# Patient Record
Sex: Female | Born: 1980
Health system: Southern US, Community
[De-identification: ages and names within clinical notes are randomized; demographics above are authoritative.]

## PROBLEM LIST (undated history)

## (undated) DIAGNOSIS — C801 Malignant (primary) neoplasm, unspecified: Secondary | ICD-10-CM

## (undated) DIAGNOSIS — F419 Anxiety disorder, unspecified: Secondary | ICD-10-CM

## (undated) DIAGNOSIS — O139 Gestational [pregnancy-induced] hypertension without significant proteinuria, unspecified trimester: Secondary | ICD-10-CM

## (undated) DIAGNOSIS — F329 Major depressive disorder, single episode, unspecified: Secondary | ICD-10-CM

## (undated) DIAGNOSIS — N200 Calculus of kidney: Secondary | ICD-10-CM

## (undated) DIAGNOSIS — F32A Depression, unspecified: Secondary | ICD-10-CM

## (undated) DIAGNOSIS — J302 Other seasonal allergic rhinitis: Secondary | ICD-10-CM

## (undated) HISTORY — DX: Calculus of kidney: N20.0

## (undated) HISTORY — DX: Depression, unspecified: F32.A

## (undated) HISTORY — DX: Major depressive disorder, single episode, unspecified: F32.9

## (undated) HISTORY — DX: Depression, unspecified: F41.9

## (undated) HISTORY — DX: Malignant (primary) neoplasm, unspecified: C80.1

## (undated) HISTORY — DX: Other seasonal allergic rhinitis: J30.2

---

## 2003-01-12 ENCOUNTER — Other Ambulatory Visit: Admission: RE | Admit: 2003-01-12 | Discharge: 2003-01-12 | Payer: Self-pay | Admitting: Obstetrics and Gynecology

## 2003-09-27 ENCOUNTER — Other Ambulatory Visit: Admission: RE | Admit: 2003-09-27 | Discharge: 2003-09-27 | Payer: Self-pay | Admitting: Obstetrics and Gynecology

## 2004-02-27 ENCOUNTER — Other Ambulatory Visit: Admission: RE | Admit: 2004-02-27 | Discharge: 2004-02-27 | Payer: Self-pay | Admitting: Obstetrics and Gynecology

## 2005-04-23 ENCOUNTER — Other Ambulatory Visit: Admission: RE | Admit: 2005-04-23 | Discharge: 2005-04-23 | Payer: Self-pay | Admitting: Obstetrics and Gynecology

## 2006-03-08 ENCOUNTER — Other Ambulatory Visit: Admission: RE | Admit: 2006-03-08 | Discharge: 2006-03-08 | Payer: Self-pay | Admitting: Obstetrics & Gynecology

## 2006-04-29 ENCOUNTER — Emergency Department (HOSPITAL_COMMUNITY): Admission: EM | Admit: 2006-04-29 | Discharge: 2006-04-29 | Payer: Self-pay | Admitting: Family Medicine

## 2009-02-26 LAB — CONVERTED CEMR LAB

## 2010-01-31 ENCOUNTER — Ambulatory Visit: Payer: Self-pay | Admitting: Internal Medicine

## 2010-01-31 DIAGNOSIS — Z87442 Personal history of urinary calculi: Secondary | ICD-10-CM | POA: Insufficient documentation

## 2010-01-31 DIAGNOSIS — J309 Allergic rhinitis, unspecified: Secondary | ICD-10-CM | POA: Insufficient documentation

## 2010-01-31 DIAGNOSIS — R10814 Left lower quadrant abdominal tenderness: Secondary | ICD-10-CM | POA: Insufficient documentation

## 2010-01-31 LAB — CONVERTED CEMR LAB
ALT: 17 units/L (ref 0–35)
AST: 22 units/L (ref 0–37)
Albumin: 3.9 g/dL (ref 3.5–5.2)
Alkaline Phosphatase: 58 units/L (ref 39–117)
BUN: 18 mg/dL (ref 6–23)
Basophils Absolute: 0 10*3/uL (ref 0.0–0.1)
Basophils Relative: 0.3 % (ref 0.0–3.0)
Bilirubin Urine: NEGATIVE
Bilirubin, Direct: 0 mg/dL (ref 0.0–0.3)
CO2: 28 meq/L (ref 19–32)
Calcium: 9.4 mg/dL (ref 8.4–10.5)
Chloride: 105 meq/L (ref 96–112)
Cholesterol: 169 mg/dL (ref 0–200)
Creatinine, Ser: 0.9 mg/dL (ref 0.4–1.2)
Eosinophils Absolute: 0.1 10*3/uL (ref 0.0–0.7)
Eosinophils Relative: 1.2 % (ref 0.0–5.0)
GFR calc non Af Amer: 83.71 mL/min (ref >60)
Glucose, Bld: 86 mg/dL (ref 70–99)
HCT: 40.6 % (ref 36.0–46.0)
HDL: 78.9 mg/dL (ref >39.00)
Hemoglobin: 14.4 g/dL (ref 12.0–15.0)
Ketones, ur: NEGATIVE mg/dL
LDL Cholesterol: 71 mg/dL (ref 0–99)
Leukocytes, UA: NEGATIVE
Lymphocytes Relative: 19.9 % (ref 12.0–46.0)
Lymphs Abs: 1.6 10*3/uL (ref 0.7–4.0)
MCHC: 35.4 g/dL (ref 30.0–36.0)
MCV: 88.7 fL (ref 78.0–100.0)
Monocytes Absolute: 0.5 10*3/uL (ref 0.1–1.0)
Monocytes Relative: 6.6 % (ref 3.0–12.0)
Neutro Abs: 5.9 10*3/uL (ref 1.4–7.7)
Neutrophils Relative %: 72 % (ref 43.0–77.0)
Nitrite: NEGATIVE
Platelets: 200 10*3/uL (ref 150.0–400.0)
Potassium: 4.5 meq/L (ref 3.5–5.1)
RBC: 4.58 M/uL (ref 3.87–5.11)
RDW: 12.5 % (ref 11.5–14.6)
Sodium: 140 meq/L (ref 135–145)
Specific Gravity, Urine: 1.025 (ref 1.000–1.030)
TSH: 1.32 microintl units/mL (ref 0.35–5.50)
Total Bilirubin: 0.3 mg/dL (ref 0.3–1.2)
Total CHOL/HDL Ratio: 2
Total Protein, Urine: NEGATIVE mg/dL
Total Protein: 7.4 g/dL (ref 6.0–8.3)
Triglycerides: 96 mg/dL (ref 0.0–149.0)
Urine Glucose: NEGATIVE mg/dL
Urobilinogen, UA: 0.2 (ref 0.0–1.0)
VLDL: 19.2 mg/dL (ref 0.0–40.0)
WBC: 8.2 10*3/uL (ref 4.5–10.5)
pH: 6 (ref 5.0–8.0)

## 2010-02-05 ENCOUNTER — Encounter: Payer: Self-pay | Admitting: Internal Medicine

## 2010-05-16 ENCOUNTER — Encounter: Admission: RE | Admit: 2010-05-16 | Discharge: 2010-05-16 | Payer: Self-pay | Admitting: General Surgery

## 2010-07-29 ENCOUNTER — Ambulatory Visit: Payer: Self-pay | Admitting: Internal Medicine

## 2010-07-29 ENCOUNTER — Telehealth: Payer: Self-pay | Admitting: Internal Medicine

## 2010-07-29 DIAGNOSIS — M25539 Pain in unspecified wrist: Secondary | ICD-10-CM | POA: Insufficient documentation

## 2010-07-29 LAB — CONVERTED CEMR LAB: Sed Rate: 10 mm/hr (ref 0–22)

## 2010-07-30 ENCOUNTER — Ambulatory Visit: Payer: Self-pay | Admitting: Internal Medicine

## 2010-07-30 LAB — CONVERTED CEMR LAB: Rhuematoid fact SerPl-aCnc: <20 intl units/mL (ref 0–20)

## 2010-08-19 ENCOUNTER — Encounter: Payer: Self-pay | Admitting: Internal Medicine

## 2010-10-28 ENCOUNTER — Encounter: Payer: Self-pay | Admitting: Internal Medicine

## 2010-10-30 NOTE — Letter (Signed)
Summary: [facility]   Imported By: Lester Marmaduke 02/17/2010 11:18:26  _____________________________________________________________________  External Attachment:    Type:   Image     Comment:   External Document

## 2010-10-30 NOTE — Assessment & Plan Note (Signed)
Summary: ARTHRITIC PAIN/#/CD   Vital Signs:  Patient profile:   30 year old female Height:      62 inches (157.48 cm) Weight:      137.4 pounds (62.45 kg) O2 Sat:      97 % on Room air Temp:     98.7 degrees F (37.06 degrees C) oral Pulse rate:   86 / minute BP sitting:   110 / 78  (left arm) Cuff size:   regular  Vitals Entered By: Orlan Leavens RMA (July 29, 2010 9:17 AM)  O2 Flow:  Room air CC: Hands & feet pain Is Patient Diabetic? No Pain Assessment Patient in pain? yes     Location: handf & feet Type: aching Onset of pain  Pt states since August been having ongoing pain in her hans & feet. Have family history of RA want to be check   Primary Care Provider:  Newt Lukes MD  CC:  Hands & feet pain.  History of Present Illness: c/o B feet pain and wrist R>L (right handed) no hx trauma or injury to any of these areas onset 3 months ago - pain worse in AM x 30 min, then spont resolve desribes as sore all over, not esp due to weight bearing status or activity no swelling or redness but "numb and weak" feeling +FH AS and RA - would like labs to check for same  also c/o sores on nostrils - occurs approx once per month -  last episode 2 weeks ago - none at this time using nasal spray for allg w/o change - no other nose medications  Current Medications (verified): 1)  Allergy Injections .... Take 1 Shot Q Week 2)  Bupropion Hcl 100 Mg Tabs (Bupropion Hcl) .... Take 1/2 By Mouth Once Daily 3)  Yaz 3-0.02 Mg Tabs (Drospirenone-Ethinyl Estradiol) .... Take 1 By Mouth Qd 4)  Flonase 50 Mcg/act Susp (Fluticasone Propionate) .Marland Kitchen.. 1 Spray Each Nostril Qd 5)  Prozac 10 Mg Caps (Fluoxetine Hcl) .... 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    Physical Exam  General:  alert, well-developed, well-nourished, and cooperative to examination.    Msk:  no deformities of B hands/wrists or ankles/toes - no arthritis changes, synovitis or redness - nontender to palp - FROM in all joints without pain - neurovasc intact   Impression & Recommendations:  Problem # 1:  WRIST PAIN, RIGHT (ICD-719.43) right handed but pain symptoms affects both wrists and both feet pain each AM x 3 mo with +FH RA and AS (mom with wrist fusion in 40s due to AS) no trauma or obvious abn on exam - check xray and labs now - if these normal, suggest wrist plpint at bedtime for poss carpal tunnel - likewise, feet symptoms likely plantar fascitis -  to call if symptoms worse depsite tx for other eval as needed - pt understands and agrees to same Orders: T-Wrist Comp Right (73110TC) TLB-Sedimentation Rate (  ESR) (85652-ESR) T-Rheumatoid Factor (51884-16606)  Problem # 2:  ALLERGIC RHINITIS (ICD-477.9) no nasal ulcertion or sores at this time - ?dry MM - rec nasal saline and to call if problems while lesion prsent Her updated medication list for this problem includes:    Flonase 50 Mcg/act Susp (Fluticasone propionate) .Marland Kitchen... 1 spray each nostril qd  Complete Medication List: 1)  Allergy Injections  .... Take 1 shot q week 2)  Bupropion Hcl 100 Mg Tabs (Bupropion hcl) .... Take 1/2 by mouth once daily 3)  Yaz 3-0.02 Mg Tabs (Drospirenone-ethinyl estradiol) .... Take 1 by mouth qd 4)  Flonase 50 Mcg/act Susp (Fluticasone propionate) .Marland Kitchen.. 1 spray each nostril qd 5)  Prozac 10 Mg Caps (Fluoxetine hcl) ....  Take 1 by mouth qd  Patient Instructions: 1)  it was good to see you today. 2)  test(s) ordered today - your results will becalled to you after review in 48-72 hours from the time of test completion 3)  if labs and xrays without obvious abnormality, try wrist splint at bedtime as dicsussed for possible carpal tunnel syndrome symptoms  4)  if continued pain despite same, call for reevaluation as needed 5)  use nasal saline for nose sores (to keep moist) in addition to allergy medication spray 6)  Please schedule a follow-up appointment as needed.   Orders Added: 1)  T-Wrist Comp Right [73110TC] 2)  TLB-Sedimentation Rate (ESR) [85652-ESR] 3)  T-Rheumatoid Factor [30160-10932] 4)  Est. Patient Level IV [35573]

## 2010-10-30 NOTE — Progress Notes (Signed)
Summary: lab orders  Phone Note Outgoing Call   Call placed by: Orlan Leavens RMA,  July 29, 2010 11:04 AM Call placed to: pt Details for Reason: FYI Summary of Call: Contacted lab to see if pt did get Rheumatoid factor labs drawn, per Gerarda Gunther only did SED. Called pt to let her know she will have to come back in to have  lab for rheumatoid factor drawn. Pt states ok will try to come back later or in the am. Put order in IDX Initial call taken by: Orlan Leavens RMA,  July 29, 2010 11:07 AM  Follow-up for Phone Call        noted - thanks Follow-up by: Newt Lukes MD,  July 29, 2010 12:15 PM

## 2010-10-30 NOTE — Letter (Signed)
Summary: Consuela Mimes MD  Consuela Mimes MD   Imported By: Lennie Odor 09/03/2010 15:38:57  _____________________________________________________________________  External Attachment:    Type:   Image     Comment:   External Document

## 2010-10-30 NOTE — Assessment & Plan Note (Signed)
Summary: new / bcbs / # / cd   Vital Signs:  Patient profile:   30 year old female Height:      62 inches (157.48 cm) Weight:      128.12 pounds (58.24 kg) BMI:     23.52 O2 Sat:      95 % on Room air Temp:     98.7 degrees F (37.06 degrees C) oral Pulse rate:   78 / minute BP sitting:   120 / 88  (left arm) Cuff size:   regular  Vitals Entered By: Orlan Leavens (Jan 31, 2010 8:11 AM)  O2 Flow:  Room air CC: New patient Is Patient Diabetic? No Pain Assessment Patient in pain? no        Primary Care Provider:  Newt Lukes MD  CC:  New patient.  History of Present Illness: new pt to me and our practice, here to est care  also, patient is here today for annual physical. Patient feels well and has no complaints.      Preventive Screening-Counseling & Management  Alcohol-Tobacco     Alcohol drinks/day: <1     Alcohol Counseling: not indicated; use of alcohol is not excessive or problematic     Smoking Status: never     Tobacco Counseling: not indicated; no tobacco use  Caffeine-Diet-Exercise     Nutrition Referrals: no     Does Patient Exercise: yes     Exercise Counseling: not indicated; exercise is adequate     Depression Counseling: not indicated; screening negative for depression  Safety-Violence-Falls     Seat Belt Counseling: not indicated; patient wears seat belts     Helmet Counseling: not indicated; patient wears helmet when riding bicycle/motocycle     Firearm Counseling: not applicable     Smoke Detectors: yes     Violence Counseling: not indicated; no violence risk noted     Fall Risk Counseling: not indicated; no significant falls noted  Clinical Review Panels:  Prevention   Last Pap Smear:  Interpretation/ Result:Negative for intraepithelial Lesion or Malignancy.    (02/26/2009)  Immunizations   Last Tetanus Booster:  Historical (09/29/2007)   Last Flu Vaccine:  Historical (06/28/2009)   Current Medications (verified): 1)  Allergy  Injections .... Take 1 Shot Q Week 2)  Bupropion Hcl 100 Mg Tabs (Bupropion Hcl) .... Take 1 By Mouth Qd 3)  Yaz 3-0.02 Mg Tabs (Drospirenone-Ethinyl Estradiol) .... Take 1 By Mouth Qd 4)  Flonase 50 Mcg/act Susp (Fluticasone Propionate) .Marland Kitchen.. 1 Spray Each Nostril Qd 5)  Prozac 10 Mg Caps (Fluoxetine Hcl) .... 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 otherwise, see HPI above. I have reviewed all other systems and they were negative.   Physical Exam  General:  alert, well-developed, well-nourished, and cooperative to examination.    Eyes:  vision grossly intact; pupils equal, round and reactive to light.  conjunctiva and lids normal.    Ears:  normal pinnae bilaterally, without erythema, swelling, or tenderness to palpation. TMs clear, without effusion, or cerumen impaction. Hearing grossly normal bilaterally  Mouth:  teeth and gums in good repair; mucous membranes moist, without lesions or ulcers. oropharynx clear without exudate, no erythema.  Neck:  supple, full ROM, no masses, no thyromegaly; no thyroid nodules or tenderness. no JVD or carotid bruits.   Lungs:  normal respiratory effort, no intercostal retractions or use of accessory muscles; normal breath sounds bilaterally - no crackles and no wheezes.    Heart:  normal rate, regular rhythm, no murmur, and no  rub. BLE without edema. normal DP pulses and normal cap refill in all 4 extremities    Abdomen:  soft, non-tender, normal bowel sounds, no distention; no masses and no appreciable hepatomegaly or splenomegaly.   Genitalia:  defer to gyn Msk:  No deformity noted - ?mild scoli with prominenet right scapular wing  -    Neurologic:  alert & oriented X3 and cranial nerves II-XII symetrically intact.  strength normal in all extremities, sensation intact to light touch, and gait normal. speech fluent without dysarthria or aphasia; follows commands with good comprehension.  Skin:  no rashes, vesicles, ulcers, or erythema. No nodules or irregularity to palpation.  Psych:  Oriented X3, memory intact for recent and remote, normally interactive, good eye contact, not anxious appearing, not depressed appearing, and not agitated.      Impression & Recommendations:  Problem # 1:  PREVENTIVE HEALTH CARE (ICD-V70.0)  Patient has been counseled on age-appropriate routine health concerns for screening and prevention. These are reviewed and up-to-date. Immunizations are up-to-date or declined. Labs ordered to be reviewed.  refer for gyn eval  Orders: TLB-Lipid Panel (80061-LIPID) TLB-BMP (Basic Metabolic Panel-BMET) (80048-METABOL) TLB-CBC Platelet - w/Differential (85025-CBCD) TLB-Hepatic/Liver Function Pnl (80076-HEPATIC) TLB-TSH (Thyroid Stimulating Hormone) (84443-TSH) TLB-Udip w/ Micro (81001-URINE) Gynecologic Referral (Gyn)  Problem # 2:  ABDOMINAL TENDERNESS, LEFT LOWER QUADRANT (BZJ-696.78) await CPX labs and gyn evla - consider need for pelvic US  Problem # 3:  DEPRESSION (ICD-311) currently seeing counselor but will rx med refills as needed  Her updated medication list for this problem includes:    Bupropion Hcl 100 Mg Tabs (Bupropion hcl) .Marland Kitchen... Take 1 by mouth qd    Prozac 10 Mg Caps (Fluoxetine hcl) .Marland Kitchen... Take 1 by mouth qd  Complete Medication List: 1)  Allergy Injections  .... Take 1  shot q week 2)  Bupropion Hcl 100 Mg Tabs (Bupropion hcl) .... Take 1 by mouth qd 3)  Yaz 3-0.02 Mg Tabs (Drospirenone-ethinyl estradiol) .... Take 1 by mouth qd 4)  Flonase 50 Mcg/act Susp (Fluticasone propionate) .Marland Kitchen.. 1 spray each nostril qd 5)  Prozac 10 Mg Caps (Fluoxetine hcl) .... Take 1 by mouth qd  Patient Instructions: 1)  it was good to see you today. 2)  test(s) ordered today - your results will be posted on the phone tree for review in 48-72 hours from the time of test completion; call [phone] and enter your 9 digit MRN (listed above on this page, just below your name); if any changes need to be made or there are abnormal results, you will be contacted directly.  3)  we'll make referral to gynecology for left ovary symptoms. Our office will contact you regarding this appointment once made.  4)  Let us know when/if refills on medications are needed 5)  Please schedule a follow-up appointment in 6-12 months to  monitor depression medications, call sooner if problems.    Pap Smear  Procedure date:  02/26/2009  Findings:      Interpretation/ Result:Negative for intraepithelial Lesion or Malignancy.       Immunization History:  Tetanus/Td Immunization History:    Tetanus/Td:  historical (09/29/2007)  Influenza Immunization History:    Influenza:  historical (06/28/2009)

## 2010-11-13 NOTE — Letter (Signed)
Summary: Anselmo Rod MD  Anselmo Rod MD   Imported By: [place] 11/07/2010 15:25:33  _____________________________________________________________________  External Attachment:    Type:   Image     Comment:   External Document

## 2010-12-27 ENCOUNTER — Encounter: Payer: Self-pay | Admitting: Internal Medicine

## 2010-12-31 ENCOUNTER — Other Ambulatory Visit (INDEPENDENT_AMBULATORY_CARE_PROVIDER_SITE_OTHER): Payer: BC Managed Care – PPO

## 2010-12-31 ENCOUNTER — Encounter: Payer: Self-pay | Admitting: Internal Medicine

## 2010-12-31 ENCOUNTER — Telehealth: Payer: Self-pay | Admitting: Internal Medicine

## 2010-12-31 ENCOUNTER — Ambulatory Visit (INDEPENDENT_AMBULATORY_CARE_PROVIDER_SITE_OTHER): Payer: BC Managed Care – PPO | Admitting: Internal Medicine

## 2010-12-31 VITALS — BP 98/72 | HR 73 | Temp 98.2°F | Ht 62.0 in | Wt 138.0 lb

## 2010-12-31 DIAGNOSIS — R109 Unspecified abdominal pain: Secondary | ICD-10-CM

## 2010-12-31 DIAGNOSIS — Z23 Encounter for immunization: Secondary | ICD-10-CM

## 2010-12-31 DIAGNOSIS — R10A1 Flank pain, right side: Secondary | ICD-10-CM

## 2010-12-31 LAB — BASIC METABOLIC PANEL
BUN: 17 mg/dL (ref 6–23)
CO2: 30 mEq/L (ref 19–32)
Calcium: 9.7 mg/dL (ref 8.4–10.5)
Chloride: 102 mEq/L (ref 96–112)
Creatinine, Ser: 0.8 mg/dL (ref 0.4–1.2)
GFR: 87.95 mL/min (ref >60.00)
Glucose, Bld: 77 mg/dL (ref 70–99)
Potassium: 4.1 mEq/L (ref 3.5–5.1)
Sodium: 140 mEq/L (ref 135–145)

## 2010-12-31 LAB — URINALYSIS
Bilirubin Urine: NEGATIVE
Hgb urine dipstick: NEGATIVE
Ketones, ur: NEGATIVE
Leukocytes, UA: NEGATIVE
Nitrite: NEGATIVE
Specific Gravity, Urine: 1.01 (ref 1.000–1.030)
Total Protein, Urine: NEGATIVE
Urine Glucose: NEGATIVE
Urobilinogen, UA: 0.2 (ref 0.0–1.0)
pH: 7 (ref 5.0–8.0)

## 2010-12-31 LAB — CBC WITH DIFFERENTIAL/PLATELET
Basophils Absolute: 0 10*3/uL (ref 0.0–0.1)
Basophils Relative: 0.4 % (ref 0.0–3.0)
Eosinophils Absolute: 0.1 10*3/uL (ref 0.0–0.7)
Eosinophils Relative: 2.3 % (ref 0.0–5.0)
HCT: 40.3 % (ref 36.0–46.0)
Hemoglobin: 13.9 g/dL (ref 12.0–15.0)
Lymphocytes Relative: 36.8 % (ref 12.0–46.0)
Lymphs Abs: 1.8 10*3/uL (ref 0.7–4.0)
MCHC: 34.6 g/dL (ref 30.0–36.0)
MCV: 90.5 fl (ref 78.0–100.0)
Monocytes Absolute: 0.5 10*3/uL (ref 0.1–1.0)
Monocytes Relative: 9.6 % (ref 3.0–12.0)
Neutro Abs: 2.4 10*3/uL (ref 1.4–7.7)
Neutrophils Relative %: 50.9 % (ref 43.0–77.0)
Platelets: 172 10*3/uL (ref 150.0–400.0)
RBC: 4.45 Mil/uL (ref 3.87–5.11)
RDW: 12.6 % (ref 11.5–14.6)
WBC: 4.8 10*3/uL (ref 4.5–10.5)

## 2010-12-31 MED ORDER — IBUPROFEN 200 MG PO TABS: 400.0000 mg | ORAL_TABLET | Freq: Four times a day (QID) | ORAL | Status: AC | PRN

## 2010-12-31 NOTE — Telephone Encounter (Signed)
Called pt no ansew left msg on personal cell md response concerning labs...12/31/10@3 :22pm/LMB

## 2010-12-31 NOTE — Telephone Encounter (Signed)
Please call patient - normal results, no blood in urine or evidence for infx  - Await CT results and final UCx, will call again once those results reviewed. No medication changes recommended. Thanks.

## 2010-12-31 NOTE — Progress Notes (Signed)
Subjective:    Patient ID: Alison [name], female    DOB: [birth year], 30 y.o.   MRN: [id number]  HPI the patient complains of R flank pain Onset 6 weeks ago No radiation of pain No precipitating injury, overuse or trauma +hx same associated with with kidney stone Seen in urg care at time of onset due to severity of pain - blood in urine, presumed kidney stone Improved pain with ibuprofen but not resolved symptoms worse with inactivity and laying in bed No weakness, no bowel change, no fever, no dysuria or hematuria  Past Medical History  Diagnosis Date  . DEPRESSION 01/31/2010  . ALLERGIC RHINITIS 01/31/2010  . RENAL CALCULUS, HX OF 01/31/2010   Review of Systems  Constitutional: Negative for fever, chills and fatigue.  Respiratory: Negative for cough.   Cardiovascular: Negative for chest pain.  Genitourinary: Positive for flank pain. Negative for dysuria, frequency, hematuria, difficulty urinating and menstrual problem.      BP 98/72  Pulse 73  Temp(Src) 98.2 F (36.8 C) (Oral)  Ht 5\' 2"  (1.575 m)  Wt 138 lb (62.596 kg)  BMI 25.24 kg/m2  Objective:   Physical Exam  Constitutional: She appears well-developed and well-nourished. No distress.  Cardiovascular: Normal rate, regular rhythm and normal heart sounds.  Exam reveals no friction rub.   No murmur heard. Pulmonary/Chest: Effort normal and breath sounds normal. No respiratory distress. She has no wheezes. She exhibits no tenderness.  Abdominal: Soft. Bowel sounds are normal. She exhibits no distension. There is no tenderness.  Musculoskeletal:       Back: full range of motion of thoracic and lumbar spine. Min tender to palpation over R flank. Negative straight leg raise. DTR's are symmetrically intact. Sensation intact in all dermatomes of the lower extremities. Full strength to manual muscle testing including the EHL, anterior tibialis, gastrocnemius, quadricepts, and iliopsoas. the patient is able to heel toe walk without  difficulty and ambulates with a normal gait.      Lab Results  Component Value Date   WBC 8.2 01/31/2010   HGB 14.4 01/31/2010   HGB TRACE-INTACT 01/31/2010   HCT 40.6 01/31/2010   PLT 200.0 01/31/2010   CHOL 169 01/31/2010   TRIG 96.0 01/31/2010   HDL 78.90 01/31/2010   ALT 17 01/31/2010   AST 22 01/31/2010   NA 140 01/31/2010   K 4.5 01/31/2010   CL 105 01/31/2010   CREATININE 0.9 01/31/2010   BUN 18 01/31/2010   CO2 28 01/31/2010   TSH 1.32 01/31/2010   Assessment & Plan:  See problem list. Medications and labs reviewed today.

## 2010-12-31 NOTE — Patient Instructions (Addendum)
It was good to see you today. Test(s) ordered today. Your results will be called to you after review (48-72hours after test completion). If any changes need to be made, you will be notified at that time. we'll make referral for CT scan to look for kidney stones or other problems. Our office will contact you regarding appointment(s) once made. Alternate between ibuprofen and tylenol for aches and pain symptoms as discussed: Dose yourself every 4-6 hours using 400-600 mg ibuprofen alternating with 1000 mg acetaminophen  Tdap given today

## 2010-12-31 NOTE — Assessment & Plan Note (Signed)
Pain symptoms >6weeks - prior kidney stone and recent hematuria Check UA/Ucx and CT now r/o infx or stone or other problem Continue ibuprofen pending clarification of other possible dx

## 2011-01-01 LAB — URINE CULTURE
Colony Count: NO GROWTH
Organism ID, Bacteria: NO GROWTH

## 2011-01-05 ENCOUNTER — Telehealth: Payer: Self-pay | Admitting: Internal Medicine

## 2011-01-05 ENCOUNTER — Ambulatory Visit (INDEPENDENT_AMBULATORY_CARE_PROVIDER_SITE_OTHER)
Admission: RE | Admit: 2011-01-05 | Discharge: 2011-01-05 | Disposition: A | Discharge status: Home / Self Care | Payer: Federal, State, Local not specified - PPO | Source: Ambulatory Visit | Attending: Internal Medicine | Admitting: Internal Medicine

## 2011-01-05 DIAGNOSIS — R109 Unspecified abdominal pain: Secondary | ICD-10-CM

## 2011-01-05 NOTE — Assessment & Plan Note (Signed)
No stone of abnormality on ct a/p -

## 2011-01-05 NOTE — Telephone Encounter (Signed)
Please call patient - normal CT results - no stone or back or kidney problems evident. No medication changes recommended. Call if recurrent or worsening pain symptoms - Thanks.

## 2011-01-05 NOTE — Telephone Encounter (Signed)
Pt Notified with CT results...01/05/11@11 :54am/LMB

## 2011-05-08 LAB — GC/CHLAMYDIA PROBE AMP, GENITAL
Chlamydia: NEGATIVE
Gonorrhea: NEGATIVE

## 2011-05-08 LAB — HIV ANTIBODY (ROUTINE TESTING W REFLEX): HIV: NONREACTIVE

## 2011-05-08 LAB — STREP B DNA PROBE: GBS: NEGATIVE

## 2011-05-08 LAB — RPR: RPR: NONREACTIVE

## 2011-05-08 LAB — RUBELLA ANTIBODY, IGM: Rubella: IMMUNE

## 2011-05-08 LAB — ANTIBODY SCREEN: Antibody Screen: NEGATIVE

## 2011-05-08 LAB — VARICELLA ZOSTER ANTIBODY, IGG: Varicella: IMMUNE

## 2011-05-08 LAB — ABO/RH: RH Type: POSITIVE

## 2011-05-08 LAB — HEPATITIS B SURFACE ANTIGEN: Hepatitis B Surface Ag: NEGATIVE

## 2011-09-29 NOTE — L&D Delivery Note (Signed)
Delivery Note   C/C/+2 at 2008 Onset of active second stage at same time.  FHR cat I thorough 2nd stage  At 8:57 PM a viable female was delivered via Vaginal, Spontaneous Delivery (Presentation: OA to ROT ;  ).  APGAR: 6, 9; weight 7 lb 15.7 oz (3620 g).   Placenta status: Intact, Spontaneous, to pathology for gest HTN.   Cord: 3 vessels with the following complications: nuchal cord loose x 1, reduced .  Cord pH: none  Anesthesia: Epidural  Episiotomy: None Lacerations: 2nd degree;Perineal, bilateral labial Suture Repair: 3.0 vicryl rapide and 4.0 vicryl Est. Blood Loss (mL): 350  Mom to postpartum.  Baby to mother.  Alison [name] 12/26/2011, 9:42 PM

## 2011-11-27 DIAGNOSIS — O139 Gestational [pregnancy-induced] hypertension without significant proteinuria, unspecified trimester: Secondary | ICD-10-CM

## 2011-11-27 HISTORY — DX: Gestational (pregnancy-induced) hypertension without significant proteinuria, unspecified trimester: O13.9

## 2011-12-22 ENCOUNTER — Inpatient Hospital Stay (HOSPITAL_COMMUNITY)
Admission: AD | Admit: 2011-12-22 | Payer: Federal, State, Local not specified - PPO | Source: Ambulatory Visit | Admitting: Obstetrics and Gynecology

## 2011-12-26 ENCOUNTER — Inpatient Hospital Stay (HOSPITAL_COMMUNITY): Payer: Federal, State, Local not specified - PPO | Admitting: Anesthesiology

## 2011-12-26 ENCOUNTER — Encounter (HOSPITAL_COMMUNITY): Payer: Self-pay | Admitting: Anesthesiology

## 2011-12-26 ENCOUNTER — Encounter (HOSPITAL_COMMUNITY): Payer: Self-pay

## 2011-12-26 ENCOUNTER — Inpatient Hospital Stay (HOSPITAL_COMMUNITY)
Admission: RE | Admit: 2011-12-26 | Discharge: 2011-12-28 | DRG: 372 | Disposition: A | Discharge status: Home / Self Care | Payer: Federal, State, Local not specified - PPO | Source: Ambulatory Visit | Attending: Obstetrics & Gynecology | Admitting: Obstetrics & Gynecology

## 2011-12-26 VITALS — BP 128/89 | HR 91 | Temp 98.6°F | Resp 20 | Ht 62.0 in | Wt 180.0 lb

## 2011-12-26 DIAGNOSIS — O139 Gestational [pregnancy-induced] hypertension without significant proteinuria, unspecified trimester: Secondary | ICD-10-CM

## 2011-12-26 HISTORY — DX: Gestational (pregnancy-induced) hypertension without significant proteinuria, unspecified trimester: O13.9

## 2011-12-26 LAB — CBC
HCT: 36.9 % (ref 36.0–46.0)
HCT: 37.1 % (ref 36.0–46.0)
HCT: 33.8 % — ABNORMAL LOW (ref 36.0–46.0)
Hemoglobin: 12.8 g/dL (ref 12.0–15.0)
Hemoglobin: 12.8 g/dL (ref 12.0–15.0)
Hemoglobin: 11.8 g/dL — ABNORMAL LOW (ref 12.0–15.0)
MCH: 31.7 pg (ref 26.0–34.0)
MCH: 31.7 pg (ref 26.0–34.0)
MCH: 31.9 pg (ref 26.0–34.0)
MCHC: 34.7 g/dL (ref 30.0–36.0)
MCHC: 34.5 g/dL (ref 30.0–36.0)
MCHC: 34.9 g/dL (ref 30.0–36.0)
MCV: 91.3 fL (ref 78.0–100.0)
MCV: 91.8 fL (ref 78.0–100.0)
MCV: 91.4 fL (ref 78.0–100.0)
Platelets: 133 10*3/uL — ABNORMAL LOW (ref 150–400)
Platelets: 132 10*3/uL — ABNORMAL LOW (ref 150–400)
Platelets: 128 10*3/uL — ABNORMAL LOW (ref 150–400)
RBC: 4.04 MIL/uL (ref 3.87–5.11)
RBC: 4.04 MIL/uL (ref 3.87–5.11)
RBC: 3.7 MIL/uL — ABNORMAL LOW (ref 3.87–5.11)
RDW: 13.5 % (ref 11.5–15.5)
RDW: 13.6 % (ref 11.5–15.5)
RDW: 13.7 % (ref 11.5–15.5)
WBC: 10.6 10*3/uL — ABNORMAL HIGH (ref 4.0–10.5)
WBC: 15.5 10*3/uL — ABNORMAL HIGH (ref 4.0–10.5)
WBC: 16.2 10*3/uL — ABNORMAL HIGH (ref 4.0–10.5)

## 2011-12-26 LAB — COMPREHENSIVE METABOLIC PANEL
ALT: 25 U/L (ref 0–35)
AST: 31 U/L (ref 0–37)
Albumin: 2.8 g/dL — ABNORMAL LOW (ref 3.5–5.2)
Alkaline Phosphatase: 172 U/L — ABNORMAL HIGH (ref 39–117)
BUN: 13 mg/dL (ref 6–23)
CO2: 20 mEq/L (ref 19–32)
Calcium: 9.6 mg/dL (ref 8.4–10.5)
Chloride: 104 mEq/L (ref 96–112)
Creatinine, Ser: 0.83 mg/dL (ref 0.50–1.10)
GFR calc Af Amer: >90 mL/min (ref >90)
GFR calc non Af Amer: >90 mL/min (ref >90)
Glucose, Bld: 93 mg/dL (ref 70–99)
Potassium: 4.1 mEq/L (ref 3.5–5.1)
Sodium: 137 mEq/L (ref 135–145)
Total Bilirubin: 0.2 mg/dL — ABNORMAL LOW (ref 0.3–1.2)
Total Protein: 6 g/dL (ref 6.0–8.3)

## 2011-12-26 LAB — RPR: RPR Ser Ql: NONREACTIVE

## 2011-12-26 LAB — URIC ACID: Uric Acid, Serum: 4.7 mg/dL (ref 2.4–7.0)

## 2011-12-26 LAB — ABO/RH: ABO/RH(D): O POS

## 2011-12-26 MED ORDER — DIBUCAINE 1 % RE OINT
1.0000 "application " | TOPICAL_OINTMENT | RECTAL | Status: DC | PRN
  Filled 2011-12-26: qty 28

## 2011-12-26 MED ORDER — TETANUS-DIPHTH-ACELL PERTUSSIS 5-2.5-18.5 LF-MCG/0.5 IM SUSP: 0.5000 mL | Freq: Once | INTRAMUSCULAR | Status: DC

## 2011-12-26 MED ORDER — LACTATED RINGERS IV SOLN
500.0000 mL | Freq: Once | INTRAVENOUS | Status: AC
  Administered 2011-12-26: 15:00:00 via INTRAVENOUS

## 2011-12-26 MED ORDER — FENTANYL 2.5 MCG/ML BUPIVACAINE 1/10 % EPIDURAL INFUSION (WH - ANES)
14.0000 mL/h | INTRAMUSCULAR | Status: DC
  Administered 2011-12-26: 14 mL/h via EPIDURAL
  Filled 2011-12-26 (×2): qty 60

## 2011-12-26 MED ORDER — OXYTOCIN 20 UNITS IN LACTATED RINGERS INFUSION - SIMPLE: 125.0000 mL/h | Freq: Once | INTRAVENOUS | Status: DC

## 2011-12-26 MED ORDER — ZOLPIDEM TARTRATE 5 MG PO TABS: 5.0000 mg | ORAL_TABLET | Freq: Every evening | ORAL | Status: DC | PRN

## 2011-12-26 MED ORDER — IBUPROFEN 600 MG PO TABS: 600.0000 mg | ORAL_TABLET | Freq: Four times a day (QID) | ORAL | Status: DC | PRN

## 2011-12-26 MED ORDER — PHENYLEPHRINE 40 MCG/ML (10ML) SYRINGE FOR IV PUSH (FOR BLOOD PRESSURE SUPPORT)
80.0000 ug | PREFILLED_SYRINGE | INTRAVENOUS | Status: DC | PRN
  Filled 2011-12-26: qty 5

## 2011-12-26 MED ORDER — TERBUTALINE SULFATE 1 MG/ML IJ SOLN: 0.2500 mg | Freq: Once | INTRAMUSCULAR | Status: DC | PRN

## 2011-12-26 MED ORDER — BISACODYL 10 MG RE SUPP: 10.0000 mg | Freq: Every day | RECTAL | Status: DC | PRN

## 2011-12-26 MED ORDER — ONDANSETRON HCL 4 MG PO TABS: 4.0000 mg | ORAL_TABLET | ORAL | Status: DC | PRN

## 2011-12-26 MED ORDER — OXYCODONE-ACETAMINOPHEN 5-325 MG PO TABS: 1.0000 | ORAL_TABLET | ORAL | Status: DC | PRN

## 2011-12-26 MED ORDER — DIPHENHYDRAMINE HCL 50 MG/ML IJ SOLN: 12.5000 mg | INTRAMUSCULAR | Status: DC | PRN

## 2011-12-26 MED ORDER — FENTANYL 2.5 MCG/ML BUPIVACAINE 1/10 % EPIDURAL INFUSION (WH - ANES)
INTRAMUSCULAR | Status: DC | PRN
  Administered 2011-12-26: 14 mL/h via EPIDURAL

## 2011-12-26 MED ORDER — FLEET ENEMA 7-19 GM/118ML RE ENEM: 1.0000 | ENEMA | RECTAL | Status: DC | PRN

## 2011-12-26 MED ORDER — FLEET ENEMA 7-19 GM/118ML RE ENEM: 1.0000 | ENEMA | Freq: Every day | RECTAL | Status: DC | PRN

## 2011-12-26 MED ORDER — IBUPROFEN 600 MG PO TABS
600.0000 mg | ORAL_TABLET | Freq: Four times a day (QID) | ORAL | Status: DC
  Administered 2011-12-27 – 2011-12-28 (×6): 600 mg via ORAL
  Filled 2011-12-26 (×6): qty 1

## 2011-12-26 MED ORDER — SIMETHICONE 80 MG PO CHEW: 80.0000 mg | CHEWABLE_TABLET | ORAL | Status: DC | PRN

## 2011-12-26 MED ORDER — EPHEDRINE 5 MG/ML INJ
10.0000 mg | INTRAVENOUS | Status: DC | PRN
  Filled 2011-12-26: qty 4

## 2011-12-26 MED ORDER — PRENATAL MULTIVITAMIN CH
1.0000 | ORAL_TABLET | Freq: Every day | ORAL | Status: DC
  Administered 2011-12-27: 1 via ORAL
  Filled 2011-12-26 (×2): qty 1

## 2011-12-26 MED ORDER — OXYTOCIN 20 UNITS IN LACTATED RINGERS INFUSION - SIMPLE
1.0000 m[IU]/min | INTRAVENOUS | Status: DC
  Administered 2011-12-26: 2 m[IU]/min via INTRAVENOUS
  Administered 2011-12-26: 999 m[IU]/min via INTRAVENOUS
  Filled 2011-12-26 (×2): qty 1000

## 2011-12-26 MED ORDER — BENZOCAINE-MENTHOL 20-0.5 % EX AERO
1.0000 "application " | INHALATION_SPRAY | CUTANEOUS | Status: DC | PRN
  Administered 2011-12-26: 1 via TOPICAL

## 2011-12-26 MED ORDER — OXYTOCIN BOLUS FROM INFUSION
500.0000 mL | Freq: Once | INTRAVENOUS | Status: DC
  Filled 2011-12-26: qty 500

## 2011-12-26 MED ORDER — LIDOCAINE HCL (PF) 1 % IJ SOLN
30.0000 mL | INTRAMUSCULAR | Status: DC | PRN
  Filled 2011-12-26: qty 30

## 2011-12-26 MED ORDER — LACTATED RINGERS IV SOLN
500.0000 mL | INTRAVENOUS | Status: DC | PRN
  Administered 2011-12-26: 1000 mL via INTRAVENOUS

## 2011-12-26 MED ORDER — ONDANSETRON HCL 4 MG/2ML IJ SOLN: 4.0000 mg | INTRAMUSCULAR | Status: DC | PRN

## 2011-12-26 MED ORDER — SENNOSIDES-DOCUSATE SODIUM 8.6-50 MG PO TABS
2.0000 | ORAL_TABLET | Freq: Every day | ORAL | Status: DC
  Administered 2011-12-27: 2 via ORAL

## 2011-12-26 MED ORDER — ZOLPIDEM TARTRATE 10 MG PO TABS: 10.0000 mg | ORAL_TABLET | Freq: Every evening | ORAL | Status: DC | PRN

## 2011-12-26 MED ORDER — NALBUPHINE SYRINGE 5 MG/0.5 ML: 10.0000 mg | INJECTION | INTRAMUSCULAR | Status: DC | PRN

## 2011-12-26 MED ORDER — ACETAMINOPHEN 325 MG PO TABS
650.0000 mg | ORAL_TABLET | ORAL | Status: DC | PRN
  Administered 2011-12-26: 650 mg via ORAL
  Filled 2011-12-26: qty 2

## 2011-12-26 MED ORDER — DIPHENHYDRAMINE HCL 25 MG PO CAPS: 25.0000 mg | ORAL_CAPSULE | Freq: Four times a day (QID) | ORAL | Status: DC | PRN

## 2011-12-26 MED ORDER — LANOLIN HYDROUS EX OINT: TOPICAL_OINTMENT | CUTANEOUS | Status: DC | PRN

## 2011-12-26 MED ORDER — EPHEDRINE 5 MG/ML INJ: 10.0000 mg | INTRAVENOUS | Status: DC | PRN

## 2011-12-26 MED ORDER — WITCH HAZEL-GLYCERIN EX PADS: 1.0000 "application " | MEDICATED_PAD | CUTANEOUS | Status: DC | PRN

## 2011-12-26 MED ORDER — OXYTOCIN 10 UNIT/ML IJ SOLN: 10.0000 [IU] | Freq: Once | INTRAMUSCULAR | Status: DC

## 2011-12-26 MED ORDER — CITRIC ACID-SODIUM CITRATE 334-500 MG/5ML PO SOLN: 30.0000 mL | ORAL | Status: DC | PRN

## 2011-12-26 MED ORDER — ONDANSETRON HCL 4 MG/2ML IJ SOLN
4.0000 mg | Freq: Four times a day (QID) | INTRAMUSCULAR | Status: DC | PRN
  Filled 2011-12-26: qty 2

## 2011-12-26 MED ORDER — LABETALOL HCL 100 MG PO TABS
100.0000 mg | ORAL_TABLET | Freq: Three times a day (TID) | ORAL | Status: DC
  Administered 2011-12-26: 100 mg via ORAL
  Filled 2011-12-26 (×4): qty 1

## 2011-12-26 MED ORDER — PHENYLEPHRINE 40 MCG/ML (10ML) SYRINGE FOR IV PUSH (FOR BLOOD PRESSURE SUPPORT): 80.0000 ug | PREFILLED_SYRINGE | INTRAVENOUS | Status: DC | PRN

## 2011-12-26 MED ORDER — BENZOCAINE-MENTHOL 20-0.5 % EX AERO
INHALATION_SPRAY | CUTANEOUS | Status: AC
Start: 2011-12-26 — End: 2011-12-26
  Administered 2011-12-26: 1 via TOPICAL
  Filled 2011-12-26: qty 56

## 2011-12-26 MED ORDER — LIDOCAINE HCL (PF) 1 % IJ SOLN
INTRAMUSCULAR | Status: DC | PRN
  Administered 2011-12-26 (×2): 8 mL

## 2011-12-26 NOTE — Progress Notes (Signed)
S: Doing well, pain with ctx rates at 5, coping well, denies PEC s/s.  Epidural discussed, benefits of reducing BP. Has had one dose labetalol 100 mg PO.   O: Filed Vitals:   12/26/11 1009 12/26/11 1058 12/26/11 1126 12/26/11 1203  BP: 145/103 136/93 126/91 150/103  Pulse: 84 88 91 86  Temp:    98.5 F (36.9 C)  TempSrc:    Oral  Resp: 18 20 18 18   Height:      Weight:         FHT:  FHR: 135 bpm, variability: moderate,  accelerations:  Present,  decelerations:  Absent UC:   regular, every 2-3 minutes SVE:   Dilation: 3 Effacement (%): 90 Station: -1 Exam by:: Suheyla Mortellaro, CNM AROM, cl AF, vertex well applied.  A / P: Induction of labor due to gestational hypertension,  progressing well on pitocin BP labile, no worsening, will consider Epidural for BP reduction benefit.  Fetal Wellbeing:  Category I Pain Control:  Labor support without medications  Anticipated MOD:  NSVD  Rakwon Letourneau 12/26/2011, 12:12 PM

## 2011-12-26 NOTE — Anesthesia Procedure Notes (Signed)
Epidural Patient location during procedure: OB Start time: 12/26/2011 3:40 PM End time: 12/26/2011 3:45 PM Reason for block: procedure for pain  Staffing Anesthesiologist: Sandrea Hughs Performed by: anesthesiologist   Preanesthetic Checklist Completed: patient identified, site marked, surgical consent, pre-op evaluation, timeout performed, IV checked, risks and benefits discussed and monitors and equipment checked  Epidural Patient position: sitting Prep: site prepped and draped and DuraPrep Patient monitoring: continuous pulse ox and blood pressure Approach: midline Injection technique: LOR air  Needle:  Needle type: Tuohy  Needle gauge: 17 G Needle length: 9 cm Needle insertion depth: 5 cm cm Catheter type: closed end flexible Catheter size: 19 Gauge Catheter at skin depth: 10 cm Test dose: negative and Other  Assessment Sensory level: T10 Events: blood not aspirated, injection not painful, no injection resistance, negative IV test and no paresthesia

## 2011-12-26 NOTE — Anesthesia Preprocedure Evaluation (Signed)
Anesthesia Evaluation  Patient identified by MRN, date of birth, ID band Patient awake    Reviewed: Allergy & Precautions, H&P , NPO status , Patient's Chart, lab work & pertinent test results  Airway Mallampati: I TM Distance: >3 FB Neck ROM: full    Dental No notable dental hx.    Pulmonary neg pulmonary ROS,    Pulmonary exam normal       Cardiovascular hypertension,     Neuro/Psych PSYCHIATRIC DISORDERS Depression negative neurological ROS     GI/Hepatic negative GI ROS, Neg liver ROS,   Endo/Other  negative endocrine ROS  Renal/GU negative Renal ROS  negative genitourinary   Musculoskeletal negative musculoskeletal ROS (+)   Abdominal Normal abdominal exam  (+)   Peds negative pediatric ROS (+)  Hematology negative hematology ROS (+)   Anesthesia Other Findings   Reproductive/Obstetrics (+) Pregnancy                           Anesthesia Physical Anesthesia Plan  ASA: II  Anesthesia Plan: Epidural   Post-op Pain Management:    Induction:   Airway Management Planned:   Additional Equipment:   Intra-op Plan:   Post-operative Plan:   Informed Consent: I have reviewed the patients History and Physical, chart, labs and discussed the procedure including the risks, benefits and alternatives for the proposed anesthesia with the patient or authorized representative who has indicated his/her understanding and acceptance.     Plan Discussed with:   Anesthesia Plan Comments:         Anesthesia Quick Evaluation

## 2011-12-26 NOTE — Progress Notes (Signed)
S: Epidural requested an hour ago and placed, now comfortable with ctx. Denies PEC s/s. O: Filed Vitals:   12/26/11 1608 12/26/11 1613 12/26/11 1618 12/26/11 1620  BP: 118/73 119/73 133/82 122/76  Pulse: 88 88 88 85  Temp:      TempSrc:      Resp: 18 18 18 18   Height:      Weight:      SpO2: 98% 98%       FHT:  FHR: 135 bpm, variability: moderate,  accelerations:  Present,  decelerations:  Absent UC:  Irregular 1.5 - 3.5, dystotic pattern  SVE:   Dilation: 4 Effacement (%): 90 Station: 0 Exam by:: J.Thornton, RN IUPC placed with ease   A / P: Induction of labor due to gestational hypertension,  IUPC placed, Pitocin titrate to MVU >200 BP improved with epidural, will hold labetalol Plts stable on repeat for epidural (132) Fetal Wellbeing:  Category I Pain Control:  Epidural effective  Anticipated MOD:  NSVD  Alyia Lacerte 12/26/2011, 4:34 PM

## 2011-12-26 NOTE — Progress Notes (Signed)
Called dr Arby Barrette informed of cbc results, ok to pull out epidural cath

## 2011-12-26 NOTE — H&P (Signed)
OB ADMISSION/ HISTORY & PHYSICAL:  Admission Date: 12/26/2011  7:19 AM  Admit Diagnosis: Gestational hypertension for induction of labor at term   Alison [name] is a 31 y.o. female presenting for IOL, GHTN w/o s/s PEC, nl PEC labs 3/13 (ALT/AST 24/17; plts 172, uric acid 4.0, TUP 89).  Prenatal History: G1P0   EDC : Not found.  Prenatal care at [facility] since [redacted] weeks gestation  Prenatal course complicated by gestational hypertension with onset at 33 wks, managed with increased rest and serial PEC labs. Antenatal testing nl, BPP 8/8 and RNST on 3/26, EFW 8-0, nl AFI, placenta posterior, grade III.   Prenatal Labs: ABO, Rh:   O pos Antibody:  neg Rubella:   immune RPR:   NR HBsAg:   NR HIV:   neg GBS:   neg 1 hr Glucola : wnl   Medical / Surgical History :  Past medical history:  Past Medical History  Diagnosis Date  . DEPRESSION 01/31/2010  . ALLERGIC RHINITIS 01/31/2010  . RENAL CALCULUS, HX OF 01/31/2010  . Gestational hypertension 12/26/2011  . Normal labor and delivery (IOL - Pitocin) 12/26/2011     Past surgical history:  Past Surgical History  Procedure Date  . No past surgeries      Family History:  Family History  Problem Relation Age of Onset  . Arthritis Mother   . Arthritis Father   . Arthritis Maternal Grandmother     Rheumatoid Arthritis  . Diabetes Other   . Hypertension Other     grandparent  . Stroke Other     grandparent     Social History:  reports that she has never smoked. She does not have any smokeless tobacco history on file. She reports that she does not drink alcohol or use illicit drugs.   Allergies: Review of patient's allergies indicates no known allergies.    Current Medications at time of admission:  Prescriptions prior to admission  Medication Sig Dispense Refill  . budesonide (RHINOCORT AQUA) 32 MCG/ACT nasal spray Place 2 sprays into the nose daily as needed. For allergies      . OMEGA 3 1000 MG CAPS  Take by mouth daily.        . Prenatal Vit-Fe Fumarate-FA (PRENATAL MULTIVITAMIN) TABS Take 1 tablet by mouth daily.      . Psyllium-Calcium (METAMUCIL PLUS CALCIUM) CAPS Take 2 capsules by mouth every morning.          Review of Systems: Denies HA, N/V, epigastric pain. Prodromal contractions for past 3 days. Labor herbal prep for past 4 weeks - evening primrose oil, Blue Cohosh, and red raspberry leaf tea.    Physical Exam:   Filed Vitals:   12/26/11 0828 12/26/11 0831 12/26/11 0841 12/26/11 0851  BP: 141/104 145/104 148/106 147/99  Pulse: 88 87 98 91  Temp:      TempSrc:      Resp: 18      Lab Results  Component Value Date   WBC 10.6* 12/26/2011   HGB 12.8 12/26/2011   HCT 36.9 12/26/2011   MCV 91.3 12/26/2011   PLT 133* 12/26/2011   Lab Results  Component Value Date   ALT 25 12/26/2011   AST 31 12/26/2011   ALKPHOS 172* 12/26/2011   BILITOT 0.2* 12/26/2011   Uric acid pending.  General: AAO x 3, NAD, anxious Heart: RRR Lungs: CTAB Abdomen: soft, NT, S=D, EFW 8-0 Extremities: trace edema LE's, DTR's +2, no clonus Genitalia /  VE: 3 / 80 / -1 vertex, memb intact  FHR: 135, moderate variability, no decel's + accel's 15x15 TOCO: irregular mild ctx     Assessment: 1. G1P0 at 40 wks 4 d for IOL with favorable cervix 2. Gestational Hypertension, no evidence PEC 3. GBS negative    Plan:  1. Admit to BS 2. Pitocin induction, AROM with advanced dilation 3. Started Labetalol 100 mg PO TID  Plan discussed with Dr. Juliene Pina - agrees   [facility] 12/26/2011, 8:58 AM  Pt d/w CNM, reviewed, agree with note and plan. G.HTN, no PEC, watch BP, Labetalol PO now, IV if not improved. --V.Jamile Rekowski< MD

## 2011-12-26 NOTE — Progress Notes (Signed)
Alison [name] is a 31 y.o. G1P0 at Unknown by ultrasound admitted for induction of labor due to Hypertension.  Subjective: Some anxiety, denies PEC s/s. Discussed options for Pitocin augmentation vs AROM at this time. Agrees to Pitocin with later possibility of AROM if no stable progress.   Objective: BP 145/101  Pulse 88  Temp(Src) 98 F (36.7 C) (Oral)  Resp 18  Ht 5\' 2"  (1.575 m)  Wt 81.647 kg (180 lb)  BMI 32.92 kg/m2  LMP 12/22/2010       FHT:  FHR: 130 bpm, variability: moderate,  accelerations:  Present,  decelerations:  Absent UC:   irregular, every 1-4 minutes SVE:   Dilation: 3 Effacement (%): 80 Station: -1  Labs: Lab Results  Component Value Date   WBC 10.6* 12/26/2011   HGB 12.8 12/26/2011   HCT 36.9 12/26/2011   MCV 91.3 12/26/2011   PLT 133* 12/26/2011    Assessment / Plan: Induction of labor due to gestational hypertension,  progressing well on pitocin  Labor: Started on Pitocin Preeclampsia:  no signs or symptoms of toxicity and labs stable, mild thrombocytopenia Fetal Wellbeing:  Category I Pain Control:  Analgesia / anesthesia PRN I/D:  GBS neg Anticipated MOD:  NSVD  Alison [name] 12/26/2011, 9:15 AM

## 2011-12-27 ENCOUNTER — Encounter (HOSPITAL_COMMUNITY): Payer: Self-pay

## 2011-12-27 LAB — COMPREHENSIVE METABOLIC PANEL
ALT: 24 U/L (ref 0–35)
AST: 38 U/L — ABNORMAL HIGH (ref 0–37)
Albumin: 2.3 g/dL — ABNORMAL LOW (ref 3.5–5.2)
Alkaline Phosphatase: 131 U/L — ABNORMAL HIGH (ref 39–117)
BUN: 11 mg/dL (ref 6–23)
CO2: 24 mEq/L (ref 19–32)
Calcium: 8.9 mg/dL (ref 8.4–10.5)
Chloride: 103 mEq/L (ref 96–112)
Creatinine, Ser: 0.92 mg/dL (ref 0.50–1.10)
GFR calc Af Amer: >90 mL/min (ref >90)
GFR calc non Af Amer: 82 mL/min — ABNORMAL LOW (ref >90)
Glucose, Bld: 101 mg/dL — ABNORMAL HIGH (ref 70–99)
Potassium: 3.7 mEq/L (ref 3.5–5.1)
Sodium: 135 mEq/L (ref 135–145)
Total Bilirubin: 0.3 mg/dL (ref 0.3–1.2)
Total Protein: 5 g/dL — ABNORMAL LOW (ref 6.0–8.3)

## 2011-12-27 LAB — CBC
HCT: 32.5 % — ABNORMAL LOW (ref 36.0–46.0)
Hemoglobin: 11.2 g/dL — ABNORMAL LOW (ref 12.0–15.0)
MCH: 31.5 pg (ref 26.0–34.0)
MCHC: 34.5 g/dL (ref 30.0–36.0)
MCV: 91.3 fL (ref 78.0–100.0)
Platelets: 134 10*3/uL — ABNORMAL LOW (ref 150–400)
RBC: 3.56 MIL/uL — ABNORMAL LOW (ref 3.87–5.11)
RDW: 13.5 % (ref 11.5–15.5)
WBC: 18.6 10*3/uL — ABNORMAL HIGH (ref 4.0–10.5)

## 2011-12-27 NOTE — Anesthesia Postprocedure Evaluation (Signed)
Anesthesia Post Note  Patient: Alison [name]  Procedure(s) Performed: * No procedures listed *  Anesthesia type: Epidural  Patient location: Mother/Baby  Post pain: Pain level controlled  Post assessment: Post-op Vital signs reviewed  Last Vitals:  Filed Vitals:   12/27/11 0630  BP: 121/83  Pulse: 85  Temp: 36.9 C  Resp: 18    Post vital signs: Reviewed  Level of consciousness: awake  Complications: No apparent anesthesia complications 

## 2011-12-27 NOTE — Progress Notes (Signed)
SW referral for h/o depression.  Pt declined to speak with social worker after she stated the depression is no longer an issue.  She stated she did take medications previously for anxiety.  Informed unit RN.  SW will also inform weekday SW for further follow up if needed. Louie Boston, LCSW

## 2011-12-27 NOTE — Progress Notes (Signed)
PPD 1 SVD  S:  Reports feeling well, denies PEC s/s, tired.             Tolerating po/ No nausea or vomiting             Bleeding is light             Pain controlled with Motrin.             Up ad lib / ambulatory / voiding well.  Newborn  Information for the patient's newborn:  Haruye, Lainez [[id number]]  female  breast feeding  / Circumcision planned   O:  A & O x 3 NAD             VS:  Filed Vitals:   12/26/11 2345 12/27/11 0315 12/27/11 0608 12/27/11 0630  BP: 134/79 113/72 126/85 121/83  Pulse: 102 88 82 85  Temp: 98.7 F (37.1 C) 98.5 F (36.9 C) 98.4 F (36.9 C) 98.5 F (36.9 C)  TempSrc: Oral Oral Oral Oral  Resp: 18 18 18 18   Height:      Weight:      SpO2:        LABS:  Basename 12/27/11 0550 12/26/11 2135  WBC 18.6* 16.2*  HGB 11.2* 11.8*  HCT 32.5* 33.8*  PLT 134* 128*   Lab Results  Component Value Date   ALT 24 12/27/2011   AST 38* 12/27/2011   ALKPHOS 131* 12/27/2011   BILITOT 0.3 12/27/2011     Lungs: Clear and unlabored  Heart: regular rate and rhythm / no mumurs  Abdomen: soft, non-tender, non-distended              Fundus: firm, non-tender, U-2  Perineum: repair intact, minimal edema  Lochia: scant  Extremities: trace edema, no calf pain or tenderness, neg Homans    A/P: PPD # 1 31 y.o., G1P1   Principal Problem:  *Gestational hypertension  BP's normalizing, PEC labs slight increase in AST (38), platelets improved this am  No evidence of PEC Active Problems: PP care - s/p NVB water birth 3/30     Doing well - stable status  Routine post partum orders  Anticipate discharge home in AM.   Debria Broecker, CNM, MSN 12/27/2011, 9:19 AM

## 2011-12-27 NOTE — Anesthesia Postprocedure Evaluation (Signed)
Anesthesia Post Note  Patient: Alison [name]  Procedure(s) Performed: * No procedures listed *  Anesthesia type: Epidural  Patient location: Mother/Baby  Post pain: Pain level controlled  Post assessment: Post-op Vital signs reviewed  Last Vitals:  Filed Vitals:   12/27/11 0630  BP: 121/83  Pulse: 85  Temp: 36.9 C  Resp: 18    Post vital signs: Reviewed  Level of consciousness: awake  Complications: No apparent anesthesia complications

## 2011-12-27 NOTE — Addendum Note (Signed)
Addendum  created 12/27/11 0914 by Jhonnie Garner, CRNA   Modules edited:Charges VN, Notes Section

## 2011-12-28 ENCOUNTER — Encounter (HOSPITAL_COMMUNITY): Payer: Self-pay

## 2011-12-28 LAB — COMPREHENSIVE METABOLIC PANEL
ALT: 35 U/L (ref 0–35)
AST: 49 U/L — ABNORMAL HIGH (ref 0–37)
Albumin: 2.3 g/dL — ABNORMAL LOW (ref 3.5–5.2)
Alkaline Phosphatase: 121 U/L — ABNORMAL HIGH (ref 39–117)
BUN: 12 mg/dL (ref 6–23)
CO2: 26 mEq/L (ref 19–32)
Calcium: 8.6 mg/dL (ref 8.4–10.5)
Chloride: 106 mEq/L (ref 96–112)
Creatinine, Ser: 0.87 mg/dL (ref 0.50–1.10)
GFR calc Af Amer: >90 mL/min (ref >90)
GFR calc non Af Amer: 88 mL/min — ABNORMAL LOW (ref >90)
Glucose, Bld: 82 mg/dL (ref 70–99)
Potassium: 3.9 mEq/L (ref 3.5–5.1)
Sodium: 138 mEq/L (ref 135–145)
Total Bilirubin: 0.1 mg/dL — ABNORMAL LOW (ref 0.3–1.2)
Total Protein: 5.2 g/dL — ABNORMAL LOW (ref 6.0–8.3)

## 2011-12-28 LAB — CBC
HCT: 31 % — ABNORMAL LOW (ref 36.0–46.0)
Hemoglobin: 10.7 g/dL — ABNORMAL LOW (ref 12.0–15.0)
MCH: 31.8 pg (ref 26.0–34.0)
MCHC: 34.5 g/dL (ref 30.0–36.0)
MCV: 92 fL (ref 78.0–100.0)
Platelets: 136 10*3/uL — ABNORMAL LOW (ref 150–400)
RBC: 3.37 MIL/uL — ABNORMAL LOW (ref 3.87–5.11)
RDW: 13.6 % (ref 11.5–15.5)
WBC: 16 10*3/uL — ABNORMAL HIGH (ref 4.0–10.5)

## 2011-12-28 MED ORDER — LABETALOL HCL 100 MG PO TABS: 100.0000 mg | ORAL_TABLET | Freq: Three times a day (TID) | ORAL | Status: DC

## 2011-12-28 MED ORDER — LABETALOL HCL 100 MG PO TABS
100.0000 mg | ORAL_TABLET | Freq: Three times a day (TID) | ORAL | Status: DC
  Administered 2011-12-28: 100 mg via ORAL
  Filled 2011-12-28: qty 1

## 2011-12-28 MED ORDER — IBUPROFEN 600 MG PO TABS: 600.0000 mg | ORAL_TABLET | Freq: Four times a day (QID) | ORAL | Status: AC

## 2011-12-28 MED ORDER — BENZOCAINE-MENTHOL 20-0.5 % EX AERO
INHALATION_SPRAY | CUTANEOUS | Status: AC
  Filled 2011-12-28: qty 56

## 2011-12-28 NOTE — Progress Notes (Signed)
PPD 1 SVD  S:  Reports feeling well, denies PEC s/s.             Tolerating po/ No nausea or vomiting             Bleeding is light             Pain controlled with Motrin.             Up ad lib / ambulatory / voiding well and frequently large amounts.  Newborn  Information for the patient's newborn:  Erma, Raiche [[id number]]  female   breast feeding  / Circumcision in progress   O:  A & O x 3 NAD             VS:  Filed Vitals:   12/27/11 2108 12/28/11 0521 12/28/11 0629 12/28/11 0630  BP: 138/86 133/86 161/98 158/93  Pulse: 102 92  81  Temp: 98.2 F (36.8 C) 98.6 F (37 C)    TempSrc: Oral Oral    Resp: 20 20    Height:      Weight:      SpO2:        LABS:   Basename 12/28/11 0700 12/27/11 0550  WBC 16.0* 18.6*  HGB 10.7* 11.2*  HCT 31.0* 32.5*  PLT 136* 134*   Lab Results  Component Value Date   ALT 35 12/28/2011   AST 49* 12/28/2011   ALKPHOS 121* 12/28/2011   BILITOT 0.1* 12/28/2011      Abdomen: soft, non-tender, non-distended              Fundus: firm, non-tender, U-2  Perineum: repair intact, minimal edema  Lochia: scant  Extremities: trace edema, no calf pain or tenderness, neg Homans    A/P: PPD # 1 31 y.o., G1P1   Principal Problem:  *Gestational hypertension  BP's trending up, started Labetalol 100 mg PO TID  No evidence of PEC, labs rpt today - stable  Plan BP check in office in 4 days Active Problems: PP care - s/p NVB water birth 3/30     Doing well - stable status  Routine post partum orders  DC home with WOB instruction and PEC precautions   Charlee Squibb, CNM, MSN 12/28/2011, 8:12 AM

## 2011-12-28 NOTE — Discharge Summary (Signed)
Obstetric Discharge Summary Reason for Admission: induction of labor and gestational hypertension Prenatal Procedures: NST, Preeclampsia and ultrasound Intrapartum Procedures: spontaneous vaginal delivery Postpartum Procedures: none Complications-Operative and Postpartum: 2nd degree perineal laceration and bilateral labial splits repaired Hemoglobin  Date Value Range Status  12/28/2011 10.7* 12.0-15.0 (g/dL) Final     HCT  Date Value Range Status  12/28/2011 31.0* 36.0-46.0 (%) Final   Lab Results  Component Value Date   ALT 35 12/28/2011   AST 49* 12/28/2011   ALKPHOS 121* 12/28/2011   BILITOT 0.1* 12/28/2011    Physical Exam:  General: alert, cooperative and no distress Lochia: appropriate Uterine Fundus: firm Incision: healing well DVT Evaluation: Negative Homan's sign. No significant calf/ankle edema.  Discharge Diagnoses: Term Pregnancy-delivered and postpartum hypertension without evidence of preeclampsia  Discharge Information: Date: 12/28/2011 Activity: pelvic rest Diet: routine Medications:   Alison, [name]  Home Medication Instructions [id number]   Printed on:12/30/11 1117  Medication Information                    OMEGA 3 1000 MG CAPS Take by mouth daily.             Prenatal Vit-Fe Fumarate-FA (PRENATAL MULTIVITAMIN) TABS Take 1 tablet by mouth daily.           budesonide (RHINOCORT AQUA) 32 MCG/ACT nasal spray Place 2 sprays into the nose daily as needed. For allergies           Psyllium-Calcium (METAMUCIL PLUS CALCIUM) CAPS Take 2 capsules by mouth every morning.           ibuprofen (ADVIL,MOTRIN) 600 MG tablet Take 1 tablet (600 mg total) by mouth every 6 (six) hours.           labetalol (NORMODYNE) 100 MG tablet Take 1 tablet (100 mg total) by mouth 3 (three) times daily.           Condition: stable Instructions: refer to practice specific booklet Discharge to: home Follow-up Information    Follow up with Marlinda Mike, CNM. Schedule an  appointment as soon as possible for a visit in 4 days. (BP check)    Contact information:   [address] [place] [address] [phone]          Newborn Data: Live born female "Marlene Bast" Birth Weight: 7 lb 15.7 oz (3620 g) APGAR: 6, 9  Home with mother.  Alison [name] 12/28/2011, 8:17 AM

## 2011-12-28 NOTE — Discharge Instructions (Signed)
Postpartum Depression and Baby Blues The postpartum period begins right after the birth of a baby. During this time, there is often a great amount of joy and excitement. It is also a time of considerable changes in the life of the parent(s). Regardless of how many times a mother gives birth, each child brings new challenges and dynamics to the family. It is not unusual to have feelings of excitement accompanied by confusing shifts in moods, emotions, and thoughts. All mothers are at risk of developing postpartum depression or the "baby blues." These mood changes can occur right after giving birth, or they may occur many months after giving birth. The baby blues or postpartum depression can be mild or severe. Additionally, postpartum depression can resolve rather quickly, or it can be a long-term condition. CAUSES Elevated hormones and their rapid decline are thought to be a main cause of postpartum depression and the baby blues. There are a number of hormones that radically change during and after pregnancy. Estrogen and progesterone usually decrease immediately after delivering your baby. The level of thyroid hormone and various cortisol steroids also rapidly drop. Other factors that play a major role in these changes include major life events and genetics.  RISK FACTORS If you have any of the following risks for the baby blues or postpartum depression, know what symptoms to watch out for during the postpartum period. Risk factors that may increase the likelihood of getting the baby blues or postpartum depression include:  Havinga personal or family history of depression.   Having depression while being pregnant.   Having premenstrual or oral contraceptive-associated mood issues.   Having exceptional life stress.   Having marital conflict.   Lacking a social support network.   Having a baby with special needs.   Having health problems such as diabetes.  SYMPTOMS Baby blues symptoms  include:  Brief fluctuations in mood, such as going from extreme happiness to sadness.   Decreased concentration.   Difficulty sleeping.   Crying spells, tearfulness.   Irritability.   Anxiety.  Postpartum depression symptoms typically begin within the first month after giving birth. These symptoms include:  Difficulty sleeping or excessive sleepiness.   Marked weight loss.   Agitation.   Feelings of worthlessness.   Lack of interest in activity or food.  Postpartum psychosis is a very concerning condition and can be dangerous. Fortunately, it is rare. Displaying any of the following symptoms is cause for immediate medical attention. Postpartum psychosis symptoms include:  Hallucinations and delusions.   Bizarre or disorganized behavior.   Confusion or disorientation.  DIAGNOSIS  A diagnosis is made by an evaluation of your symptoms. There are no medical or lab tests that lead to a diagnosis, but there are various questionnaires that a caregiver may use to identify those with the baby blues, postpartum depression, or psychosis. Often times, a screening tool called the Edinburgh Postnatal Depression Scale is used to diagnose depression in the postpartum period.  TREATMENT The baby blues usually goes away on its own in 1 to 2 weeks. Social support is often all that is needed. You should be encouraged to get adequate sleep and rest. Occasionally, you may be given medicines to help you sleep.  Postpartum depression requires treatment as it can last several months or longer if it is not treated. Treatment may include individual or group therapy, medicine, or both to address any social, physiological, and psychological factors that may play a role in the depression. Regular exercise, a healthy diet,   rest, and social support may also be strongly recommended.  Postpartum psychosis is more serious and needs treatment right away. Hospitalization is often needed. HOME CARE  INSTRUCTIONS  Get as much rest as you can. Nap when the baby sleeps.   Exercise regularly. Some women find yoga and walking to be beneficial.   Eat a balanced and nourishing diet.   Do little things that you enjoy. Have a cup of tea, take a bubble bath, read your favorite magazine, or listen to your favorite music.   Avoid alcohol.   Ask for help with household chores, cooking, grocery shopping, or running errands as needed. Do not try to do everything.   Talk to people close to you about how you are feeling. Get support from your partner, family members, friends, or other new moms.   Try to stay positive in how you think. Think about the things you are grateful for.   Do not spend a lot of time alone.   Only take medicine as directed by your caregiver.   Keep all your postpartum appointments.   Let your caregiver know if you have any concerns.  SEEK MEDICAL CARE IF: You are having a reaction or problems with your medicine. SEEK IMMEDIATE MEDICAL CARE IF:  You have suicidal feelings.   You feel you may harm the baby or someone else.  Document Released: 06/18/2004 Document Revised: 09/03/2011 Document Reviewed: 07/21/2011 [facility] Patient Information 2012 [place], [place].Hypertension During Pregnancy Hypertension is also called high blood pressure. It can occur at any time in life and during pregnancy. When you have hypertension, there is extra pressure inside your blood vessels that carry blood from the heart to the rest of your body (arteries). Hypertension during pregnancy can cause problems for you and your baby. Your baby might not weigh as much as it should at birth or might be born early (premature). Very bad cases of hypertension during pregnancy can be life-threatening.  There are different types of hypertension during pregnancy.   Chronic hypertension. This happens when a woman has hypertension before pregnancy and it continues during pregnancy.   Gestational  hypertension. This is when hypertension develops during pregnancy.   Preeclampsia or toxemia of pregnancy. This is a very serious type of hypertension that develops only during pregnancy. It is a disease that affects the whole body (systemic) and can be very dangerous for both mother and baby.   Gestational hypertension and preeclampsia usually go away after your baby is born. Blood pressure generally stabilizes within 6 weeks. Women who have hypertension during pregnancy have a greater chance of developing hypertension later in life or with future pregnancies. UNDERSTANDING BLOOD PRESSURE Blood pressure moves blood in your body. Sometimes, the force that moves the blood becomes too strong.  A blood pressure reading is given in 2 numbers and looks like a fraction.   The top number is called the systolic pressure. When your heart beats, it forces more blood to flow through the arteries. Pressure inside the arteries goes up.   The bottom number is the diastolic pressure. Pressure goes down between beats. That is when the heart is resting.   You may have hypertension if:   Your systolic blood pressure is above 140.   Your diastolic pressure is above 90.  RISK FACTORS Some factors make you more likely to develop hypertension during pregnancy. Risk factors include:  Having hypertension before pregnancy.   Having hypertension during a previous pregnancy.   Being overweight.   Being older  than 40.   Being pregnant with more than 1 baby (multiples).   Having diabetes or kidney problems.  SYMPTOMS Chronic and gestational hypertension may not cause symptoms. Preeclampsia has symptoms, which may include:  Increased protein in your urine. Your caregiver will check for this at every prenatal visit.   Swelling of your hands and face.   Rapid weight gain.   Headaches.   Visual changes.   Being bothered by light.   Abdominal pain, especially in the right upper area.   Chest pain.    Shortness of breath.   Increased reflexes.   Seizures. Seizures occur with a more severe form of preeclampsia, called eclampsia.  DIAGNOSIS   You may be diagnosed with hypertension during pregnancy during a regular prenatal exam. At each visit, tests may include:   Blood pressure checks.   A urine test to check for protein in your urine.   The type of hypertension you are diagnosed with depends on when you developed it. It also depends on your specific blood pressure reading.   Developing hypertension before 20 weeks of pregnancy is consistent with chronic hypertension.   Developing hypertension after 20 weeks of pregnancy is consistent with gestational hypertension.   Hypertension with increased urinary protein is diagnosed as preeclampsia.   Blood pressure measurements that stay above 160 systolic or 110 diastolic are a sign of severe preeclampsia.  TREATMENT Treatment for hypertension during pregnancy varies. Treatment depends on the type of hypertension and how serious it is.  If you take medicine for chronic hypertension, you may need to switch medicines.   Drugs called ACE inhibitors should not be taken during pregnancy.   Low-dose aspirin may be suggested for women who have risk factors for preeclampsia.   If you have gestational hypertension, you may need to take a blood pressure medicine that is safe during pregnancy. Your caregiver will recommend the appropriate medicine.   If you have severe preeclampsia, you may need to be in the hospital. Caregivers will watch you and the baby very closely. You also may need to take medicine (magnesium sulfate) to prevent seizures and lower blood pressure.   Sometimes an early delivery is needed. This may be the case if the condition worsens. It would be done to protect you and the baby. The only cure for preeclampsia is delivery.  HOME CARE INSTRUCTIONS  Schedule and keep all of your regular prenatal care.   Follow your  caregiver's instructions for taking medicines. Tell your caregiver about all medicines you take. This includes over-the-counter medicines.   Eat as little salt as possible.   Get regular exercise.   Do not drink alcohol.   Do not use tobacco products.   Do not drink products with caffeine.   Lie on your left side when resting.   Tell your doctor if you have any preeclampsia symptoms.  SEEK IMMEDIATE MEDICAL CARE IF:  You have severe abdominal pain.   You have sudden swelling in the hands, ankles, or face.   You gain 4 pounds (1.8 kg) or more in 1 week.   You vomit repeatedly.   You have vaginal bleeding.   You do not feel the baby moving as much.   You have a headache.   You have blurred or double vision.   You have muscle twitching or spasms.   You have shortness of breath.   You have blue fingernails and lips.   You have blood in your urine.  MAKE SURE YOU:  Understand these instructions.   Will watch your condition.   Will get help right away if you are not doing well.  Document Released: 06/02/2011 Document Revised: 09/03/2011 Document Reviewed: 06/02/2011 [facility] Patient Information 2012 [place], [place].Breast Pumping Tips Pumping your breast milk is a good way to stimulate milk production and have a steady supply of breast milk for your infant. Pumping is most helpful during your infant's growth spurts, when involving dad or a family member, or when you are away. There are several types of pumps available. They can be purchased at a baby or maternity store. You can begin pumping soon after delivery, but some experts believe that you should wait about four weeks to give your infant a bottle. In general, the more you breastfeed or pump, the more milk you will have for your infant. It is also important to take good care of yourself. This will reduce stress and help your body to create a healthy supply of milk. Your caregiver or lactation consultant can give you  the information and support you need in your efforts to breastfeed your infant. PUMPING BREAST MILK  Follow the tips below for successful breast pumping. Take care of yourself.  Drink enough water or fluids to keep urine clear or pale yellow. You may notice a thirsty feeling while breastfeeding. This is because your body needs more water to make breast milk. Keep a large water bottle handy. Make healthy drink choices such as unsweetened fruit juice, milk and water. Limit soda, coffee, and alcohol (wait 2 hours to feed or pump if you have an alcoholic drink.)   Eat a healthy, well-balanced diet rich in fruits, vegetables, and whole grains.   Exercise as recommended by your caregiver.   Get plenty of sleep. Sleep when your infant sleeps. Ask friends and family for help if you need time to nap or rest.   Do not smoke. Smoking can lower your milk supply and harm your infant. If you need help quitting, ask your caregiver for a program recommendation.   Ask your caregiver about birth control options. Birth control pills may lower your milk supply. You may be advised to use condoms or other forms of birth control.  Relax and pump Stimulating your let-down reflex is the key to successful and effective pumping. This makes the milk in all parts of the breast flow more freely.   It is easier to pump breast milk (and breastfeed) while you are relaxed. Find techniques that work for you. Quiet private spaces, breast massage, soothing heat placed on the breast, music, and pictures or a tape recording of your infant may help you to relax and "let down" your milk. If you have difficulty with your let down, try smelling one of your infant's blankets or an item of clothing he or she has worn while you are pumping.   When pumping, place the special suction cup (flange) directly over the nipple. It may be uncomfortable and cause nipple damage if it is not placed properly or is the wrong size. Applying a small amount  of purified or modified lanolin to your nipple and the areola may help increase your comfort level. Also, you can change the speed and suction of many electric pumps to your comfort level. Your caregiver or lactation consultant can help you with this.   If pumping continues to be painful, or you feel you are not getting very much milk when you pump, you may need a different type of pump. A lactation  consultant can help you determine if this is the case.   If you are with your infant, feed him or her on demand and try pumping after each feeding. This will boost your production, even if milk does not come out. You may not be able to pump much milk at first, but keep up the routine, and this will change.   If you are working or away from your infant for several hours, try pumping for about 15 minutes every 2 to 3 hours. Pump both breasts at the same time if you can.   If your infant has a formula feeding, make sure you pump your milk around the same time to maintain your supply.   Begin pumping breast milk a few weeks before you return to work. This will help you develop techniques that work for you and will be able to store extra milk.   Find a source of breastfeeding information that works well for you.  TIPS FOR STORING BREAST MILK  Store breast milk in a sealable sterile bag, jar, or container provided with your pumping supplies.   Store milk in small amounts close to what your infant is drinking at each feeding.   Cool pumped milk in a refrigerator or cooler. Pumped milk can last at the back of the refrigerator for 3 to 8 days.   Place cooled milk at the back of the freezer for up to 3 months.   Thaw the milk in its container or bag in warm water up to 24 hours in advance. Do not use a microwave to thaw or heat milk. Do not refreeze the milk after it has been thawed.   Breast milk is safe to drink when left at room temperature (mid 70s or colder) for 4 to 8 hours. After that, throw it  away.   Milk fat can separate and look funny. The color can vary slightly from day to day. This is normal. Always shake the milk before using it to mix the fat with the more watery portion.  SEEK MEDICAL CARE IF:   You are having trouble pumping or feeding your infant.   You are concerned that you are not making enough milk.   You have nipple pain, soreness, or redness.   You have other questions or concerns related to you or your infant.  Document Released: 03/04/2010 Document Revised: 09/03/2011 Document Reviewed: 03/04/2010 [facility] Patient Information 2012 [place], [place].

## 2012-01-01 NOTE — Discharge Summary (Signed)
Reviewed, agree w note and plan. --V.Shauntea Lok, MD

## 2012-04-28 LAB — HM PAP SMEAR

## 2012-07-12 ENCOUNTER — Other Ambulatory Visit: Payer: Federal, State, Local not specified - PPO

## 2012-07-12 ENCOUNTER — Ambulatory Visit
Admission: RE | Admit: 2012-07-12 | Discharge: 2012-07-12 | Disposition: A | Discharge status: Home / Self Care | Payer: Federal, State, Local not specified - PPO | Source: Ambulatory Visit | Attending: Certified Nurse Midwife | Admitting: Certified Nurse Midwife

## 2012-07-12 ENCOUNTER — Other Ambulatory Visit: Payer: Self-pay | Admitting: Certified Nurse Midwife

## 2012-07-12 DIAGNOSIS — N63 Unspecified lump in unspecified breast: Secondary | ICD-10-CM

## 2013-03-10 ENCOUNTER — Other Ambulatory Visit: Payer: Self-pay

## 2013-08-23 ENCOUNTER — Encounter: Payer: Self-pay | Admitting: Internal Medicine

## 2013-08-23 ENCOUNTER — Other Ambulatory Visit (INDEPENDENT_AMBULATORY_CARE_PROVIDER_SITE_OTHER): Payer: 59

## 2013-08-23 ENCOUNTER — Ambulatory Visit (INDEPENDENT_AMBULATORY_CARE_PROVIDER_SITE_OTHER): Payer: 59 | Admitting: Internal Medicine

## 2013-08-23 VITALS — BP 132/98 | HR 110 | Temp 98.7°F | Ht 62.0 in | Wt 136.0 lb

## 2013-08-23 DIAGNOSIS — R05 Cough: Secondary | ICD-10-CM

## 2013-08-23 DIAGNOSIS — R059 Cough, unspecified: Secondary | ICD-10-CM

## 2013-08-23 DIAGNOSIS — Z0000 Encounter for general adult medical examination without abnormal findings: Secondary | ICD-10-CM

## 2013-08-23 DIAGNOSIS — I10 Essential (primary) hypertension: Secondary | ICD-10-CM

## 2013-08-23 LAB — BASIC METABOLIC PANEL
BUN: 18 mg/dL (ref 6–23)
CO2: 29 mEq/L (ref 19–32)
Calcium: 9.2 mg/dL (ref 8.4–10.5)
Chloride: 102 mEq/L (ref 96–112)
Creatinine, Ser: 0.8 mg/dL (ref 0.4–1.2)
GFR: 85.26 mL/min (ref >60.00)
Glucose, Bld: 90 mg/dL (ref 70–99)
Potassium: 3.7 mEq/L (ref 3.5–5.1)
Sodium: 137 mEq/L (ref 135–145)

## 2013-08-23 LAB — HEPATIC FUNCTION PANEL
ALT: 17 U/L (ref 0–35)
AST: 21 U/L (ref 0–37)
Albumin: 3.7 g/dL (ref 3.5–5.2)
Alkaline Phosphatase: 72 U/L (ref 39–117)
Bilirubin, Direct: 0 mg/dL (ref 0.0–0.3)
Total Bilirubin: 0.5 mg/dL (ref 0.3–1.2)
Total Protein: 7.7 g/dL (ref 6.0–8.3)

## 2013-08-23 LAB — LIPID PANEL
Cholesterol: 205 mg/dL — ABNORMAL HIGH (ref 0–200)
HDL: 59.5 mg/dL (ref >39.00)
Total CHOL/HDL Ratio: 3
Triglycerides: 97 mg/dL (ref 0.0–149.0)
VLDL: 19.4 mg/dL (ref 0.0–40.0)

## 2013-08-23 LAB — CBC WITH DIFFERENTIAL/PLATELET
Basophils Absolute: 0 10*3/uL (ref 0.0–0.1)
Basophils Relative: 0.4 % (ref 0.0–3.0)
Eosinophils Absolute: 0.2 10*3/uL (ref 0.0–0.7)
Eosinophils Relative: 1.8 % (ref 0.0–5.0)
HCT: 43 % (ref 36.0–46.0)
Hemoglobin: 15 g/dL (ref 12.0–15.0)
Lymphocytes Relative: 18.4 % (ref 12.0–46.0)
Lymphs Abs: 1.8 10*3/uL (ref 0.7–4.0)
MCHC: 34.8 g/dL (ref 30.0–36.0)
MCV: 89.3 fl (ref 78.0–100.0)
Monocytes Absolute: 0.8 10*3/uL (ref 0.1–1.0)
Monocytes Relative: 8.6 % (ref 3.0–12.0)
Neutro Abs: 6.7 10*3/uL (ref 1.4–7.7)
Neutrophils Relative %: 70.8 % (ref 43.0–77.0)
Platelets: 218 10*3/uL (ref 150.0–400.0)
RBC: 4.82 Mil/uL (ref 3.87–5.11)
RDW: 11.7 % (ref 11.5–14.6)
WBC: 9.5 10*3/uL (ref 4.5–10.5)

## 2013-08-23 LAB — URINALYSIS, ROUTINE W REFLEX MICROSCOPIC
Bilirubin Urine: NEGATIVE
Ketones, ur: NEGATIVE
Leukocytes, UA: NEGATIVE
Nitrite: NEGATIVE
RBC / HPF: NONE SEEN (ref 0–?)
Specific Gravity, Urine: 1.02 (ref 1.000–1.030)
Total Protein, Urine: NEGATIVE
Urine Glucose: NEGATIVE
Urobilinogen, UA: 0.2 (ref 0.0–1.0)
pH: 6 (ref 5.0–8.0)

## 2013-08-23 LAB — TSH: TSH: 1.06 u[IU]/mL (ref 0.35–5.50)

## 2013-08-23 LAB — LDL CHOLESTEROL, DIRECT: Direct LDL: 129.2 mg/dL

## 2013-08-23 LAB — D-DIMER, QUANTITATIVE: D-Dimer, Quant: 0.27 ug/mL-FEU (ref 0.00–0.48)

## 2013-08-23 MED ORDER — AZITHROMYCIN 250 MG PO TABS: ORAL_TABLET | ORAL | Status: DC

## 2013-08-23 NOTE — Patient Instructions (Addendum)
It was good to see you today.  We have reviewed your prior records including labs and tests today  Health Maintenance reviewed - all recommended immunizations and age-appropriate screenings are up-to-date. Get your flu shot soon!  Test(s) ordered today. Your results will be released to MyChart (or called to you) after review, usually within 72hours after test completion. If any changes need to be made, you will be notified at that same time.  Medications reviewed and updated: Do not start birth control pill at this time Azithromycin antibiotics as discussed for cough symptoms  Your prescription(s) have been submitted to your pharmacy. Please take as directed and contact our office if you believe you are having problem(s) with the medication(s).  Monitor your blood pressure and call us if it remains over 140/100 for adjustment of medications as needed  Followup in 3 months to review blood pressure and other medical issues, please call sooner if problems   Health Maintenance, Female A healthy lifestyle and preventative care can promote health and wellness.  Maintain regular health, dental, and eye exams.  Eat a healthy diet. Foods like vegetables, fruits, whole grains, low-fat dairy products, and lean protein foods contain the nutrients you need without too many calories. Decrease your intake of foods high in solid fats, added sugars, and salt. Get information about a proper diet from your caregiver, if necessary.  Regular physical exercise is one of the most important things you can do for your health. Most adults should get at least 150 minutes of moderate-intensity exercise (any activity that increases your heart rate and causes you to sweat) each week. In addition, most adults need muscle-strengthening exercises on 2 or more days a week.   Maintain a healthy weight. The body mass index (BMI) is a screening tool to identify possible weight problems. It provides an estimate of body fat  based on height and weight. Your caregiver can help determine your BMI, and can help you achieve or maintain a healthy weight. For adults 20 years and older:  A BMI below 18.5 is considered underweight.  A BMI of 18.5 to 24.9 is normal.  A BMI of 25 to 29.9 is considered overweight.  A BMI of 30 and above is considered obese.  Maintain normal blood lipids and cholesterol by exercising and minimizing your intake of saturated fat. Eat a balanced diet with plenty of fruits and vegetables. Blood tests for lipids and cholesterol should begin at age 61 and be repeated every 5 years. If your lipid or cholesterol levels are high, you are over 50, or you are a high risk for heart disease, you may need your cholesterol levels checked more frequently.Ongoing high lipid and cholesterol levels should be treated with medicines if diet and exercise are not effective.  If you smoke, find out from your caregiver how to quit. If you do not use tobacco, do not start.  Lung cancer screening is recommended for adults aged 33 80 years who are at high risk for developing lung cancer because of a history of smoking. Yearly low-dose computed tomography (CT) is recommended for people who have at least a 30-pack-year history of smoking and are a current smoker or have quit within the past 15 years. A pack year of smoking is smoking an average of 1 pack of cigarettes a day for 1 year (for example: 1 pack a day for 30 years or 2 packs a day for 15 years). Yearly screening should continue until the smoker has stopped smoking for  at least 15 years. Yearly screening should also be stopped for people who develop a health problem that would prevent them from having lung cancer treatment.  If you are pregnant, do not drink alcohol. If you are breastfeeding, be very cautious about drinking alcohol. If you are not pregnant and choose to drink alcohol, do not exceed 1 drink per day. One drink is considered to be 12 ounces (355 mL) of  beer, 5 ounces (148 mL) of wine, or 1.5 ounces (44 mL) of liquor.  Avoid use of street drugs. Do not share needles with anyone. Ask for help if you need support or instructions about stopping the use of drugs.  High blood pressure causes heart disease and increases the risk of stroke. Blood pressure should be checked at least every 1 to 2 years. Ongoing high blood pressure should be treated with medicines, if weight loss and exercise are not effective.  If you are 65 to 33 years old, ask your caregiver if you should take aspirin to prevent strokes.  Diabetes screening involves taking a blood sample to check your fasting blood sugar level. This should be done once every 3 years, after age 49, if you are within normal weight and without risk factors for diabetes. Testing should be considered at a younger age or be carried out more frequently if you are overweight and have at least 1 risk factor for diabetes.  Breast cancer screening is essential preventative care for women. You should practice "breast self-awareness." This means understanding the normal appearance and feel of your breasts and may include breast self-examination. Any changes detected, no matter how small, should be reported to a caregiver. Women in their 61s and 30s should have a clinical breast exam (CBE) by a caregiver as part of a regular health exam every 1 to 3 years. After age 52, women should have a CBE every year. Starting at age 77, women should consider having a mammogram (breast X-ray) every year. Women who have a family history of breast cancer should talk to their caregiver about genetic screening. Women at a high risk of breast cancer should talk to their caregiver about having an MRI and a mammogram every year.  Breast cancer gene (BRCA)-related cancer risk assessment is recommended for women who have family members with BRCA-related cancers. BRCA-related cancers include breast, ovarian, tubal, and peritoneal cancers. Having  family members with these cancers may be associated with an increased risk for harmful changes (mutations) in the breast cancer genes BRCA1 and BRCA2. Results of the assessment will determine the need for genetic counseling and BRCA1 and BRCA2 testing.  The Pap test is a screening test for cervical cancer. Women should have a Pap test starting at age 73. Between ages 48 and 17, Pap tests should be repeated every 2 years. Beginning at age 75, you should have a Pap test every 3 years as long as the past 3 Pap tests have been normal. If you had a hysterectomy for a problem that was not cancer or a condition that could lead to cancer, then you no longer need Pap tests. If you are between ages 35 and 42, and you have had normal Pap tests going back 10 years, you no longer need Pap tests. If you have had past treatment for cervical cancer or a condition that could lead to cancer, you need Pap tests and screening for cancer for at least 20 years after your treatment. If Pap tests have been discontinued, risk factors (such as  a new sexual partner) need to be reassessed to determine if screening should be resumed. Some women have medical problems that increase the chance of getting cervical cancer. In these cases, your caregiver may recommend more frequent screening and Pap tests.  The human papillomavirus (HPV) test is an additional test that may be used for cervical cancer screening. The HPV test looks for the virus that can cause the cell changes on the cervix. The cells collected during the Pap test can be tested for HPV. The HPV test could be used to screen women aged 11 years and older, and should be used in women of any age who have unclear Pap test results. After the age of 58, women should have HPV testing at the same frequency as a Pap test.  Colorectal cancer can be detected and often prevented. Most routine colorectal cancer screening begins at the age of 2 and continues through age 47. However, your  caregiver may recommend screening at an earlier age if you have risk factors for colon cancer. On a yearly basis, your caregiver may provide home test kits to check for hidden blood in the stool. Use of a small camera at the end of a tube, to directly examine the colon (sigmoidoscopy or colonoscopy), can detect the earliest forms of colorectal cancer. Talk to your caregiver about this at age 79, when routine screening begins. Direct examination of the colon should be repeated every 5 to 10 years through age 79, unless early forms of pre-cancerous polyps or small growths are found.  Hepatitis C blood testing is recommended for all people born from [birth year] through 1966 and any individual with known risks for hepatitis C.  Practice safe sex. Use condoms and avoid high-risk sexual practices to reduce the spread of sexually transmitted infections (STIs). Sexually active women aged 73 and younger should be checked for Chlamydia, which is a common sexually transmitted infection. Older women with new or multiple partners should also be tested for Chlamydia. Testing for other STIs is recommended if you are sexually active and at increased risk.  Osteoporosis is a disease in which the bones lose minerals and strength with aging. This can result in serious bone fractures. The risk of osteoporosis can be identified using a bone density scan. Women ages 75 and over and women at risk for fractures or osteoporosis should discuss screening with their caregivers. Ask your caregiver whether you should be taking a calcium supplement or vitamin D to reduce the rate of osteoporosis.  Menopause can be associated with physical symptoms and risks. Hormone replacement therapy is available to decrease symptoms and risks. You should talk to your caregiver about whether hormone replacement therapy is right for you.  Use sunscreen. Apply sunscreen liberally and repeatedly throughout the day. You should seek shade when your shadow is  shorter than you. Protect yourself by wearing long sleeves, pants, a wide-brimmed hat, and sunglasses year round, whenever you are outdoors.  Notify your caregiver of new moles or changes in moles, especially if there is a change in shape or color. Also notify your caregiver if a mole is larger than the size of a pencil eraser.  Stay current with your immunizations. Document Released: 03/30/2011 Document Revised: 01/09/2013 Document Reviewed: 03/30/2011 [facility] Patient Information 2014 [place], [place].

## 2013-08-23 NOTE — Progress Notes (Signed)
Subjective:    Patient ID: Alison [name], female    DOB: 04-21-1981, 32 y.o.   MRN: [id number]  HPI  Last OV 2012 -  patient is here today for annual physical. Patient feels well in general  Today complains of URI symptoms - cough for 2-1/2 weeks, associated with green sputum and low-grade fever. Sick contacts with family experiencing same. Child began on antibiotics for bronchitis.  Hypertension. Concerned elevation precipitated by birth control -has been recommended to begin lower dose birth control by gynecologist, but has not yet begun same. On HCTZ for last 5 days -no chest pain, headache, edema -complication of hypertension during in the gestation spring 2013, but reports normalized blood pressure after discontinuation of pregnancy and IUD  Past Medical History  Diagnosis Date  . DEPRESSION   . ALLERGIC RHINITIS   . RENAL CALCULUS, HX OF   . Gestational hypertension 11/2011  . Normal labor and delivery (IOL - Pitocin) 12/26/2011  . PP care - s/p NVB 3/30 12/28/2011  . Hypertension    Family History  Problem Relation Age of Onset  . Arthritis Mother   . Arthritis Father   . Arthritis Maternal Grandmother     Rheumatoid Arthritis  . Diabetes Other   . Hypertension Other     grandparent  . Stroke Other     grandparent   History  Substance Use Topics  . Smoking status: Never Smoker   . Smokeless tobacco: Not on file     Comment: Married, lives with spouse. 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 Alcohol Use: No    Review of Systems  Constitutional: Negative for fatigue and unexpected weight change.  Respiratory: Positive for cough. Negative for shortness of breath and wheezing.   Cardiovascular: Negative for chest pain, palpitations and leg swelling.  Gastrointestinal: Negative for nausea, abdominal pain and diarrhea.  Neurological: Negative for dizziness, weakness, light-headedness and headaches.   Psychiatric/Behavioral: Negative for dysphoric mood. The patient is not nervous/anxious.   All other systems reviewed and are negative.       Objective:   Physical Exam BP 132/98  Pulse 110  Temp(Src) 98.7 F (37.1 C) (Oral)  Ht 5\' 2"  (1.575 m)  Wt 136 lb (61.689 kg)  BMI 24.87 kg/m2  SpO2 93% Wt Readings from Last 3 Encounters:  08/23/13 136 lb (61.689 kg)  12/26/11 180 lb (81.647 kg)  12/31/10 138 lb (62.596 kg)   Constitutional: She appears well-developed and well-nourished. No distress.  HENT: Head: Normocephalic and atraumatic. Sinus mildly tender B max region Ears: B TMs ok, no erythema or effusion; Nose: turbinate swelling. Mouth/Throat: Oropharynx is clear and moist. No oropharyngeal exudate.  Eyes: Conjunctivae and EOM are normal. Pupils are equal, round, and reactive to light. No scleral icterus.  Neck: Normal range of motion. Neck supple. No JVD present. No thyromegaly present.  Cardiovascular: Normal rate, regular rhythm and normal heart sounds.  No murmur heard. No BLE edema. Pulmonary/Chest: Effort normal. breath sounds with few scattered rhonchi, r>l. No respiratory distress. She has no wheezes.  Abdominal: Soft. Bowel sounds are normal. She exhibits no distension. There is no tenderness. no masses Musculoskeletal: Normal range of motion, no joint effusions. No gross deformities Neurological: She is alert and oriented to person, place, and time. No cranial nerve deficit. Coordination, balance, strength, speech and gait are normal.  Skin: Skin is warm and dry. No rash noted. No erythema.  Psychiatric: She has a normal  mood and affect. Her behavior is normal. Judgment and thought content normal.   Lab Results  Component Value Date   WBC 16.0* 12/28/2011   HGB 10.7* 12/28/2011   HCT 31.0* 12/28/2011   PLT 136* 12/28/2011   GLUCOSE 82 12/28/2011   CHOL 169 01/31/2010   TRIG 96.0 01/31/2010   HDL 78.90 01/31/2010   LDLCALC 71 01/31/2010   ALT 35 12/28/2011   AST 49* 12/28/2011    NA 138 12/28/2011   K 3.9 12/28/2011   CL 106 12/28/2011   CREATININE 0.87 12/28/2011   BUN 12 12/28/2011   CO2 26 12/28/2011   TSH 1.32 01/31/2010       Assessment & Plan:    CPX/v70.0 - Patient has been counseled on age-appropriate routine health concerns for screening and prevention. These are reviewed and up-to-date. Immunizations are up-to-date or declined. Labs and ECG reviewed.  Cough - R rhonchi with ?bronchopna - Kid with same, began antibiotics yesterday - Zpak and symptomatic care - check DDimer and CT chest if abn  Also see problem list. Medications and labs reviewed today.

## 2013-08-23 NOTE — Assessment & Plan Note (Signed)
Exacerbated by birth control pill and pregnancy spring 2013 Recently discontinued birth control pill, begin HCTZ last week by gynecology midwife Discussed addition of additional medications, patient believes blood pressure will normalize in the next several days as hormones diminished I advised to not begin low-dose hormone until her pressure stabilized Check d-dimer and other labs as with physical above Patient to monitor her blood pressure at home, call if remains uncontrolled per medication changes as discussed

## 2013-08-23 NOTE — Progress Notes (Signed)
Pre visit review using our clinic review tool, if applicable. No additional management support is needed unless otherwise documented below in the visit note. 

## 2014-03-13 ENCOUNTER — Encounter: Payer: Self-pay | Admitting: Internal Medicine

## 2014-03-13 ENCOUNTER — Other Ambulatory Visit (INDEPENDENT_AMBULATORY_CARE_PROVIDER_SITE_OTHER): Payer: 59

## 2014-03-13 ENCOUNTER — Ambulatory Visit (INDEPENDENT_AMBULATORY_CARE_PROVIDER_SITE_OTHER): Payer: 59 | Admitting: Internal Medicine

## 2014-03-13 VITALS — BP 112/82 | HR 96 | Temp 98.5°F | Wt 143.4 lb

## 2014-03-13 DIAGNOSIS — F329 Major depressive disorder, single episode, unspecified: Secondary | ICD-10-CM

## 2014-03-13 DIAGNOSIS — F3289 Other specified depressive episodes: Secondary | ICD-10-CM

## 2014-03-13 DIAGNOSIS — G253 Myoclonus: Secondary | ICD-10-CM

## 2014-03-13 LAB — CBC WITH DIFFERENTIAL/PLATELET
Basophils Absolute: 0 10*3/uL (ref 0.0–0.1)
Basophils Relative: 0.5 % (ref 0.0–3.0)
Eosinophils Absolute: 0.2 10*3/uL (ref 0.0–0.7)
Eosinophils Relative: 2.4 % (ref 0.0–5.0)
HCT: 40.8 % (ref 36.0–46.0)
Hemoglobin: 13.9 g/dL (ref 12.0–15.0)
Lymphocytes Relative: 28.7 % (ref 12.0–46.0)
Lymphs Abs: 1.8 10*3/uL (ref 0.7–4.0)
MCHC: 34.1 g/dL (ref 30.0–36.0)
MCV: 90.9 fl (ref 78.0–100.0)
Monocytes Absolute: 0.5 10*3/uL (ref 0.1–1.0)
Monocytes Relative: 7.7 % (ref 3.0–12.0)
Neutro Abs: 3.9 10*3/uL (ref 1.4–7.7)
Neutrophils Relative %: 60.7 % (ref 43.0–77.0)
Platelets: 195 10*3/uL (ref 150.0–400.0)
RBC: 4.49 Mil/uL (ref 3.87–5.11)
RDW: 12 % (ref 11.5–15.5)
WBC: 6.4 10*3/uL (ref 4.0–10.5)

## 2014-03-13 LAB — HEPATIC FUNCTION PANEL
ALT: 16 U/L (ref 0–35)
AST: 21 U/L (ref 0–37)
Albumin: 4.1 g/dL (ref 3.5–5.2)
Alkaline Phosphatase: 66 U/L (ref 39–117)
Bilirubin, Direct: 0.1 mg/dL (ref 0.0–0.3)
Total Bilirubin: 0.2 mg/dL (ref 0.2–1.2)
Total Protein: 7 g/dL (ref 6.0–8.3)

## 2014-03-13 LAB — VITAMIN B12: Vitamin B-12: 358 pg/mL (ref 211–911)

## 2014-03-13 LAB — BASIC METABOLIC PANEL
BUN: 17 mg/dL (ref 6–23)
CO2: 29 mEq/L (ref 19–32)
Calcium: 9.8 mg/dL (ref 8.4–10.5)
Chloride: 102 mEq/L (ref 96–112)
Creatinine, Ser: 0.8 mg/dL (ref 0.4–1.2)
GFR: 84.98 mL/min (ref >60.00)
Glucose, Bld: 94 mg/dL (ref 70–99)
Potassium: 4 mEq/L (ref 3.5–5.1)
Sodium: 136 mEq/L (ref 135–145)

## 2014-03-13 LAB — FERRITIN: Ferritin: 37.1 ng/mL (ref 10.0–291.0)

## 2014-03-13 LAB — TSH: TSH: 1.35 u[IU]/mL (ref 0.35–4.50)

## 2014-03-13 NOTE — Progress Notes (Signed)
Pre visit review using our clinic review tool, if applicable. No additional management support is needed unless otherwise documented below in the visit note. 

## 2014-03-13 NOTE — Patient Instructions (Addendum)
It was good to see you today.  We have reviewed your prior records including labs and tests today  Test(s) ordered today. Your results will be released to [place] (or called to you) after review, usually within 72hours after test completion. If any changes need to be made, you will be notified at that same time.  Also will make referral for MRI brain. My office will contact you regarding appointment once made. will then be contacted with results after review  Medications reviewed and updated, no changes recommended at this time. Consider change in Effexor versus neurology or sleep evaluation based on results and your symptoms  Please schedule followup in 6 months for review, call sooner if problems.  Myoclonus Myoclonus is a term that refers to brief, involuntary twitching or jerking of a muscle or a group of muscles. It describes a symptom, and generally, is not a diagnosis of a disease. The myoclonic twitches or jerks are usually caused by sudden muscle contractions. They also can result from brief lapses of contraction. Myoclonic twitches or jerks may occur:  Alone or in sequence.  In a pattern or without pattern.  Infrequently or many times each minute. Often times, myoclonus is one of several symptoms in a wide variety of nervous system disorders such as:  Multiple sclerosis.  Parkinson's disease.  Alzheimer's disease.  Creutzfeldt-Jakob disease. Familiar examples of normal myoclonus include:  Hiccups and jerks.  "Sleep starts" that some people have while drifting off to sleep. Severe cases can severely limit a person's ability to:  Eat.  Talk.  Walk. Myoclonic jerks commonly occur in individuals with epilepsy. The most common types of myoclonus include:  Action.  Cortical reflex.  Essential.  Palatal.  Progressive myoclonus epilepsy.  Reticular reflex.  Sleep.  Stimulus-sensitive. TREATMENT  Treatment for myoclonus consists of medicines that may help  reduce symptoms. These drugs (many of which are also used to treat epilepsy) include:   Barbiturates.  Clonazepam.  Phenytoin.  Primidone.  Sodium valproate. The complex origins of myoclonus may require the use of multiple drugs. Document Released: 09/04/2002 Document Revised: 12/07/2011 Document Reviewed: 09/14/2005 [facility] Patient Information 2014 [place].

## 2014-03-13 NOTE — Progress Notes (Signed)
Subjective:    Patient ID: Alison [name], female    DOB: 1981-04-14, 33 y.o.   MRN: [id number]  HPI  Patient is here for "jerking episodes" Onset 6 weeks ago Occurs at night - initially only when falling asleep -now will occur while asleep per spouse Began R leg, now R arm, ?bilateral Denies association with other neuro symptoms, see review of systems below Denies medication changes or emotional stressors, but admits to chronic "stress and nerves" On Effexor for depression for years without adverse side effects, follows with psychiatry nurse practitioner present  Also reviewed chronic medical issues and interval medical events  Past Medical History  Diagnosis Date  . DEPRESSION   . ALLERGIC RHINITIS   . RENAL CALCULUS, HX OF   . Gestational hypertension 11/2011  . Normal labor and delivery (IOL - Pitocin) 12/26/2011  . PP care - s/p NVB 3/30 12/28/2011  . Hypertension     Review of Systems  Constitutional: Negative for fatigue and unexpected weight change.  Eyes: Negative for visual disturbance.  Respiratory: Negative for cough and shortness of breath.   Cardiovascular: Negative for chest pain and leg swelling.  Neurological: Positive for tremors. Negative for dizziness, syncope, facial asymmetry, speech difficulty, weakness, light-headedness, numbness and headaches. Seizures: see HPI re: tremor and jerk but no LOC.  Psychiatric/Behavioral: Negative for sleep disturbance (movement while asleep disrupting spouse, not pt) and dysphoric mood.       Objective:   Physical Exam  BP 112/82  Pulse 96  Temp(Src) 98.5 F (36.9 C) (Oral)  Wt 143 lb 6.4 oz (65.046 kg)  SpO2 96% Wt Readings from Last 3 Encounters:  03/13/14 143 lb 6.4 oz (65.046 kg)  08/23/13 136 lb (61.689 kg)  12/26/11 180 lb (81.647 kg)   Constitutional: She appears well-developed and well-nourished. No distress.  Eyes: EOMI, PERRLA - no nystagmus Neck: Normal range of motion. Neck supple. No JVD present. No  thyromegaly present.  Cardiovascular: Normal rate, regular rhythm and normal heart sounds.  No murmur heard. No BLE edema. Pulmonary/Chest: Effort normal and breath sounds normal. No respiratory distress. She has no wheezes.  Neurologic:  Awake, alert, oriented x4. Cranial nerves II through XII symmetrically intact. speech fluent. Gait and balance normal. Negative Romberg. Normal finger to nose. Deep tendon reflexes 2+ bilateral patella Psychiatric: She has a normal mood and affect. Her behavior is normal. Judgment and thought content normal.   Lab Results  Component Value Date   WBC 9.5 08/23/2013   HGB 15.0 08/23/2013   HCT 43.0 08/23/2013   PLT 218.0 08/23/2013   GLUCOSE 90 08/23/2013   CHOL 205* 08/23/2013   TRIG 97.0 08/23/2013   HDL 59.50 08/23/2013   LDLDIRECT 129.2 08/23/2013   LDLCALC 71 01/31/2010   ALT 17 08/23/2013   AST 21 08/23/2013   NA 137 08/23/2013   K 3.7 08/23/2013   CL 102 08/23/2013   CREATININE 0.8 08/23/2013   BUN 18 08/23/2013   CO2 29 08/23/2013   TSH 1.06 08/23/2013    US Breast Right  07/12/2012   *RADIOLOGY REPORT*  Clinical Data:  Recent history of right breast mastitis treated 3 weeks ago.  The patient stated her symptoms resolved after antibiotic but in the last week she is developing pain and hardness in the same area.  RIGHT BREAST ULTRASOUND  Comparison:  None.  On physical exam, I do not palpate a focal discrete mass. There is no skin with a thickness of redness in the palpable  area upper outer quadrant right breast.  Findings: Ultrasound is performed, showing no focal discrete cystic or solid lesion in the upper outer quadrant right breast.  IMPRESSION: Negative.  RECOMMENDATION: Management clinical basis.  BI-RADS CATEGORY 1:  Negative.   Original Report Authenticated By: Sherian Rein, M.D.       Assessment & Plan:   Myoclonic spells. Initially while falling asleep, now occurring at night with involuntary movements. Patient believes these to be  more right-sided, but cannot exclude bilateral upper extremity tremors while sleeping her spouse. Not associated with medication changes or other neuro symptoms. Possibility of relationship to SSRI therapy reviewed. Will check labs to exclude metabolic condition and refer for MRI to exclude multiple sclerosis. If labs and MRI unremarkable, consider sleep evaluation versus neuro input versus change in psychotropic medications. No medication changes recommended at this time pending further lab results and workup  Problem List Items Addressed This Visit   DEPRESSION   Relevant Medications      venlafaxine XR (EFFEXOR-XR) 150 MG 24 hr capsule    Other Visit Diagnoses   Myoclonic jerking    -  Primary    Relevant Orders       MR Brain Wo Contrast       CBC with Differential       TSH       Basic metabolic panel       Hepatic function panel       Vitamin B12       Ferritin

## 2014-03-16 ENCOUNTER — Ambulatory Visit
Admission: RE | Admit: 2014-03-16 | Discharge: 2014-03-16 | Disposition: A | Discharge status: Home / Self Care | Payer: 59 | Source: Ambulatory Visit | Attending: Internal Medicine | Admitting: Internal Medicine

## 2014-03-16 DIAGNOSIS — G253 Myoclonus: Secondary | ICD-10-CM

## 2014-03-20 ENCOUNTER — Encounter: Payer: Self-pay | Admitting: Internal Medicine

## 2014-03-20 ENCOUNTER — Other Ambulatory Visit: Payer: Self-pay | Admitting: Internal Medicine

## 2014-03-20 DIAGNOSIS — R253 Fasciculation: Secondary | ICD-10-CM

## 2014-03-20 DIAGNOSIS — R930 Abnormal findings on diagnostic imaging of skull and head, not elsewhere classified: Secondary | ICD-10-CM

## 2014-03-27 ENCOUNTER — Encounter: Payer: Self-pay | Admitting: Diagnostic Neuroimaging

## 2014-03-27 ENCOUNTER — Ambulatory Visit (INDEPENDENT_AMBULATORY_CARE_PROVIDER_SITE_OTHER): Payer: 59 | Admitting: Diagnostic Neuroimaging

## 2014-03-27 VITALS — BP 134/94 | HR 90 | Temp 99.4°F | Ht 62.0 in | Wt 144.5 lb

## 2014-03-27 DIAGNOSIS — G4761 Periodic limb movement disorder: Secondary | ICD-10-CM

## 2014-03-27 DIAGNOSIS — F518 Other sleep disorders not due to a substance or known physiological condition: Secondary | ICD-10-CM

## 2014-03-27 NOTE — Progress Notes (Signed)
[facility]  PATIENT: Alison [name] DOB: 02-Dec-1980  REFERRING CLINICIAN: Felicity Coyer HISTORY FROM: patient  REASON FOR VISIT: new consult   HISTORICAL  CHIEF COMPLAINT:  Chief Complaint  Patient presents with  . Jerks    in sleep and sometimes at rest  . Pain    wrist, bilateral, for the past 3 yrs  . Numbness    in hands    HISTORY OF PRESENT ILLNESS:   33 year old female with history of depression, anxiety, basal cell carcinoma, here for evaluation of intermittent muscle jerks.  For past 2 months patient reports increasing muscle jerks in arms or legs especially as she is falling asleep. Her spouse is noted intermittent muscle jerks of arms and legs even after falling asleep. Symptoms seem to be increasing over the past 2 months. She's never had these symptoms significantly the past. No recent change in medication, stress, sleep, caffeine. Patient reports stable stress levels. She denies any urge to move her legs while she is awake. No creepy crawly, uncomfortable, cramp sensations in the legs. No snoring. No significant daytime fatigue or sleepiness. No insomnia.  For past 2 years patient had intermittent hand pain, hand numbness symptoms. No persistent numbness. No weakness or balance difficulty.   REVIEW OF SYSTEMS: Full 14 system review of systems performed and notable only for involuntary movements of legs, involuntary tremors with sleep, depression, anxiety, allergies and sensitivity constipation intermittent ringing in ears.  ALLERGIES: No Known Allergies  HOME MEDICATIONS: Outpatient Prescriptions Prior to Visit  Medication Sig Dispense Refill  . desloratadine (CLARINEX) 5 MG tablet Take 5 mg by mouth daily.      . fluticasone (FLONASE) 50 MCG/ACT nasal spray Place into both nostrils daily.      . Multiple Vitamin (MULTIVITAMIN WITH MINERALS) TABS tablet Take 1 tablet by mouth daily.      . Psyllium-Calcium (METAMUCIL PLUS CALCIUM) CAPS Take 2  capsules by mouth every morning.      . venlafaxine XR (EFFEXOR-XR) 150 MG 24 hr capsule Take 150 mg by mouth daily with breakfast.       No facility-administered medications prior to visit.    PAST MEDICAL HISTORY: Past Medical History  Diagnosis Date  . DEPRESSION   . ALLERGIC RHINITIS   . RENAL CALCULUS, HX OF   . Gestational hypertension 11/2011  . Normal labor and delivery (IOL - Pitocin) 12/26/2011  . PP care - s/p NVB 3/30 12/28/2011  . Hypertension   . Anxiety   . Cancer 03/2014    basal carcinoma    PAST SURGICAL HISTORY: Past Surgical History  Procedure Laterality Date  . Mohs surgery  03/2014    BCC on nose.    FAMILY HISTORY: Family History  Problem Relation Age of Onset  . Arthritis Mother   . Ankylosing spondylitis Mother   . Arthritis Father   . Other Father     car accident  . Arthritis Maternal Grandmother     Rheumatoid Arthritis  . Breast cancer Maternal Grandmother   . Diabetes Other   . Hypertension Other     grandparent  . Stroke Other     grandparent    SOCIAL HISTORY:  History   Social History  . Marital Status: Married    Spouse Name: Dennard Nip    Number of Children: 1  . Years of Education: MA   Occupational History  . 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      Social  History Main Topics  . Smoking status: Never Smoker   . Smokeless tobacco: Never Used     Comment: Married, lives with spouse. 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 Alcohol Use: Yes     Comment: 1 drink every two weeks  . Drug Use: No  . Sexual Activity: Yes   Other Topics Concern  . Not on file   Social History Narrative   Patient lives at home with family.   Caffeine Use: 1-2 cups daily     PHYSICAL EXAM  Filed Vitals:   03/27/14 0857  BP: 134/94  Pulse: 90  Temp: 99.4 F (37.4 C)  TempSrc: Oral  Height: 5\' 2"  (1.575 m)  Weight: 144 lb 8 oz (65.545 kg)    Not recorded    Body mass index is  26.42 kg/(m^2).  GENERAL EXAM: Patient is in no distress; well developed, nourished and groomed; neck is supple  CARDIOVASCULAR: Regular rate and rhythm, no murmurs, no carotid bruits  NEUROLOGIC: MENTAL STATUS: awake, alert, oriented to person, place and time, recent and remote memory intact, normal attention and concentration, language fluent, comprehension intact, naming intact, fund of knowledge appropriate CRANIAL NERVE: no papilledema on fundoscopic exam, pupils equal and reactive to light, visual fields full to confrontation, extraocular muscles intact, no nystagmus, facial sensation and strength symmetric, hearing intact, palate elevates symmetrically, uvula midline, shoulder shrug symmetric, tongue midline. MOTOR: normal bulk and tone, full strength in the BUE, BLE SENSORY: normal and symmetric to light touch, pinprick, temperature, vibration  COORDINATION: finger-nose-finger, fine finger movements normal REFLEXES: deep tendon reflexes present and symmetric GAIT/STATION: narrow based gait; able to walk on toes, heels and tandem; romberg is negative    DIAGNOSTIC DATA (LABS, IMAGING, TESTING) - I reviewed patient records, labs, notes, testing and imaging myself where available.  Lab Results  Component Value Date   WBC 6.4 03/13/2014   HGB 13.9 03/13/2014   HCT 40.8 03/13/2014   MCV 90.9 03/13/2014   PLT 195.0 03/13/2014      Component Value Date/Time   NA 136 03/13/2014 1200   K 4.0 03/13/2014 1200   CL 102 03/13/2014 1200   CO2 29 03/13/2014 1200   GLUCOSE 94 03/13/2014 1200   BUN 17 03/13/2014 1200   CREATININE 0.8 03/13/2014 1200   CALCIUM 9.8 03/13/2014 1200   PROT 7.0 03/13/2014 1200   ALBUMIN 4.1 03/13/2014 1200   AST 21 03/13/2014 1200   ALT 16 03/13/2014 1200   ALKPHOS 66 03/13/2014 1200   BILITOT 0.2 03/13/2014 1200   GFRNONAA 88* 12/28/2011 0700   GFRAA >90 12/28/2011 0700   Lab Results  Component Value Date   CHOL 205* 08/23/2013   HDL 59.50 08/23/2013   LDLCALC 71  01/31/2010   LDLDIRECT 129.2 08/23/2013   TRIG 97.0 08/23/2013   CHOLHDL 3 08/23/2013   No results found for this basename: HGBA1C   Lab Results  Component Value Date   VITAMINB12 358 03/13/2014   Lab Results  Component Value Date   TSH 1.35 03/13/2014    I reviewed images myself, and below is my interpretation.  -VRP  03/20/14 MRI brain - single punctate right right fronto-parietal periventricular focus of non-specific gliosis.   ASSESSMENT AND PLAN  33 y.o. year old female here with 2 months of muscle jerks while falling asleep and after going to sleep. Neuro exam normal. MRI brain shows a single non-specific focus of gliosis, likely within normal limits  vs incidental finding.   Dx: periodic limb movements of sleep (PLMS) + hypnagogic jerks  PLAN: - increase regular physical activity - monitor symptoms - consider gabapentin or pramipexole in future if symptoms persist - consider reducing venlafaxine dose in future - consider sleep study in future if daytime sleepiness / fatigue becomes a problem or if PLMS increases significantly  Return in about 3 months (around 06/27/2014).    Suanne Marker, MD 03/27/2014, 9:59 AM Certified in Neurology, Neurophysiology and Neuroimaging  [facility] [address] [place], [place] [address] [phone]

## 2014-03-27 NOTE — Patient Instructions (Signed)
Try increasing physical activity / exercise.  Monitor stress and sleep patterns.

## 2014-03-28 DIAGNOSIS — C801 Malignant (primary) neoplasm, unspecified: Secondary | ICD-10-CM

## 2014-03-28 HISTORY — DX: Malignant (primary) neoplasm, unspecified: C80.1

## 2014-03-28 HISTORY — PX: MOHS SURGERY: SUR867

## 2014-04-06 ENCOUNTER — Ambulatory Visit: Payer: 59 | Admitting: Neurology

## 2014-04-12 ENCOUNTER — Encounter: Payer: Self-pay | Admitting: Diagnostic Neuroimaging

## 2014-04-17 ENCOUNTER — Telehealth: Payer: Self-pay | Admitting: Diagnostic Neuroimaging

## 2014-04-17 NOTE — Telephone Encounter (Signed)
Patient calling to ask whether her mychart message has been received, wants to discuss getting sleeping medication since she has been exercising 3 times a week and it is not helping her. Please return call to patient and advise.

## 2014-04-18 NOTE — Telephone Encounter (Signed)
Called pt to inform pt that Dr. Leta Baptist is out of the office and will be back next Monday, but the information could be sent to our [facility]. Pt stated that she would wait until Dr. Leta Baptist gets back next Monday. I advised the pt that if she has any other problems, questions or concerns to call the office. Pt verbalized understanding.

## 2014-04-20 MED ORDER — GABAPENTIN 300 MG PO CAPS: 300.0000 mg | ORAL_CAPSULE | Freq: Every day | ORAL | Status: DC

## 2014-04-20 NOTE — Telephone Encounter (Signed)
I called patient. She agrees to try gabapentin at bedtime. Rx sent in. -VRP

## 2014-05-29 ENCOUNTER — Ambulatory Visit: Payer: 59 | Admitting: Diagnostic Neuroimaging

## 2014-06-12 ENCOUNTER — Other Ambulatory Visit: Payer: Self-pay

## 2014-06-22 ENCOUNTER — Ambulatory Visit (INDEPENDENT_AMBULATORY_CARE_PROVIDER_SITE_OTHER): Payer: 59 | Admitting: Diagnostic Neuroimaging

## 2014-06-22 ENCOUNTER — Encounter: Payer: Self-pay | Admitting: Diagnostic Neuroimaging

## 2014-06-22 VITALS — BP 117/82 | HR 95 | Temp 97.8°F | Ht 62.0 in | Wt 148.8 lb

## 2014-06-22 DIAGNOSIS — G4761 Periodic limb movement disorder: Secondary | ICD-10-CM

## 2014-06-22 DIAGNOSIS — F518 Other sleep disorders not due to a substance or known physiological condition: Secondary | ICD-10-CM

## 2014-06-22 NOTE — Progress Notes (Signed)
[facility]  PATIENT: Alison [name] DOB: 12/19/1980  REFERRING CLINICIAN: Felicity Coyer HISTORY FROM: patient  REASON FOR VISIT: new consult   HISTORICAL  CHIEF COMPLAINT:  Chief Complaint  Patient presents with  . Follow-up    HISTORY OF PRESENT ILLNESS:   UPDATE 06/22/14: Since last visit, tried gabapentin for 1-2 weeks, no benefit at 300mg  qhs; then tried 600mg  qhs but had "hangover" feeling next morning. Gradually, spontaneously, symptoms have reduced, almost resolved. Having some intermittent creepy, crawly sensations in face, shoulders, arms. No numbness or weakness.   PRIOR HPI (03/27/14): 33 year old female with history of depression, anxiety, basal cell carcinoma, here for evaluation of intermittent muscle jerks. For past 2 months patient reports increasing muscle jerks in arms or legs especially as she is falling asleep. Her spouse is noted intermittent muscle jerks of arms and legs even after falling asleep. Symptoms seem to be increasing over the past 2 months. She's never had these symptoms significantly the past. No recent change in medication, stress, sleep, caffeine. Patient reports stable stress levels. She denies any urge to move her legs while she is awake. No creepy crawly, uncomfortable, cramp sensations in the legs. No snoring. No significant daytime fatigue or sleepiness. No insomnia. For past 2 years patient had intermittent hand pain, hand numbness symptoms. No persistent numbness. No weakness or balance difficulty.   REVIEW OF SYSTEMS: Full 14 system review of systems performed and notable only for as per HPI. Otherwise neg.   ALLERGIES: No Known Allergies  HOME MEDICATIONS: Outpatient Prescriptions Prior to Visit  Medication Sig Dispense Refill  . desloratadine (CLARINEX) 5 MG tablet Take 5 mg by mouth daily.      . fluticasone (FLONASE) 50 MCG/ACT nasal spray Place into both nostrils daily.      Marland Kitchen gabapentin (NEURONTIN) 300 MG capsule  Take 1-2 capsules (300-600 mg total) by mouth at bedtime.  60 capsule  3  . Multiple Vitamin (MULTIVITAMIN WITH MINERALS) TABS tablet Take 1 tablet by mouth daily.      . Psyllium-Calcium (METAMUCIL PLUS CALCIUM) CAPS Take 2 capsules by mouth every morning.      . venlafaxine XR (EFFEXOR-XR) 150 MG 24 hr capsule Take 150 mg by mouth daily with breakfast.       No facility-administered medications prior to visit.    PAST MEDICAL HISTORY: Past Medical History  Diagnosis Date  . DEPRESSION   . ALLERGIC RHINITIS   . RENAL CALCULUS, HX OF   . Gestational hypertension 11/2011  . Normal labor and delivery (IOL - Pitocin) 12/26/2011  . PP care - s/p NVB 3/30 12/28/2011  . Hypertension   . Anxiety   . Cancer 03/2014    basal carcinoma    PAST SURGICAL HISTORY: Past Surgical History  Procedure Laterality Date  . Mohs surgery  03/2014    BCC on nose.    FAMILY HISTORY: Family History  Problem Relation Age of Onset  . Arthritis Mother   . Ankylosing spondylitis Mother   . Arthritis Father   . Other Father     car accident  . Arthritis Maternal Grandmother     Rheumatoid Arthritis  . Breast cancer Maternal Grandmother   . Diabetes Other   . Hypertension Other     grandparent  . Stroke Other     grandparent    SOCIAL HISTORY:  History   Social History  . Marital Status: Married    Spouse Name: Dennard Nip    Number of  Children: 1  . Years of Education: MA   Occupational History  . 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      Social History Main Topics  . Smoking status: Never Smoker   . Smokeless tobacco: Never Used     Comment: Married, lives with spouse. 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 Alcohol Use: Yes     Comment: 1 drink every two weeks  . Drug Use: No  . Sexual Activity: Yes   Other Topics Concern  . Not on file   Social History Narrative   Patient lives at home with family.   Caffeine Use: 1-2 cups daily       PHYSICAL EXAM  Filed Vitals:   06/22/14 1102  BP: 117/82  Pulse: 95  Temp: 97.8 F (36.6 C)  TempSrc: Oral  Height: 5\' 2"  (1.575 m)  Weight: 148 lb 12.8 oz (67.495 kg)    Not recorded    Body mass index is 27.21 kg/(m^2).  GENERAL EXAM: Patient is in no distress; well developed, nourished and groomed; neck is supple  CARDIOVASCULAR: Regular rate and rhythm, no murmurs, no carotid bruits  NEUROLOGIC: MENTAL STATUS: awake, alert, language fluent, comprehension intact, naming intact, fund of knowledge appropriate CRANIAL NERVE:  pupils equal and reactive to light, visual fields full to confrontation, extraocular muscles intact, no nystagmus, facial sensation and strength symmetric, hearing intact, palate elevates symmetrically, uvula midline, shoulder shrug symmetric, tongue midline. MOTOR: normal bulk and tone, full strength in the BUE, BLE SENSORY: normal and symmetric to light touch  COORDINATION: finger-nose-finger, fine finger movements normal REFLEXES: deep tendon reflexes present and symmetric GAIT/STATION: narrow based gait; able to walk tandem; romberg is negative    DIAGNOSTIC DATA (LABS, IMAGING, TESTING) - I reviewed patient records, labs, notes, testing and imaging myself where available.  Lab Results  Component Value Date   WBC 6.4 03/13/2014   HGB 13.9 03/13/2014   HCT 40.8 03/13/2014   MCV 90.9 03/13/2014   PLT 195.0 03/13/2014      Component Value Date/Time   NA 136 03/13/2014 1200   K 4.0 03/13/2014 1200   CL 102 03/13/2014 1200   CO2 29 03/13/2014 1200   GLUCOSE 94 03/13/2014 1200   BUN 17 03/13/2014 1200   CREATININE 0.8 03/13/2014 1200   CALCIUM 9.8 03/13/2014 1200   PROT 7.0 03/13/2014 1200   ALBUMIN 4.1 03/13/2014 1200   AST 21 03/13/2014 1200   ALT 16 03/13/2014 1200   ALKPHOS 66 03/13/2014 1200   BILITOT 0.2 03/13/2014 1200   GFRNONAA 88* 12/28/2011 0700   GFRAA >90 12/28/2011 0700   Lab Results  Component Value Date   CHOL 205* 08/23/2013    HDL 59.50 08/23/2013   LDLCALC 71 01/31/2010   LDLDIRECT 129.2 08/23/2013   TRIG 97.0 08/23/2013   CHOLHDL 3 08/23/2013   No results found for this basename: HGBA1C   Lab Results  Component Value Date   VITAMINB12 358 03/13/2014   Lab Results  Component Value Date   TSH 1.35 03/13/2014    I reviewed images myself, and below is my interpretation.  -VRP  03/20/14 MRI brain - single punctate right right fronto-parietal periventricular focus of non-specific gliosis.   ASSESSMENT AND PLAN  33 y.o. year old female here with intermittent muscle jerks while falling asleep and after going to sleep. Neuro exam normal. MRI brain shows a single non-specific focus of gliosis, likely within  normal limits vs incidental finding.   Dx: periodic limb movements of sleep (PLMS) + hypnagogic jerks   PLAN: - continue regular physical activity - monitor symptoms - consider gabapentin or pramipexole in future if symptoms persist - consider reducing venlafaxine dose in future - consider sleep study in future if daytime sleepiness / fatigue becomes a problem or if PLMS increases significantly  Return if symptoms worsen or fail to improve.    Suanne Marker, MD 06/22/2014, 11:52 AM Certified in Neurology, Neurophysiology and Neuroimaging  [facility] [address], [address] [place], [place] [address] [phone]

## 2014-06-22 NOTE — Patient Instructions (Signed)
Follow up as needed

## 2014-06-28 ENCOUNTER — Encounter: Payer: Self-pay | Admitting: Internal Medicine

## 2014-07-30 ENCOUNTER — Encounter: Payer: Self-pay | Admitting: Diagnostic Neuroimaging

## 2014-12-17 ENCOUNTER — Telehealth: Payer: Self-pay | Admitting: Internal Medicine

## 2014-12-17 NOTE — Telephone Encounter (Signed)
Pt was previous leschber and will like to est w/ you do to dr Asa Lente leaving. However pt is wanting to get  venlafaxine XR (EFFEXOR-XR) 150 MG 24 hr capsule Will you prescribe?  If so, pt is wanting appt asap to get this med.

## 2014-12-18 NOTE — Telephone Encounter (Signed)
Pt is needing the refill. Unable to get appt w/ dr Asa Lente. Would like appt asap if you will work her in. pls advise

## 2014-12-18 NOTE — Telephone Encounter (Signed)
Ok. Needs new pt visit.

## 2014-12-19 NOTE — Telephone Encounter (Signed)
Lm on vm to cb and sch

## 2014-12-19 NOTE — Telephone Encounter (Signed)
Ok to work in for 30 minute new pt visit. Refill to appointment. Thanks.

## 2014-12-28 ENCOUNTER — Ambulatory Visit (INDEPENDENT_AMBULATORY_CARE_PROVIDER_SITE_OTHER): Payer: 59 | Admitting: Family Medicine

## 2014-12-28 ENCOUNTER — Encounter: Payer: Self-pay | Admitting: Family Medicine

## 2014-12-28 VITALS — BP 108/80 | HR 74 | Temp 98.2°F | Ht 62.0 in | Wt 150.0 lb

## 2014-12-28 DIAGNOSIS — Z7689 Persons encountering health services in other specified circumstances: Secondary | ICD-10-CM

## 2014-12-28 DIAGNOSIS — M549 Dorsalgia, unspecified: Principal | ICD-10-CM

## 2014-12-28 DIAGNOSIS — H1130 Conjunctival hemorrhage, unspecified eye: Secondary | ICD-10-CM | POA: Diagnosis not present

## 2014-12-28 DIAGNOSIS — G8929 Other chronic pain: Secondary | ICD-10-CM

## 2014-12-28 DIAGNOSIS — J302 Other seasonal allergic rhinitis: Secondary | ICD-10-CM

## 2014-12-28 DIAGNOSIS — F418 Other specified anxiety disorders: Secondary | ICD-10-CM

## 2014-12-28 DIAGNOSIS — Z7189 Other specified counseling: Secondary | ICD-10-CM | POA: Diagnosis not present

## 2014-12-28 DIAGNOSIS — F329 Major depressive disorder, single episode, unspecified: Secondary | ICD-10-CM | POA: Insufficient documentation

## 2014-12-28 DIAGNOSIS — F32A Depression, unspecified: Secondary | ICD-10-CM | POA: Insufficient documentation

## 2014-12-28 DIAGNOSIS — Z8269 Family history of other diseases of the musculoskeletal system and connective tissue: Secondary | ICD-10-CM

## 2014-12-28 DIAGNOSIS — F419 Anxiety disorder, unspecified: Secondary | ICD-10-CM

## 2014-12-28 MED ORDER — FLUTICASONE PROPIONATE 50 MCG/ACT NA SUSP: 1.0000 | Freq: Every day | NASAL | Status: DC

## 2014-12-28 MED ORDER — VENLAFAXINE HCL ER 150 MG PO CP24: 150.0000 mg | ORAL_CAPSULE | Freq: Every day | ORAL | Status: DC

## 2014-12-28 NOTE — Patient Instructions (Signed)
We recommend the following healthy lifestyle measures: - eat a healthy diet consisting of lots of vegetables, fruits, beans, nuts, seeds, healthy meats such as white chicken and fish and whole grains.  - avoid fried foods, fast food, processed foods, sodas, red meet and other fattening foods.  - get a least 150 minutes of aerobic exercise per week.    Follow up every 6-12 months

## 2014-12-28 NOTE — Progress Notes (Signed)
Pre visit review using our clinic review tool, if applicable. No additional management support is needed unless otherwise documented below in the visit note. 

## 2014-12-28 NOTE — Progress Notes (Signed)
HPI:  Alison [name] is here to establish care. She used to see Dr. Asa Lente at the [facility] office. Last PCP and physical: goes to [facility] for her annual physical exams.  Has the following chronic problems that require follow up and concerns today:  Seasonal Allergies: -onset: many years ago -symptoms: primarily sneezing and PND and nasal congestion -meds: clarinex, flonase - work ok -seems to be worse in the fall -reports: sees Dr. Harold Hedge, did shots -denies: sob, wheezing  Depression/GAD: -started years ago -symptoms primarily mild anxiety and mild depressed mood -meds: effexor xr 150mg  daily; wants to eventually try to tape roff, but not right now -exercise helps -reports: used to see Dr. Caprice Beaver  -denies: denies SI or any history of hospitalization  Sunconj Hemorrhage: -had two in the past recently -hx of intermittent back pain -mother has RA -she may want to see rheumatologist for rheum evaluation -she denies: fever,s malaise, unexplained wt loss, arthralgias , pain in eyes  ROS negative for unless reported above: fevers, unintentional weight loss, hearing or vision loss, chest pain, palpitations, struggling to breath, hemoptysis, melena, hematochezia, hematuria, falls, loc, si, thoughts of self harm  Past Medical History  Diagnosis Date  . Anxiety and depression   . Seasonal allergies   . Nephrolithiasis   . Gestational hypertension 11/2011  . Cancer 03/2014    basal carcinoma    Past Surgical History  Procedure Laterality Date  . Mohs surgery  03/2014    BCC on nose.    Family History  Problem Relation Age of Onset  . Arthritis Mother   . Ankylosing spondylitis Mother   . Arthritis Father   . Other Father     car accident  . Arthritis Maternal Grandmother     Rheumatoid Arthritis  . Breast cancer Maternal Grandmother   . Diabetes Other   . Hypertension Other     grandparent  . Stroke Other     grandparent    History   Social History   . Marital Status: Married    Spouse Name: Cornelia Copa  . Number of Children: 1  . Years of Education: MA   Occupational History  . 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      Social History Main Topics  . Smoking status: Never Smoker   . Smokeless tobacco: Never Used     Comment: Married, lives with spouse. 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 Alcohol Use: Yes     Comment: 1 drink every two weeks  . Drug Use: No  . Sexual Activity: Yes   Other Topics Concern  . 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  desloratadine (CLARINEX) 5 MG tablet, Take 5 mg by mouth daily., Disp: , Rfl:  .  fluticasone (FLONASE) 50 MCG/ACT nasal spray, Place 1 spray into both nostrils daily., Disp: 16 g, Rfl: 6 .  Multiple Vitamin (MULTIVITAMIN WITH MINERALS) TABS tablet, Take 1 tablet by mouth daily., Disp: , Rfl:  .  venlafaxine XR (EFFEXOR-XR) 150 MG 24 hr capsule, Take 1 capsule (150 mg  total) by mouth daily with breakfast., Disp: 90 capsule, Rfl: 3  EXAM:  Filed Vitals:   12/28/14 1045  BP: 108/80  Pulse: 74  Temp: 98.2 F (36.8 C)    Body mass index is 27.43 kg/(m^2).  GENERAL: vitals reviewed and listed above, alert, oriented, appears well hydrated and in no acute distress  HEENT: atraumatic, conjunttiva clear, no obvious abnormalities on inspection of external nose and ears, normal appearance of ear canals and TMs, clear nasal congestion, mild post oropharyngeal erythema with PND, no tonsillar edema or exudate, no sinus TTP  NECK: no obvious masses on inspection  LUNGS: clear to auscultation bilaterally, no wheezes, rales or rhonchi, good air movement  CV: HRRR, no peripheral edema  MS:  moves all extremities without noticeable abnormality  PSYCH: pleasant and cooperative, no obvious depression or anxiety  ASSESSMENT AND PLAN:  Discussed the following assessment and plan:  Subconjunctival hemorrhage, unspecified laterality -discussed rare potential causes of subconj hemorrhage, though most of the time a benign condition -discussed options for workup or further eval and she will consider -she may wish to see the rheumatologist her mother sees - but will call if wants referral  Seasonal allergies -refilled flonase per her request -sees allergist  Anxiety and depression - Plan: venlafaxine XR (EFFEXOR-XR) 150 MG 24 hr capsule -refilled effexor and discussed options for weaning off - she is not ready at this point but will follow up when wants to do this  Encounter to establish care -We reviewed the PMH, PSH, FH, SH, Meds and Allergies. -We provided refills for any medications we will prescribe as needed. -We addressed current concerns per orders and patient instructions. -We have asked for records for pertinent exams, studies, vaccines and notes from previous providers. -We have advised patient to follow up per instructions below.   -Patient advised to return or notify a doctor immediately if symptoms worsen or persist or new concerns arise.  Patient Instructions  We recommend the following healthy lifestyle measures: - eat a healthy diet consisting of lots of vegetables, fruits, beans, nuts, seeds, healthy meats such as white chicken and fish and whole grains.  - avoid fried foods, fast food, processed foods, sodas, red meet and other fattening foods.  - get a least 150 minutes of aerobic exercise per week.    Follow up every 6-12 months     KIM, HANNAH R.

## 2015-01-14 ENCOUNTER — Emergency Department (INDEPENDENT_AMBULATORY_CARE_PROVIDER_SITE_OTHER)
Admission: EM | Admit: 2015-01-14 | Discharge: 2015-01-14 | Disposition: A | Discharge status: Home / Self Care | Payer: BLUE CROSS/BLUE SHIELD | Source: Home / Self Care | Attending: Family Medicine | Admitting: Family Medicine

## 2015-01-14 ENCOUNTER — Encounter: Payer: Self-pay | Admitting: Emergency Medicine

## 2015-01-14 DIAGNOSIS — J029 Acute pharyngitis, unspecified: Secondary | ICD-10-CM | POA: Diagnosis not present

## 2015-01-14 LAB — POCT RAPID STREP A (OFFICE): Rapid Strep A Screen: NEGATIVE

## 2015-01-14 MED ORDER — PENICILLIN V POTASSIUM 500 MG PO TABS: ORAL_TABLET | ORAL | Status: DC

## 2015-01-14 NOTE — Discharge Instructions (Signed)
Try warm salt water gargles for sore throat.  May take Ibuprofen 200mg , 4 tabs every 8 hours with food.   Salt Water Gargle This solution will help make your mouth and throat feel better. HOME CARE INSTRUCTIONS   Mix 1 teaspoon of salt in 8 ounces of warm water.  Gargle with this solution as much or often as you need or as directed. Swish and gargle gently if you have any sores or wounds in your mouth.  Do not swallow this mixture. Document Released: 06/18/2004 Document Revised: 12/07/2011 Document Reviewed: 11/09/2008 [facility] Patient Information 2015 [place], [place]. This information is not intended to replace advice given to you by your health care provider. Make sure you discuss any questions you have with your health care provider.

## 2015-01-14 NOTE — ED Provider Notes (Signed)
CSN: [id number]     Arrival date & time 01/14/15  1234 History   First MD Initiated Contact with Patient 01/14/15 1304     Chief Complaint  Patient presents with  . Sore Throat     HPI Comments: Patient complains of a sore throat for 3 days.  She had a low grade fever two days ago.  She has a history of seasonal rhinitis, and her present nasal congestion is no worse than usual.   No cough.  Her 35 year old son had confirmed strep pharyngitis 8 days ago.  The history is provided by the patient.    Past Medical History  Diagnosis Date  . Anxiety and depression   . Seasonal allergies   . Nephrolithiasis   . Gestational hypertension 11/2011  . Cancer 03/2014    basal carcinoma   Past Surgical History  Procedure Laterality Date  . Mohs surgery  03/2014    BCC on nose.   Family History  Problem Relation Age of Onset  . Arthritis Mother   . Ankylosing spondylitis Mother   . Arthritis Father   . Other Father     car accident  . Arthritis Maternal Grandmother     Rheumatoid Arthritis  . Breast cancer Maternal Grandmother   . Diabetes Other   . Hypertension Other     grandparent  . Stroke Other     grandparent   History  Substance Use Topics  . Smoking status: Never Smoker   . Smokeless tobacco: Never Used     Comment: Married, lives with spouse. 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 Alcohol Use: Yes     Comment: 1 drink every two weeks   OB History    Gravida Para Term Preterm AB TAB SAB Ectopic Multiple Living   1 1 1       1      Review of Systems + sore throat No cough No pleuritic pain No wheezing + nasal congestion No post-nasal drainage No sinus pain/pressure No itchy/red eyes No earache No hemoptysis No SOB + fever, + chills No nausea No vomiting No abdominal pain No diarrhea No urinary symptoms No skin rash + fatigue No myalgias No headache Used OTC meds without relief  Allergies  Review of  patient's allergies indicates no known allergies.  Home Medications   Prior to Admission medications   Medication Sig Start Date End Date Taking? Authorizing Provider  desloratadine (CLARINEX) 5 MG tablet Take 5 mg by mouth daily.    Historical Provider, MD  fluticasone (FLONASE) 50 MCG/ACT nasal spray Place 1 spray into both nostrils daily. 12/28/14   Lucretia Kern, DO  Multiple Vitamin (MULTIVITAMIN WITH MINERALS) TABS tablet Take 1 tablet by mouth daily.    Historical Provider, MD  penicillin v potassium (VEETID) 500 MG tablet Take one tab by mouth twice daily for 10 days 01/14/15   Kandra Nicolas, MD  venlafaxine XR (EFFEXOR-XR) 150 MG 24 hr capsule Take 1 capsule (150 mg total) by mouth daily with breakfast. 12/28/14   Lucretia Kern, DO   BP 136/89 mmHg  Pulse 75  Temp(Src) 98.6 F (37 C) (Oral)  Resp 16  Ht 5\' 2"  (1.575 m)  Wt 150 lb (68.04 kg)  BMI 27.43 kg/m2  SpO2 98%  LMP 12/17/2014 Physical Exam Nursing notes and Vital Signs reviewed. Appearance:  Patient appears stated age, and in no acute distress Eyes:  Pupils are equal,  round, and reactive to light and accomodation.  Extraocular movement is intact.  Conjunctivae are not inflamed  Ears:  Canals normal.  Tympanic membranes normal.  Nose:  Mildly congested turbinates.  No sinus tenderness.   Pharynx:  Minimal erythema with small aphthous ulcer on uvula Neck:  Supple.  Tender mildly enlarged anterior/posterior nodes are palpated bilaterally  Lungs:  Clear to auscultation.  Breath sounds are equal.  Heart:  Regular rate and rhythm without murmurs, rubs, or gallops.  Skin:  No rash present.   ED Course  Procedures  None    Labs Reviewed  STREP A DNA PROBE  POCT RAPID STREP A (OFFICE) negative         MDM   1. Acute pharyngitis, unspecified pharyngitis type    Throat culture pending. Begin empiric Pen VK 500mg  BID for 10 days. Try warm salt water gargles for sore throat.  May take Ibuprofen 200mg , 4 tabs every 8  hours with food. Followup with Family Doctor if not improved in one week.     Kandra Nicolas, MD 01/14/15 [phone]

## 2015-01-14 NOTE — ED Notes (Signed)
Patient has had a sore throat for 3 days; low grade fever; son dx with Strep 8 days ago. Took ibuprofen at 0730 today.

## 2015-01-15 LAB — STREP A DNA PROBE: GASP: NEGATIVE

## 2015-01-16 ENCOUNTER — Telehealth: Payer: Self-pay | Admitting: *Deleted

## 2015-01-18 ENCOUNTER — Encounter: Payer: Self-pay | Admitting: Family Medicine

## 2015-05-03 ENCOUNTER — Ambulatory Visit: Payer: BLUE CROSS/BLUE SHIELD | Admitting: Family Medicine

## 2016-03-03 ENCOUNTER — Other Ambulatory Visit: Payer: Self-pay | Admitting: Family Medicine

## 2016-04-07 ENCOUNTER — Other Ambulatory Visit: Payer: Self-pay | Admitting: Family Medicine

## 2016-04-07 MED ORDER — VENLAFAXINE HCL ER 150 MG PO CP24: ORAL_CAPSULE | ORAL | Status: DC

## 2016-04-07 NOTE — Telephone Encounter (Signed)
Rx done and the pt was informed via Mychart message. 

## 2016-04-14 ENCOUNTER — Encounter: Payer: Self-pay | Admitting: Family Medicine

## 2016-04-14 ENCOUNTER — Ambulatory Visit (INDEPENDENT_AMBULATORY_CARE_PROVIDER_SITE_OTHER): Payer: BLUE CROSS/BLUE SHIELD | Admitting: Family Medicine

## 2016-04-14 VITALS — BP 128/80 | HR 92 | Temp 98.7°F | Ht 62.0 in | Wt 148.2 lb

## 2016-04-14 DIAGNOSIS — M542 Cervicalgia: Secondary | ICD-10-CM

## 2016-04-14 DIAGNOSIS — F331 Major depressive disorder, recurrent, moderate: Secondary | ICD-10-CM

## 2016-04-14 DIAGNOSIS — R293 Abnormal posture: Secondary | ICD-10-CM

## 2016-04-14 MED ORDER — VENLAFAXINE HCL ER 37.5 MG PO CP24: 37.5000 mg | ORAL_CAPSULE | Freq: Every day | ORAL | Status: DC

## 2016-04-14 NOTE — Patient Instructions (Signed)
BEFORE YOU LEAVE: -follow up: in 1 month -neck spasm exercises  Increase the Effexor to 187.5mg  (one 150mg  cap and one 37.5mg  cap) daily  Cognitive behavioral therapy - either through work or app.  Do the neck exercises 4 days per week and pay attention to posture.

## 2016-04-14 NOTE — Progress Notes (Signed)
HPI:  Follow up: Not seen in > 1 year.  HM: pap smear due - plans to see gyn  Depression/GAD: -started years ago -worsening depression for the last 6 months - increased stress, worsening mildly depressed mood daily, anhedonia -no CBT currently, has been helpful in the past -wants to increase effexor xr - reports on max does in the past and tolerated well -meds: effexor xr 150mg  daily -exercise helps and is walking and doing yoga -reports: used to see Dr. Caprice Beaver  -denies: denies SI or any history of hospitalization, denies panic, thoughts of self harm, psychosis or mania  Chronic neck pain: -neck muscles, poor posture -does a lot of computer work No weakness, numbness, radiation  Seasonal Allergies: -onset: many years ago; reports better when avoids dairy -symptoms: primarily sneezing and PND and nasal congestion -meds: clarinex, flonase - work ok -seems to be worse in the fall -reports: sees Dr. Harold Hedge, did shots -denies: sob, wheezing  Chronic myalgias/arthralgias: -reports saw rheumatologist and eval essentially neg -elimination of gluten from diet has resulted in resolution of symptoms  ROS: See pertinent positives and negatives per HPI.  Past Medical History  Diagnosis Date  . Anxiety and depression   . Seasonal allergies   . Nephrolithiasis   . Gestational hypertension 11/2011  . Cancer ([place]) 03/2014    basal carcinoma    Past Surgical History  Procedure Laterality Date  . Mohs surgery  03/2014    BCC on nose.    Family History  Problem Relation Age of Onset  . Arthritis Mother   . Ankylosing spondylitis Mother   . Arthritis Father   . Other Father     car accident  . Arthritis Maternal Grandmother     Rheumatoid Arthritis  . Breast cancer Maternal Grandmother   . Diabetes Other   . Hypertension Other     grandparent  . Stroke Other     grandparent    Social History   Social History  . Marital Status: Married    Spouse Name: Cornelia Copa   . Number of Children: 1  . Years of Education: MA   Occupational History  . 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      Social History Main Topics  . Smoking status: Never Smoker   . Smokeless tobacco: Never Used     Comment: Married, lives with spouse. 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 Alcohol Use: Yes     Comment: 1 drink every two weeks  . Drug Use: No  . Sexual Activity: Yes   Other Topics Concern  . 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  desloratadine (CLARINEX) 5 MG tablet, Take 5 mg by mouth daily., Disp: , Rfl:  .  fluticasone (FLONASE) 50 MCG/ACT nasal spray, Place 1 spray into both nostrils daily., Disp: 16 g, Rfl: 6 .  Multiple Vitamin (MULTIVITAMIN WITH MINERALS) TABS tablet, Take 1 tablet by mouth daily., Disp: , Rfl:  .  venlafaxine XR (EFFEXOR-XR) 150 MG 24 hr capsule, TAKE 1 CAPSULE (150 MG TOTAL) BY MOUTH DAILY WITH  BREAKFAST., Disp: 30 capsule, Rfl: 0  EXAM:  Filed Vitals:   04/14/16 0936  BP: 128/90  Pulse: 92  Temp: 98.7 F (37.1 C)    Body mass index is 27.1 kg/(m^2).  GENERAL: vitals reviewed and listed above, alert, oriented, appears well hydrated and in no acute distress  HEENT: atraumatic, conjunttiva clear, no obvious abnormalities on inspection of external nose and ears  NECK: no obvious masses on inspection  LUNGS: clear to auscultation bilaterally, no wheezes, rales or rhonchi, good air movement  CV: HRRR, no peripheral edema  MS: moves all extremities without noticeable abnormality  PSYCH: pleasant and cooperative, mildly flat affect  ASSESSMENT AND PLAN:  Discussed the following assessment and plan:  Moderate episode of  recurrent major depressive disorder (HCC)  Neck pain  Poor posture  -increase effexor -CBT - she may do through work or apps for cost reasons -HEP and postural awareness for neck spasm -follow up 1 month -Patient advised to return or notify a doctor immediately if symptoms worsen or persist or new concerns arise.  Patient Instructions  BEFORE YOU LEAVE: -follow up: in 1 month -neck spasm exercises  Increase the Effexor to 187.5mg  (one 150mg  cap and one 37.5mg  cap) daily  Cognitive behavioral therapy - either through work or app.  Do the neck exercises 4 days per week and pay attention to posture.     Colin Benton R., DO

## 2016-04-14 NOTE — Progress Notes (Signed)
Pre visit review using our clinic review tool, if applicable. No additional management support is needed unless otherwise documented below in the visit note. 

## 2016-04-30 ENCOUNTER — Other Ambulatory Visit: Payer: Self-pay | Admitting: *Deleted

## 2016-04-30 MED ORDER — VENLAFAXINE HCL ER 150 MG PO CP24: ORAL_CAPSULE | ORAL | 2 refills | Status: DC

## 2016-05-05 ENCOUNTER — Other Ambulatory Visit: Payer: Self-pay | Admitting: *Deleted

## 2016-05-05 MED ORDER — VENLAFAXINE HCL ER 150 MG PO CP24: ORAL_CAPSULE | ORAL | 1 refills | Status: DC

## 2016-05-05 NOTE — Telephone Encounter (Signed)
Rx done. 

## 2016-05-22 ENCOUNTER — Ambulatory Visit: Payer: BLUE CROSS/BLUE SHIELD | Admitting: Family Medicine

## 2016-05-25 NOTE — Progress Notes (Signed)
HPI:  Depression/GAD: -started years ago -worsening depression for the last 6 months -increased effexor, lifestyle recs, CBT advised 03/2016 -reports: doing much better in terms of mood - more energy, more motivation - has worsened her chronic constipation a little (BM every 3-4 days, using probiotic, no blood) -denies: denies SI or any history of hospitalization, denies panic, thoughts of self harm, psychosis or mania  Chronic neck pain: -neck muscles, poor posture -does a lot of computer work -HEP advised 03/2016 - doing these and yoga, main symptoms when she wakes up, side sleeper -not much better No weakness, numbness, radiation Neg rheum eval remotely  ROS: See pertinent positives and negatives per HPI.  Past Medical History:  Diagnosis Date  . Anxiety and depression   . Cancer ([place]) 03/2014   basal carcinoma  . Gestational hypertension 11/2011  . Nephrolithiasis   . Seasonal allergies     Past Surgical History:  Procedure Laterality Date  . MOHS SURGERY  03/2014   BCC on nose.    Family History  Problem Relation Age of Onset  . Arthritis Mother   . Ankylosing spondylitis Mother   . Arthritis Father   . Other Father     car accident  . Arthritis Maternal Grandmother     Rheumatoid Arthritis  . Breast cancer Maternal Grandmother   . Diabetes Other   . Hypertension Other     grandparent  . Stroke Other     grandparent    Social History   Social History  . Marital status: Married    Spouse name: Cornelia Copa  . Number of children: 1  . Years of education: MA   Occupational History  . 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      Social History Main Topics  . Smoking status: Never Smoker  . Smokeless tobacco: Never Used     Comment: Married, lives with spouse. 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 Alcohol use Yes     Comment: 1 drink every two weeks  . Drug use: No  . Sexual activity: Yes   Other Topics  Concern  . 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  desloratadine (CLARINEX) 5 MG tablet, Take 5 mg by mouth daily., Disp: , Rfl:  .  Multiple Vitamin (MULTIVITAMIN WITH MINERALS) TABS tablet, Take 1 tablet by mouth daily., Disp: , Rfl:  .  venlafaxine XR (EFFEXOR XR) 37.5 MG 24 hr capsule, Take 1 capsule (37.5 mg total) by mouth daily with breakfast. Take one 37.5 and one 150mg  capsule daily to = 187.5mg  daily., Disp: 30 capsule, Rfl: 3 .  venlafaxine XR (EFFEXOR-XR) 150 MG 24 hr capsule, TAKE 1 CAPSULE (150 MG TOTAL) BY MOUTH DAILY WITH BREAKFAST., Disp: 90 capsule, Rfl: 1  EXAM:  Vitals:   05/26/16 1101  BP: 124/86  Pulse: 82  Temp: 98.7 F (37.1 C)    Body mass index is 27.2 kg/m.  GENERAL: vitals reviewed and listed above, alert, oriented, appears well hydrated and in no acute distress  HEENT: atraumatic, conjunttiva clear, no obvious abnormalities on inspection  of external nose and ears  NECK: no obvious masses on inspection, normal ROM, post cervical muscle TTP, neg spurling  LUNGS: clear to auscultation bilaterally, no wheezes, rales or rhonchi, good air movement  CV: HRRR, no peripheral edema  MS: moves all extremities without noticeable abnormality  PSYCH: pleasant and cooperative, no obvious depression or anxiety  ASSESSMENT AND PLAN:  Discussed the following assessment and plan:  Anxiety and depression -CBT -cont medication -f/u 3-4 months and sooner as needed  Constipation, unspecified constipation type -metamucil daily, mirilax prn -follow up 3-4 months  Neck pain - Plan: DG Cervical Spine Complete -xray -caution with ext/flex yoga poses -cont HEP -sleeping posture/pillow discussed -consider formal  PT  -Patient advised to return or notify a doctor immediately if symptoms worsen or persist or new concerns arise.  Patient Instructions  BEFORE YOU LEAVE: -follow up: 3-4 months -xray sheet  Get the neck xrays. Work on sleeping posture - particularly the pillow as we discussed. May consider physical therapy after xrays. Caution with yoga poses.  Fiber supplement every morning in liquid. Metamucil is a good option. Mirilax as needed for the constipation.  Cognitive behavioral therapy and continue effexor.    Colin Benton R., DO

## 2016-05-26 ENCOUNTER — Encounter: Payer: Self-pay | Admitting: Family Medicine

## 2016-05-26 ENCOUNTER — Ambulatory Visit (INDEPENDENT_AMBULATORY_CARE_PROVIDER_SITE_OTHER): Payer: BLUE CROSS/BLUE SHIELD | Admitting: Family Medicine

## 2016-05-26 VITALS — BP 124/86 | HR 82 | Temp 98.7°F | Ht 62.0 in | Wt 148.7 lb

## 2016-05-26 DIAGNOSIS — F418 Other specified anxiety disorders: Secondary | ICD-10-CM | POA: Diagnosis not present

## 2016-05-26 DIAGNOSIS — M542 Cervicalgia: Secondary | ICD-10-CM | POA: Diagnosis not present

## 2016-05-26 DIAGNOSIS — F419 Anxiety disorder, unspecified: Principal | ICD-10-CM

## 2016-05-26 DIAGNOSIS — F32A Depression, unspecified: Secondary | ICD-10-CM

## 2016-05-26 DIAGNOSIS — F329 Major depressive disorder, single episode, unspecified: Secondary | ICD-10-CM

## 2016-05-26 DIAGNOSIS — K5900 Constipation, unspecified: Secondary | ICD-10-CM | POA: Diagnosis not present

## 2016-05-26 NOTE — Patient Instructions (Addendum)
BEFORE YOU LEAVE: -follow up: 3-4 months -xray sheet  Get the neck xrays. Work on sleeping posture - particularly the pillow as we discussed. May consider physical therapy after xrays. Caution with yoga poses.  Fiber supplement every morning in liquid. Metamucil is a good option. Mirilax as needed for the constipation.  Cognitive behavioral therapy and continue effexor.

## 2016-05-26 NOTE — Progress Notes (Signed)
Pre visit review using our clinic review tool, if applicable. No additional management support is needed unless otherwise documented below in the visit note. 

## 2016-06-02 ENCOUNTER — Ambulatory Visit (INDEPENDENT_AMBULATORY_CARE_PROVIDER_SITE_OTHER)
Admission: RE | Admit: 2016-06-02 | Discharge: 2016-06-02 | Disposition: A | Discharge status: Home / Self Care | Payer: BLUE CROSS/BLUE SHIELD | Source: Ambulatory Visit | Attending: Family Medicine | Admitting: Family Medicine

## 2016-06-02 DIAGNOSIS — M542 Cervicalgia: Secondary | ICD-10-CM | POA: Diagnosis not present

## 2016-06-11 ENCOUNTER — Other Ambulatory Visit: Payer: Self-pay | Admitting: *Deleted

## 2016-06-11 MED ORDER — VENLAFAXINE HCL ER 37.5 MG PO CP24: 37.5000 mg | ORAL_CAPSULE | Freq: Every day | ORAL | 1 refills | Status: DC

## 2016-06-11 MED ORDER — VENLAFAXINE HCL ER 150 MG PO CP24: ORAL_CAPSULE | ORAL | 1 refills | Status: DC

## 2016-06-11 NOTE — Telephone Encounter (Signed)
Rx done. 

## 2016-06-19 DIAGNOSIS — D2272 Melanocytic nevi of left lower limb, including hip: Secondary | ICD-10-CM | POA: Diagnosis not present

## 2016-06-19 DIAGNOSIS — D2271 Melanocytic nevi of right lower limb, including hip: Secondary | ICD-10-CM | POA: Diagnosis not present

## 2016-06-19 DIAGNOSIS — Z85828 Personal history of other malignant neoplasm of skin: Secondary | ICD-10-CM | POA: Diagnosis not present

## 2016-06-19 DIAGNOSIS — D225 Melanocytic nevi of trunk: Secondary | ICD-10-CM | POA: Diagnosis not present

## 2016-07-14 DIAGNOSIS — Z6827 Body mass index (BMI) 27.0-27.9, adult: Secondary | ICD-10-CM | POA: Diagnosis not present

## 2016-07-14 DIAGNOSIS — Z01419 Encounter for gynecological examination (general) (routine) without abnormal findings: Secondary | ICD-10-CM | POA: Diagnosis not present

## 2016-09-08 ENCOUNTER — Ambulatory Visit (INDEPENDENT_AMBULATORY_CARE_PROVIDER_SITE_OTHER): Payer: BLUE CROSS/BLUE SHIELD | Admitting: Adult Health

## 2016-09-08 VITALS — BP 124/70 | Temp 98.3°F | Wt 149.3 lb

## 2016-09-08 DIAGNOSIS — J029 Acute pharyngitis, unspecified: Secondary | ICD-10-CM

## 2016-09-08 DIAGNOSIS — B9789 Other viral agents as the cause of diseases classified elsewhere: Secondary | ICD-10-CM

## 2016-09-08 DIAGNOSIS — J028 Acute pharyngitis due to other specified organisms: Principal | ICD-10-CM

## 2016-09-08 LAB — POCT RAPID STREP A (OFFICE): Rapid Strep A Screen: NEGATIVE

## 2016-09-08 MED ORDER — MAGIC MOUTHWASH W/LIDOCAINE
5.0000 mL | Freq: Three times a day (TID) | ORAL | 0 refills | Status: DC | PRN
Start: 2016-09-08 — End: 2016-11-19

## 2016-09-08 NOTE — Progress Notes (Signed)
Subjective:    Patient ID: Alison [name], female    DOB: 08-21-1981, 35 y.o.   MRN: [id number]  Sore Throat   This is a new problem. The current episode started in the past 7 days. The pain is worse on the left side. There has been no fever. Associated symptoms include swollen glands. Pertinent negatives include no congestion, coughing, hoarse voice, plugged ear sensation, neck pain or trouble swallowing. Associated symptoms comments: "feels like glass when I swallow" . She has had exposure to strep. She has tried nothing for the symptoms.      Review of Systems  Constitutional: Negative.   HENT: Positive for sore throat. Negative for congestion, hoarse voice, postnasal drip, sinus pain, sinus pressure and trouble swallowing.   Respiratory: Negative for cough.   Cardiovascular: Negative.   Gastrointestinal: Negative.   Musculoskeletal: Negative for neck pain.  Hematological: Positive for adenopathy.  All other systems reviewed and are negative.  Past Medical History:  Diagnosis Date  . Anxiety and depression   . Cancer (HCC) 03/2014   basal carcinoma  . Gestational hypertension 11/2011  . Nephrolithiasis   . Seasonal allergies     Social History   Social History  . Marital status: Married    Spouse name: Dennard Nip  . Number of children: 1  . Years of education: MA   Occupational History  . 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      Social History Main Topics  . Smoking status: Never Smoker  . Smokeless tobacco: Never Used     Comment: Married, lives with spouse. 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 Alcohol use Yes     Comment: 1 drink every two weeks  . Drug use: No  . Sexual activity: Yes   Other Topics Concern  . 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 MOHS SURGERY  03/2014   BCC on nose.    Family History  Problem Relation Age of Onset  . Arthritis Mother   . Ankylosing spondylitis Mother   . Arthritis Father   . Other Father     car accident  . Arthritis Maternal Grandmother     Rheumatoid Arthritis  . Breast cancer Maternal Grandmother   . Diabetes Other   . Hypertension Other     grandparent  . Stroke Other     grandparent    No Known Allergies  Current Outpatient Prescriptions on File Prior to Visit  Medication Sig Dispense Refill  . desloratadine (CLARINEX) 5 MG tablet Take 5 mg by mouth daily.    . Multiple Vitamin (MULTIVITAMIN WITH MINERALS) TABS tablet Take 1 tablet by mouth daily.    Marland Kitchen venlafaxine XR (EFFEXOR XR) 37.5 MG 24 hr capsule Take 1 capsule (37.5 mg total) by mouth daily with breakfast. Take one 37.5 and one 150mg  capsule daily to = 187.5mg  daily. 90 capsule 1  . venlafaxine XR (EFFEXOR-XR) 150 MG 24 hr capsule TAKE 1 CAPSULE (150 MG TOTAL) BY MOUTH DAILY WITH BREAKFAST. 90 capsule 1   No  current facility-administered medications on file prior to visit.     BP 124/70   Temp 98.3 F (36.8 C) (Oral)   Wt 149 lb 4.8 oz (67.7 kg)   BMI 27.31 kg/m       Objective:   Physical Exam  Constitutional: She is oriented to person, place, and time. She appears well-developed and well-nourished. No distress.  HENT:  Head: Normocephalic and atraumatic.  Right Ear: External ear normal.  Left Ear: External ear normal.  Nose: Nose normal.  Mouth/Throat: Uvula is midline, oropharynx is clear and moist and mucous membranes are normal. No oropharyngeal exudate, posterior oropharyngeal edema, posterior oropharyngeal erythema or tonsillar abscesses.  Cardiovascular: Normal rate, regular rhythm, normal heart sounds and intact distal pulses.  Exam reveals no gallop and no friction rub.   No murmur  heard. Pulmonary/Chest: Effort normal and breath sounds normal. No respiratory distress. She has no wheezes. She has no rales. She exhibits no tenderness.  Lymphadenopathy:       Head (right side): No tonsillar adenopathy present.       Head (left side): Tonsillar adenopathy present.  Neurological: She is alert and oriented to person, place, and time.  Skin: Skin is warm and dry. No rash noted. She is not diaphoretic. No erythema. No pallor.  Psychiatric: She has a normal mood and affect. Her behavior is normal. Judgment and thought content normal.  Nursing note and vitals reviewed.     Assessment & Plan:  1. Sore throat (viral) - Likely viral, no signs of strep. Will do rapid due to exposure.  - Centor score of 2  - magic mouthwash w/lidocaine SOLN; Take 5 mLs by mouth 3 (three) times daily as needed.  Dispense: 180 mL; Refill: 0 - POC Rapid Strep A- Negative  - Conservative measures - Follow up if no improvement   Shirline Frees, NP

## 2016-09-08 NOTE — Patient Instructions (Addendum)
Your rapid strep came back negative.   Your sore throat is likely from a virus. I have prescribed magic mouth wash - gargle for 15 seconds and spit   Sore Throat A sore throat is pain, burning, irritation, or scratchiness in the throat. When you have a sore throat, you may feel pain or tenderness in your throat when you swallow or talk. Many things can cause a sore throat, including:  An infection.  Seasonal allergies.  Dryness in the air.  Irritants, such as smoke or pollution.  Gastroesophageal reflux disease (GERD).  A tumor. A sore throat is often the first sign of another sickness. It may happen with other symptoms, such as coughing, sneezing, fever, and swollen neck glands. Most sore throats go away without medical treatment. Follow these instructions at home:  Take over-the-counter medicines only as told by your health care provider.  Drink enough fluids to keep your urine clear or pale yellow.  Rest as needed.  To help with pain, try:  Sipping warm liquids, such as broth, herbal tea, or warm water.  Eating or drinking cold or frozen liquids, such as frozen ice pops.  Gargling with a salt-water mixture 3-4 times a day or as needed. To make a salt-water mixture, completely dissolve -1 tsp of salt in 1 cup of warm water.  Sucking on hard candy or throat lozenges.  Putting a cool-mist humidifier in your bedroom at night to moisten the air.  Sitting in the bathroom with the door closed for 5-10 minutes while you run hot water in the shower.  Do not use any tobacco products, such as cigarettes, chewing tobacco, and e-cigarettes. If you need help quitting, ask your health care provider. Contact a health care provider if:  You have a fever for more than 2-3 days.  You have symptoms that last (are persistent) for more than 2-3 days.  Your throat does not get better within 7 days.  You have a fever and your symptoms suddenly get worse. Get help right away  if:  You have difficulty breathing.  You cannot swallow fluids, soft foods, or your saliva.  You have increased swelling in your throat or neck.  You have persistent nausea and vomiting. This information is not intended to replace advice given to you by your health care provider. Make sure you discuss any questions you have with your health care provider. Document Released: 10/22/2004 Document Revised: 05/10/2016 Document Reviewed: 07/05/2015 Elsevier Interactive Patient Education  2017 [employer].

## 2016-10-01 ENCOUNTER — Ambulatory Visit (INDEPENDENT_AMBULATORY_CARE_PROVIDER_SITE_OTHER): Payer: Self-pay

## 2016-10-01 ENCOUNTER — Ambulatory Visit (INDEPENDENT_AMBULATORY_CARE_PROVIDER_SITE_OTHER): Payer: BLUE CROSS/BLUE SHIELD | Admitting: Family

## 2016-10-01 ENCOUNTER — Encounter (INDEPENDENT_AMBULATORY_CARE_PROVIDER_SITE_OTHER): Payer: Self-pay | Admitting: Orthopedic Surgery

## 2016-10-01 DIAGNOSIS — M545 Low back pain, unspecified: Secondary | ICD-10-CM

## 2016-10-01 DIAGNOSIS — M533 Sacrococcygeal disorders, not elsewhere classified: Secondary | ICD-10-CM | POA: Diagnosis not present

## 2016-10-01 DIAGNOSIS — M5442 Lumbago with sciatica, left side: Secondary | ICD-10-CM | POA: Diagnosis not present

## 2016-10-01 MED ORDER — PREDNISONE 10 MG PO TABS: 5.0000 mg | ORAL_TABLET | Freq: Every day | ORAL | 0 refills | Status: DC

## 2016-10-01 NOTE — Progress Notes (Signed)
Office Visit Note   Patient: Alison [name]           Date of Birth: 13-May-1981           MRN: [id number] Visit Date: 10/01/2016              Requested by: Terressa Koyanagi, DO [address] [place], [place] [address] PCP: Terressa Koyanagi., DO  Chief Complaint  Patient presents with  . Right Hip - Pain    Posterior hip/buttock pain    HPI:  Patient presents today with pain right SI joint since this past Monday. No known injury, does have a history of two prior occurrences in the past several years. Last time occurred late in pregnancy and resolved with delivery of her child.   Pain worse with sitting and laying down. She denies any radicular pain in her leg. She complains of posterior hip and buttock pain. Well localized stabbing and aching pain, constant. She has been taking 400mg -500mg  of ibuprofen daily.     Assessment & Plan: Visit Diagnoses:  1. Acute right-sided low back pain with left-sided sciatica   2. SI (sacroiliac) pain   3. Acute right-sided low back pain without sciatica     Plan: Have provided a prescription for Prednisone which she will take for the next 2 weeks. May use Ibuprofen as well 600 mb q6 h prn. She will call or return if continued issues in 4 weeks. We can get her in for [facility] if no improvement.   Follow-Up Instructions: Return in about 3 weeks (around 10/22/2016), or if symptoms worsen or fail to improve.   Patient is alert and oriented. No adenopathy. Well-dressed. Normal affect. Respirations easy. Steady gait.   Back Exam   Tenderness  The patient is experiencing tenderness in the sacroiliac.  Range of Motion  The patient has normal back ROM.  Muscle Strength  The patient has normal back strength.  Tests  Straight leg raise right: negative Straight leg raise left: negative  Other  Gait: normal         Imaging: No results found.  Orders:  Orders Placed This Encounter  Procedures  . XR Lumbar Spine 2-3 Views   Meds ordered  this encounter  Medications  . predniSONE (DELTASONE) 10 MG tablet    Sig: Take 0.5 tablets (5 mg total) by mouth daily with breakfast. Take 10 mg po once daily for 1 week. Then 5 mg po qd for 1 week    Dispense:  21 tablet    Refill:  0     Procedures: No procedures performed  Clinical Data: No additional findings.  Subjective: Review of Systems  Constitutional: Negative for chills and fever.  Musculoskeletal: Positive for back pain. Negative for gait problem.    Objective: Vital Signs: There were no vitals taken for this visit.  Specialty Comments:  No specialty comments available.  PMFS History: Patient Active Problem List   Diagnosis Date Noted  . Seasonal allergies 12/28/2014  . Anxiety and depression 12/28/2014   Past Medical History:  Diagnosis Date  . Anxiety and depression   . Cancer (HCC) 03/2014   basal carcinoma  . Gestational hypertension 11/2011  . Nephrolithiasis   . Seasonal allergies     Family History  Problem Relation Age of Onset  . Arthritis Mother   . Ankylosing spondylitis Mother   . Arthritis Father   . Other Father     car accident  . Arthritis Maternal Grandmother  Rheumatoid Arthritis  . Breast cancer Maternal Grandmother   . Diabetes Other   . Hypertension Other     grandparent  . Stroke Other     grandparent    Past Surgical History:  Procedure Laterality Date  . MOHS SURGERY  03/2014   BCC on nose.   Social History   Occupational History  . 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      Social History Main Topics  . Smoking status: Never Smoker  . Smokeless tobacco: Never Used     Comment: Married, lives with spouse. 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 Alcohol use Yes     Comment: 1 drink every two weeks  . Drug use: No  . Sexual activity: Yes

## 2016-10-15 ENCOUNTER — Encounter (INDEPENDENT_AMBULATORY_CARE_PROVIDER_SITE_OTHER): Payer: Self-pay | Admitting: Family

## 2016-10-16 ENCOUNTER — Other Ambulatory Visit (INDEPENDENT_AMBULATORY_CARE_PROVIDER_SITE_OTHER): Payer: Self-pay | Admitting: Family

## 2016-10-16 DIAGNOSIS — G8929 Other chronic pain: Secondary | ICD-10-CM

## 2016-10-16 DIAGNOSIS — M533 Sacrococcygeal disorders, not elsewhere classified: Principal | ICD-10-CM

## 2016-10-20 ENCOUNTER — Ambulatory Visit (INDEPENDENT_AMBULATORY_CARE_PROVIDER_SITE_OTHER): Payer: BLUE CROSS/BLUE SHIELD | Admitting: Physical Medicine and Rehabilitation

## 2016-10-20 ENCOUNTER — Encounter (INDEPENDENT_AMBULATORY_CARE_PROVIDER_SITE_OTHER): Payer: Self-pay | Admitting: Physical Medicine and Rehabilitation

## 2016-10-20 VITALS — BP 158/111 | HR 87

## 2016-10-20 DIAGNOSIS — M461 Sacroiliitis, not elsewhere classified: Secondary | ICD-10-CM

## 2016-10-20 NOTE — Progress Notes (Signed)
Alison [name] - 36 y.o. female MRN [id number]  Date of birth: 03-09-1981  Office Visit Note: Visit Date: 10/20/2016 PCP: Alison [name]., DO Referred by: Alison Koyanagi, DO  Subjective: Chief Complaint  Patient presents with  . Lower Back - Pain   HPI: Alison [name] is a 36 year old female followed by Alison [name]. She reports worsening severe right buttock pain. Radiates down right leg to knee with driving. This referral pattern is just in the hamstring.Worse with sitting and laying. Better with walking. Episodes of this pain since 2013. Persistent pain since last Thursday. She had an episode of sacroiliac joint pain with the birth of her children.she reports no numbness tingling or paresthesia. She has felt conservative care otherwise.    ROS Otherwise per HPI.  Assessment & Plan: Visit Diagnoses:  1. Sacroiliitis (HCC)     Plan: Findings:  Diagnostic and therapeutic right sacroiliac joint injection under fluoroscopic guidance. We did take the liberty of writing a prescription for physical therapy for manual treatment the sacroiliac joint with gentle manipulation.    Meds & Orders: No orders of the defined types were placed in this encounter.   Orders Placed This Encounter  Procedures  . Large Joint Injection/Arthrocentesis  . Ambulatory referral to Physical Therapy    Follow-up: Return if symptoms worsen or fail to improve, 2weeks, for for follow up with Alison [name].   Procedures: Sacroiliac joint injection fluoroscopic guidance. Date/Time: 10/20/2016 3:10 PM Performed by: Alison [name] Authorized by: Alison [name]   Consent Given by:  Patient Site marked: the procedure site was marked   Timeout: prior to procedure the correct patient, procedure, and site was verified   Indications:  Pain and diagnostic evaluation Location:  Sacroiliac Site:  R sacroiliac joint Prep: patient was prepped and draped in usual sterile fashion   Needle Size:  22 G Needle Length:  3.5  inches Approach:  Posterior Ultrasound Guidance: No   Fluoroscopic Guidance: Yes   Arthrogram: No   Medications:  2 mL bupivacaine 0.5 %; 80 mg methylPREDNISolone acetate 80 MG/ML Aspiration Attempted: No   Patient tolerance:  Patient tolerated the procedure well with no immediate complications  There was excellent flow of contrast producing a partial arthrogram of the sacroiliac joint.      No notes on file   Clinical History: No specialty comments available.  She reports that she has never smoked. She has never used smokeless tobacco. No results for input(s): HGBA1C, LABURIC in the last 8760 hours.  Objective:  VS:  HT:    WT:   BMI:     BP:(!) 158/111  HR:87bpm  TEMP: ( )  RESP:  Physical Exam  Musculoskeletal:  She ambulates without aid with a normal gait. She has good distal strength. She has a positiveortin finger test on the right.    Ortho Exam Imaging: No results found.  Past Medical/Family/Surgical/Social History: Medications & Allergies reviewed per EMR Patient Active Problem List   Diagnosis Date Noted  . Seasonal allergies 12/28/2014  . Anxiety and depression 12/28/2014   Past Medical History:  Diagnosis Date  . Anxiety and depression   . Cancer (HCC) 03/2014   basal carcinoma  . Gestational hypertension 11/2011  . Nephrolithiasis   . Seasonal allergies    Family History  Problem Relation Age of Onset  . Arthritis Mother   . Ankylosing spondylitis Mother   . Arthritis Father   . Other Father     car accident  . Arthritis Maternal  Grandmother     Rheumatoid Arthritis  . Breast cancer Maternal Grandmother   . Diabetes Other   . Hypertension Other     grandparent  . Stroke Other     grandparent   Past Surgical History:  Procedure Laterality Date  . MOHS SURGERY  03/2014   BCC on nose.   Social History   Occupational History  . COUNSELOR     prn  .      Social History Main Topics  . Smoking status: Never Smoker  .  Smokeless tobacco: Never Used     Comment: Married, lives with spouse. 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 Alcohol use Yes     Comment: 1 drink every two weeks  . Drug use: No  . Sexual activity: Yes

## 2016-10-20 NOTE — Patient Instructions (Signed)

## 2016-10-21 MED ORDER — BUPIVACAINE HCL 0.5 % IJ SOLN
2.0000 mL | INTRAMUSCULAR | Status: AC | PRN
  Administered 2016-10-20: 2 mL via INTRA_ARTICULAR

## 2016-10-21 MED ORDER — METHYLPREDNISOLONE ACETATE 80 MG/ML IJ SUSP
80.0000 mg | INTRAMUSCULAR | Status: AC | PRN
  Administered 2016-10-20: 80 mg via INTRA_ARTICULAR

## 2016-11-19 ENCOUNTER — Ambulatory Visit (INDEPENDENT_AMBULATORY_CARE_PROVIDER_SITE_OTHER): Payer: BLUE CROSS/BLUE SHIELD | Admitting: Family Medicine

## 2016-11-19 ENCOUNTER — Encounter: Payer: Self-pay | Admitting: Family Medicine

## 2016-11-19 VITALS — BP 146/96 | HR 89 | Temp 98.9°F | Ht 62.0 in | Wt 150.4 lb

## 2016-11-19 DIAGNOSIS — M2550 Pain in unspecified joint: Secondary | ICD-10-CM | POA: Diagnosis not present

## 2016-11-19 DIAGNOSIS — F418 Other specified anxiety disorders: Secondary | ICD-10-CM | POA: Diagnosis not present

## 2016-11-19 DIAGNOSIS — F419 Anxiety disorder, unspecified: Secondary | ICD-10-CM

## 2016-11-19 DIAGNOSIS — I10 Essential (primary) hypertension: Secondary | ICD-10-CM

## 2016-11-19 DIAGNOSIS — F32A Depression, unspecified: Secondary | ICD-10-CM

## 2016-11-19 DIAGNOSIS — F329 Major depressive disorder, single episode, unspecified: Secondary | ICD-10-CM

## 2016-11-19 MED ORDER — HYDROCHLOROTHIAZIDE 25 MG PO TABS: 25.0000 mg | ORAL_TABLET | Freq: Every day | ORAL | 3 refills | Status: DC

## 2016-11-19 NOTE — Progress Notes (Signed)
Pre visit review using our clinic review tool, if applicable. No additional management support is needed unless otherwise documented below in the visit note. 

## 2016-11-19 NOTE — Patient Instructions (Addendum)
BEFORE YOU LEAVE: -follow up: 1 month -labs  Start the Hydrochlorothiazide and take once daily  We recommend the following healthy lifestyle for LIFE: 1) Small portions.   Tip: eat off of a salad plate instead of a dinner plate.  Tip: if you need more or a snack choose fruits, veggies and/or a handful of nuts or seeds.  2) Eat a healthy clean diet.  * Tip: Avoid (less then 1 serving per week): processed foods, sweets, sweetened drinks, white starches (rice, flour, bread, potatoes, pasta, etc), red meat, fast foods, butter  *Tip: CHOOSE instead   * 5-9 servings per day of fresh or frozen fruits and vegetables (but not corn, potatoes, bananas, canned or dried fruit)   *nuts and seeds, beans   * olive oil    *small portions of lean meats such as fish and white chicken    *small portions of whole grains  3)Get at least 150 minutes of sweaty aerobic exercise per week.  4)Reduce stress - consider counseling, meditation and relaxation to balance other aspects of your life.

## 2016-11-19 NOTE — Progress Notes (Signed)
HPI:  Acute visit for:  Elevated BP: -reports hx intermittently elevated BP on hctz in the past -resolved with lifestyle changes and stopping OCPs -tolerated hctz well in the past -reports BP has been up with nurse visit checks for about 1 month -no CP, SOB, DOE, sig headaches, swelling -FDLMP 1 week ago, not trying to conceive -reports eating healthy and getting regular exercise  Follow up on Anxiety and Depression: -stable, reports doing well -continues effexor    ROS: See pertinent positives and negatives per HPI.  Past Medical History:  Diagnosis Date  . Anxiety and depression   . Cancer ([place]) 03/2014   basal carcinoma  . Gestational hypertension 11/2011  . Nephrolithiasis   . Seasonal allergies     Past Surgical History:  Procedure Laterality Date  . MOHS SURGERY  03/2014   BCC on nose.    Family History  Problem Relation Age of Onset  . Arthritis Mother   . Ankylosing spondylitis Mother   . Arthritis Father   . Other Father     car accident  . Arthritis Maternal Grandmother     Rheumatoid Arthritis  . Breast cancer Maternal Grandmother   . Diabetes Other   . Hypertension Other     grandparent  . Stroke Other     grandparent    Social History   Social History  . Marital status: Married    Spouse name: Cornelia Copa  . Number of children: 1  . Years of education: MA   Occupational History  . 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      Social History Main Topics  . Smoking status: Never Smoker  . Smokeless tobacco: Never Used     Comment: Married, lives with spouse. 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 Alcohol use Yes     Comment: 1 drink every two weeks  . Drug use: No  . Sexual activity: Yes   Other Topics Concern  . 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  desloratadine (CLARINEX) 5 MG tablet, Take 5 mg by mouth daily., Disp: , Rfl:  .  Multiple Vitamin (MULTIVITAMIN WITH MINERALS) TABS tablet, Take 1 tablet by mouth daily., Disp: , Rfl:  .  venlafaxine XR (EFFEXOR XR) 37.5 MG 24 hr capsule, Take 1 capsule (37.5 mg total) by mouth daily with breakfast. Take one 37.5 and one 150mg  capsule daily to = 187.5mg  daily., Disp: 90 capsule, Rfl: 1 .  venlafaxine XR (EFFEXOR-XR) 150 MG 24 hr capsule, TAKE 1 CAPSULE (150 MG TOTAL) BY MOUTH DAILY WITH BREAKFAST., Disp: 90 capsule, Rfl: 1 .  hydrochlorothiazide (HYDRODIURIL) 25 MG tablet, Take 1 tablet (25 mg total) by mouth daily., Disp: 90 tablet, Rfl: 3  EXAM:  Vitals:   11/19/16 1626  BP: (!) 146/96  Pulse: 89  Temp: 98.9 F (37.2 C)    Body mass index is 27.51 kg/m.  GENERAL: vitals reviewed and listed above, alert, oriented, appears well hydrated and in no acute distress  HEENT: atraumatic, conjunttiva clear, no obvious abnormalities on inspection of external nose and  ears  NECK: no obvious masses on inspection  LUNGS: clear to auscultation bilaterally, no wheezes, rales or rhonchi, good air movement  CV: HRRR, no peripheral edema  MS: moves all extremities without noticeable abnormality  PSYCH: pleasant and cooperative, no obvious depression or anxiety  ASSESSMENT AND PLAN:  Discussed the following assessment and plan:  Essential hypertension - Plan: CBC, Basic metabolic panel -discussed tx options, risks, benefits -opted to start hctz -labs -follow up 1 month -lifestyle recs  Anxiety and depression -stable  -Patient advised to return or notify a doctor immediately if symptoms worsen or persist or new concerns arise.  Patient Instructions  BEFORE YOU LEAVE: -follow up: 1 month -labs  Start the Hydrochlorothiazide and take once daily  We recommend the  following healthy lifestyle for LIFE: 1) Small portions.   Tip: eat off of a salad plate instead of a dinner plate.  Tip: if you need more or a snack choose fruits, veggies and/or a handful of nuts or seeds.  2) Eat a healthy clean diet.  * Tip: Avoid (less then 1 serving per week): processed foods, sweets, sweetened drinks, white starches (rice, flour, bread, potatoes, pasta, etc), red meat, fast foods, butter  *Tip: CHOOSE instead   * 5-9 servings per day of fresh or frozen fruits and vegetables (but not corn, potatoes, bananas, canned or dried fruit)   *nuts and seeds, beans   * olive oil    *small portions of lean meats such as fish and white chicken    *small portions of whole grains  3)Get at least 150 minutes of sweaty aerobic exercise per week.  4)Reduce stress - consider counseling, meditation and relaxation to balance other aspects of your life.     Colin Benton R., DO

## 2016-11-20 LAB — CBC
HCT: 41 % (ref 36.0–46.0)
Hemoglobin: 14 g/dL (ref 12.0–15.0)
MCHC: 34.2 g/dL (ref 30.0–36.0)
MCV: 90.8 fl (ref 78.0–100.0)
Platelets: 220 10*3/uL (ref 150.0–400.0)
RBC: 4.52 Mil/uL (ref 3.87–5.11)
RDW: 12.5 % (ref 11.5–15.5)
WBC: 7.8 10*3/uL (ref 4.0–10.5)

## 2016-11-20 LAB — BASIC METABOLIC PANEL
BUN: 16 mg/dL (ref 6–23)
CO2: 29 mEq/L (ref 19–32)
Calcium: 9.2 mg/dL (ref 8.4–10.5)
Chloride: 102 mEq/L (ref 96–112)
Creatinine, Ser: 0.91 mg/dL (ref 0.40–1.20)
GFR: 74.19 mL/min (ref >60.00)
Glucose, Bld: 91 mg/dL (ref 70–99)
Potassium: 3.7 mEq/L (ref 3.5–5.1)
Sodium: 138 mEq/L (ref 135–145)

## 2016-12-03 ENCOUNTER — Other Ambulatory Visit: Payer: Self-pay | Admitting: Family Medicine

## 2016-12-04 ENCOUNTER — Ambulatory Visit (INDEPENDENT_AMBULATORY_CARE_PROVIDER_SITE_OTHER): Payer: BLUE CROSS/BLUE SHIELD | Admitting: Family Medicine

## 2016-12-04 ENCOUNTER — Telehealth: Payer: Self-pay | Admitting: Family Medicine

## 2016-12-04 ENCOUNTER — Ambulatory Visit (INDEPENDENT_AMBULATORY_CARE_PROVIDER_SITE_OTHER)
Admission: RE | Admit: 2016-12-04 | Discharge: 2016-12-04 | Disposition: A | Discharge status: Home / Self Care | Payer: BLUE CROSS/BLUE SHIELD | Source: Ambulatory Visit | Attending: Family Medicine | Admitting: Family Medicine

## 2016-12-04 ENCOUNTER — Encounter: Payer: Self-pay | Admitting: Family Medicine

## 2016-12-04 VITALS — BP 132/96 | HR 93 | Temp 98.3°F | Ht 62.0 in | Wt 150.3 lb

## 2016-12-04 DIAGNOSIS — R079 Chest pain, unspecified: Secondary | ICD-10-CM

## 2016-12-04 DIAGNOSIS — I10 Essential (primary) hypertension: Secondary | ICD-10-CM

## 2016-12-04 DIAGNOSIS — J302 Other seasonal allergic rhinitis: Secondary | ICD-10-CM

## 2016-12-04 DIAGNOSIS — F419 Anxiety disorder, unspecified: Secondary | ICD-10-CM | POA: Diagnosis not present

## 2016-12-04 MED ORDER — LOSARTAN POTASSIUM 50 MG PO TABS: 50.0000 mg | ORAL_TABLET | Freq: Every day | ORAL | 3 refills | Status: DC

## 2016-12-04 NOTE — Progress Notes (Signed)
HPI:  Follow up HTN: -started hctz 25 2/22 -BP still running high at home - 130s/90s -denies: SOB, HA, palpitations, swelling -reports has increased exercise and notices some exertional CP at times - it is a tightness in the L side of her chest that can last for 30 minutes and improves with rest - she is actually not sure how long this has been going on -she thinks it could be anxiety, though reports feels different then anxiety in the past -no symptoms today -no FH heart disease -she is very worried about her heart -no cough, fevers, malaise, wheezing, hx asthma -does have hx allergies  ROS: See pertinent positives and negatives per HPI.  Past Medical History:  Diagnosis Date  . Anxiety and depression   . Cancer ([place]) 03/2014   basal carcinoma  . Gestational hypertension 11/2011  . Nephrolithiasis   . Seasonal allergies     Past Surgical History:  Procedure Laterality Date  . MOHS SURGERY  03/2014   BCC on nose.    Family History  Problem Relation Age of Onset  . Arthritis Mother   . Ankylosing spondylitis Mother   . Arthritis Father   . Other Father     car accident  . Arthritis Maternal Grandmother     Rheumatoid Arthritis  . Breast cancer Maternal Grandmother   . Diabetes Other   . Hypertension Other     grandparent  . Stroke Other     grandparent    Social History   Social History  . Marital status: Married    Spouse name: Cornelia Copa  . Number of children: 1  . Years of education: MA   Occupational History  . 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      Social History Main Topics  . Smoking status: Never Smoker  . Smokeless tobacco: Never Used     Comment: Married, lives with spouse. 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 Alcohol use Yes     Comment: 1 drink every two weeks  . Drug use: No  . Sexual activity: Yes   Other Topics Concern  . 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  desloratadine (CLARINEX) 5 MG tablet, Take 5 mg by mouth daily., Disp: , Rfl:  .  hydrochlorothiazide (HYDRODIURIL) 25 MG tablet, Take 1 tablet (25 mg total) by mouth daily., Disp: 90 tablet, Rfl: 3 .  Multiple Vitamin (MULTIVITAMIN WITH MINERALS) TABS tablet, Take 1 tablet by mouth daily., Disp: , Rfl:  .  venlafaxine XR (EFFEXOR-XR) 150 MG 24 hr capsule, TAKE 1 CAPSULE (150 MG TOTAL) BY MOUTH DAILY WITH BREAKFAST., Disp: 90 capsule, Rfl: 0 .  venlafaxine XR (EFFEXOR-XR) 37.5 MG 24 hr capsule, TAKE ONE CAPSULE BY MOUTH EVERY DAY WITH BREAKFAST, Disp: 90 capsule, Rfl: 0 .  losartan (COZAAR) 50 MG tablet, Take 1 tablet (50 mg total) by mouth daily., Disp: 90 tablet, Rfl: 3  EXAM:  Vitals:   12/04/16 1528  BP: (!) 132/96  Pulse: 93  Temp: 98.3 F (36.8 C)    Body mass index is  27.49 kg/m.  GENERAL: vitals reviewed and listed above, alert, oriented, appears well hydrated and in no acute distress  HEENT: atraumatic, conjunttiva clear, no obvious abnormalities on inspection of external nose and ears  NECK: no obvious masses on inspection  LUNGS: clear to auscultation bilaterally, no wheezes, rales or rhonchi, good air movement  CV: HRRR, no peripheral edema  MS: moves all extremities without noticeable abnormality  PSYCH: pleasant and cooperative, no obvious depression or anxiety  ASSESSMENT AND PLAN:  Discussed the following assessment and plan: More than 50% of over 40 minutes spent in total in caring for this patient was spent face-to-face with the patient, counseling and/or coordinating care.    Essential hypertension - Plan: Ambulatory referral to Cardiology  Chest pain, unspecified type - Plan: EKG 12-Lead, EKG 12-Lead, EKG 12-Lead, EKG  12-Lead, DG Chest 2 View, Ambulatory referral to Cardiology  Seasonal allergic rhinitis, unspecified chronicity, unspecified trigger  Anxiety  -add losartan and baby asa -EKG with NSR, poor r wave progression - chest pain could deconditioning, BP related, anxiety vs mild bronchitis; but will have her see cardiologist also per her preference after discussion as week -CXR as well and may need PFT -follow up 1 month -Patient advised to return or notify a doctor immediately if symptoms worsen or persist or new concerns arise.  Patient Instructions  BEFORE YOU LEAVE: -EKG -xray sheet -follow up: 1 month  Continue the Hyrdochlorthiazide.  Start Losartan 50mg  daily.  We placed a referral for you as discussed to cardiologiy. It usually takes about 1-2 weeks to process and schedule this referral. If you have not heard from [place] regarding this appointment in 2 weeks please contact our office.  Go get the chest xray  I hope you are feeling better soon! Seek care immediately if worsening, new concerns or you are not improving with treatment.           Colin Benton R., DO

## 2016-12-04 NOTE — Patient Instructions (Addendum)
BEFORE YOU LEAVE: -EKG -xray sheet -follow up: 1 month  Continue the Hyrdochlorthiazide.  Start Losartan 50mg  daily.  We placed a referral for you as discussed to cardiologiy. It usually takes about 1-2 weeks to process and schedule this referral. If you have not heard from [place] regarding this appointment in 2 weeks please contact our office.  Go get the chest xray  I hope you are feeling better soon! Seek care immediately if worsening, new concerns or you are not improving with treatment.

## 2016-12-04 NOTE — Telephone Encounter (Signed)
See if can come for appt today to check here. Thanks.

## 2016-12-04 NOTE — Telephone Encounter (Signed)
Pt state that her Bp is still elevated 140/90-100 pt would like to know what you would like for her to do at this point.

## 2016-12-04 NOTE — Telephone Encounter (Signed)
Appt scheduled for today 3:30pm.

## 2016-12-04 NOTE — Progress Notes (Signed)
Pre visit review using our clinic review tool, if applicable. No additional management support is needed unless otherwise documented below in the visit note. 

## 2016-12-06 ENCOUNTER — Encounter: Payer: Self-pay | Admitting: Family Medicine

## 2016-12-09 ENCOUNTER — Encounter: Payer: Self-pay | Admitting: Cardiology

## 2016-12-17 NOTE — Progress Notes (Signed)
Cardiology Office Note   Date:  12/20/2016   ID:  Alison [name], DOB 09/20/1981, MRN [id number]  PCP:  Terressa Koyanagi., DO  Cardiologist:   Rollene Rotunda, MD  Referring:  Terressa Koyanagi., DO  Chief Complaint  Patient presents with  . Dizziness      History of Present Illness: Alison [name] is a 36 y.o. female who presents for evaluation of chest pain and HTN.  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 She has a 53-year-old child. Her blood pressure has been going up. This is despite having a healthy lifestyle.  She exercises routinely. She watches her salt. She's actually been losing weight. However, her diastolic blood pressures have been elevated and she was first put on hydrochlorothiazide. She later had Cozaar added. Her blood pressures have come down. She did have treatment with steroids for some back problems this was in January and her blood pressure elevation long after this. She's not had any presyncope or syncope. She's had no flushing or diaphoresis. She has had a vague chest discomfort. It is across her chest. It was mild 2 out of 10. It would last for about 5 minutes at a time. She might notice it with exercise. There was no radiation to her jaw or to her arms. She was not getting nausea vomiting . She did notice that her discomfort improved as her blood pressure came down.    Past Medical History:  Diagnosis Date  . Anxiety and depression   . Cancer (HCC) 03/2014   basal carcinoma  . Gestational hypertension 11/2011  . Nephrolithiasis   . Seasonal allergies     Past Surgical History:  Procedure Laterality Date  . MOHS SURGERY  03/2014   BCC on nose.     Current Outpatient Prescriptions  Medication Sig Dispense Refill  . desloratadine (CLARINEX) 5 MG tablet Take 5 mg by mouth daily.    Marland Kitchen losartan (COZAAR) 50 MG tablet Take 1 tablet (50 mg total) by mouth daily. 90 tablet 3  . Multiple Vitamin (MULTIVITAMIN WITH  MINERALS) TABS tablet Take 1 tablet by mouth daily.    Marland Kitchen venlafaxine XR (EFFEXOR-XR) 150 MG 24 hr capsule TAKE 1 CAPSULE (150 MG TOTAL) BY MOUTH DAILY WITH BREAKFAST. 90 capsule 0  . chlorthalidone (HYGROTON) 25 MG tablet Take 1 tablet (25 mg total) by mouth daily. 30 tablet 9   No current facility-administered medications for this visit.     Allergies:   Patient has no known allergies.    Social History:  The patient  reports that she has never smoked. She has never used smokeless tobacco. She reports that she drinks alcohol. She reports that she does not use drugs.   Family History:  The patient's family history includes Ankylosing spondylitis in her mother; Arthritis in her father, maternal grandmother, and mother; Breast cancer in her maternal grandmother; Diabetes in her other; Hypertension in her other; Other in her father; Stroke in her other.    ROS:  Please see the history of present illness.   Otherwise, review of systems are positive for none.   All other systems are reviewed and negative.    PHYSICAL EXAM: VS:  BP 135/85   Pulse 87   Ht 5\' 2"  (1.575 m)   Wt 151 lb 12.8 oz (68.9 kg)   BMI 27.76 kg/m  , BMI Body mass index is 27.76 kg/m. GENERAL:  Well appearing HEENT:  Pupils equal round and reactive, fundi not visualized, oral mucosa unremarkable NECK:  No jugular venous distention, waveform within normal limits, carotid upstroke brisk and symmetric, no bruits, no thyromegaly LYMPHATICS:  No cervical, inguinal adenopathy LUNGS:  Clear to auscultation bilaterally BACK:  No CVA tenderness CHEST:  Unremarkable HEART:  PMI not displaced or sustained,S1 and S2 within normal limits, no S3, no S4, no clicks, no rubs, no murmurs ABD:  Flat, positive bowel sounds normal in frequency in pitch, no bruits, no rebound, no guarding, no midline pulsatile mass, no hepatomegaly, no splenomegaly EXT:  2 plus pulses throughout, no edema, no cyanosis no clubbing SKIN:  No rashes no  nodules NEURO:  Cranial nerves II through XII grossly intact, motor grossly intact throughout PSYCH:  Cognitively intact, oriented to person place and time    EKG:  EKG is not ordered today. The ekg ordered 12/04/16 sinus rhythm, rate 86, axis within normal limits, intervals within normal limits, no acute ST-T wave changes.  Lab Results  Component Value Date   TSH 1.35 03/13/2014    Recent Labs: 11/19/2016: BUN 16; Creatinine, Ser 0.91; Hemoglobin 14.0; Platelets 220.0; Potassium 3.7; Sodium 138    Lipid Panel    Component Value Date/Time   CHOL 205 (H) 08/23/2013 0928   TRIG 97.0 08/23/2013 0928   HDL 59.50 08/23/2013 0928   CHOLHDL 3 08/23/2013 0928   VLDL 19.4 08/23/2013 0928   LDLCALC 71 01/31/2010 0849   LDLDIRECT 129.2 08/23/2013 0928      Wt Readings from Last 3 Encounters:  12/18/16 151 lb 12.8 oz (68.9 kg)  12/04/16 150 lb 4.8 oz (68.2 kg)  11/19/16 150 lb 6.4 oz (68.2 kg)      Other studies Reviewed: Additional studies/ records that were reviewed today include: Labs. Review of the above records demonstrates:  Please see elsewhere in the note.     ASSESSMENT AND PLAN:   CHEST PAIN:   Her chest pain is very atypical. I think the pretest probability of obstructive coronary disease is very low. I don't think that further cardiovascular testing is indicated.  HTN: I suspect essential hypertension. Her labs were recently normal. I will check a TSH. I'll check renal artery Dopplers on the low possibility of fibromuscular dysplasia contributing. I don't however think there be significant pretest probability of other secondary causes of hypertension. We talked about continued medical management and I like to switch her to chlorthalidone. She understands the risk of pregnancy with Cozaar and she is actively preventing this. We talked about other therapeutic lifestyle changes that for the most part she is adhering to.    Current medicines are reviewed at length with the  patient today.  The patient does not have concerns regarding medicines.  The following changes have been made:  no change  Labs/ tests ordered today include:   Orders Placed This Encounter  Procedures  . TSH     Disposition:   FU with me as needed.      Signed, Rollene Rotunda, MD  12/20/2016 8:32 PM    [facility]

## 2016-12-18 ENCOUNTER — Encounter: Payer: Self-pay | Admitting: Cardiology

## 2016-12-18 ENCOUNTER — Ambulatory Visit (INDEPENDENT_AMBULATORY_CARE_PROVIDER_SITE_OTHER): Payer: BLUE CROSS/BLUE SHIELD | Admitting: Cardiology

## 2016-12-18 VITALS — BP 135/85 | HR 87 | Ht 62.0 in | Wt 151.8 lb

## 2016-12-18 DIAGNOSIS — R5383 Other fatigue: Secondary | ICD-10-CM | POA: Diagnosis not present

## 2016-12-18 DIAGNOSIS — R072 Precordial pain: Secondary | ICD-10-CM | POA: Diagnosis not present

## 2016-12-18 DIAGNOSIS — I10 Essential (primary) hypertension: Secondary | ICD-10-CM

## 2016-12-18 MED ORDER — CHLORTHALIDONE 25 MG PO TABS: 25.0000 mg | ORAL_TABLET | Freq: Every day | ORAL | 9 refills | Status: DC

## 2016-12-18 NOTE — Patient Instructions (Addendum)
Medication Instructions:  STOP HCTz START- Chlorthalidone 25 mg daily  Labwork: TSH  Testing/Procedures: Your physician has requested that you have a renal artery duplex. During this test, an ultrasound is used to evaluate blood flow to the kidneys. Allow one hour for this exam. Do not eat after midnight the day before and avoid carbonated beverages. Take your medications as you usually do.  Follow-Up: Your physician recommends that you schedule a follow-up appointment in: As Needed   Any Other Special Instructions Will Be Listed Below (If Applicable).   If you need a refill on your cardiac medications before your next appointment, please call your pharmacy.

## 2016-12-20 ENCOUNTER — Encounter: Payer: Self-pay | Admitting: Cardiology

## 2016-12-20 DIAGNOSIS — I10 Essential (primary) hypertension: Secondary | ICD-10-CM | POA: Insufficient documentation

## 2016-12-20 DIAGNOSIS — R5383 Other fatigue: Secondary | ICD-10-CM | POA: Insufficient documentation

## 2016-12-20 DIAGNOSIS — R072 Precordial pain: Secondary | ICD-10-CM | POA: Insufficient documentation

## 2016-12-22 DIAGNOSIS — R5383 Other fatigue: Secondary | ICD-10-CM | POA: Diagnosis not present

## 2016-12-23 LAB — TSH: TSH: 1.06 mIU/L

## 2017-01-08 ENCOUNTER — Ambulatory Visit (HOSPITAL_COMMUNITY)
Admission: RE | Admit: 2017-01-08 | Discharge: 2017-01-08 | Disposition: A | Discharge status: Home / Self Care | Payer: BLUE CROSS/BLUE SHIELD | Source: Ambulatory Visit | Attending: Cardiology | Admitting: Cardiology

## 2017-01-08 DIAGNOSIS — I10 Essential (primary) hypertension: Secondary | ICD-10-CM | POA: Diagnosis not present

## 2017-01-10 ENCOUNTER — Encounter: Payer: Self-pay | Admitting: Cardiology

## 2017-01-12 ENCOUNTER — Encounter: Payer: Self-pay | Admitting: Family Medicine

## 2017-02-05 ENCOUNTER — Other Ambulatory Visit: Payer: Self-pay | Admitting: *Deleted

## 2017-02-05 MED ORDER — CHLORTHALIDONE 25 MG PO TABS: 25.0000 mg | ORAL_TABLET | Freq: Every day | ORAL | 3 refills | Status: DC

## 2017-02-27 ENCOUNTER — Other Ambulatory Visit: Payer: Self-pay | Admitting: Family Medicine

## 2017-03-09 ENCOUNTER — Other Ambulatory Visit: Payer: Self-pay | Admitting: Gastroenterology

## 2017-03-09 DIAGNOSIS — R112 Nausea with vomiting, unspecified: Secondary | ICD-10-CM

## 2017-03-09 DIAGNOSIS — R140 Abdominal distension (gaseous): Secondary | ICD-10-CM | POA: Diagnosis not present

## 2017-03-09 DIAGNOSIS — R110 Nausea: Secondary | ICD-10-CM | POA: Diagnosis not present

## 2017-03-09 DIAGNOSIS — R194 Change in bowel habit: Secondary | ICD-10-CM | POA: Diagnosis not present

## 2017-03-09 DIAGNOSIS — R1011 Right upper quadrant pain: Secondary | ICD-10-CM | POA: Diagnosis not present

## 2017-03-09 LAB — HEPATIC FUNCTION PANEL
ALT: 15 (ref 7–35)
AST: 19 (ref 13–35)
Alkaline Phosphatase: 77 (ref 25–125)
Bilirubin, Total: 0.2

## 2017-03-12 ENCOUNTER — Ambulatory Visit (HOSPITAL_COMMUNITY)
Admission: RE | Admit: 2017-03-12 | Discharge: 2017-03-12 | Disposition: A | Discharge status: Home / Self Care | Payer: BLUE CROSS/BLUE SHIELD | Source: Ambulatory Visit | Attending: Gastroenterology | Admitting: Gastroenterology

## 2017-03-12 DIAGNOSIS — R112 Nausea with vomiting, unspecified: Secondary | ICD-10-CM | POA: Diagnosis not present

## 2017-03-12 DIAGNOSIS — R197 Diarrhea, unspecified: Secondary | ICD-10-CM | POA: Diagnosis not present

## 2017-03-12 DIAGNOSIS — R1011 Right upper quadrant pain: Secondary | ICD-10-CM | POA: Diagnosis not present

## 2017-03-17 ENCOUNTER — Encounter: Payer: Self-pay | Admitting: Family Medicine

## 2017-03-23 ENCOUNTER — Ambulatory Visit (HOSPITAL_COMMUNITY)
Admission: RE | Admit: 2017-03-23 | Discharge: 2017-03-23 | Disposition: A | Discharge status: Home / Self Care | Payer: BLUE CROSS/BLUE SHIELD | Source: Ambulatory Visit | Attending: Gastroenterology | Admitting: Gastroenterology

## 2017-03-23 DIAGNOSIS — R110 Nausea: Secondary | ICD-10-CM | POA: Insufficient documentation

## 2017-03-23 DIAGNOSIS — R112 Nausea with vomiting, unspecified: Secondary | ICD-10-CM

## 2017-03-23 DIAGNOSIS — R1011 Right upper quadrant pain: Secondary | ICD-10-CM | POA: Insufficient documentation

## 2017-03-23 MED ORDER — TECHNETIUM TC 99M MEBROFENIN IV KIT
5.0000 | PACK | Freq: Once | INTRAVENOUS | Status: AC | PRN
  Administered 2017-03-23: 5 via INTRAVENOUS

## 2017-04-06 DIAGNOSIS — Z3201 Encounter for pregnancy test, result positive: Secondary | ICD-10-CM | POA: Diagnosis not present

## 2017-04-30 DIAGNOSIS — Z3201 Encounter for pregnancy test, result positive: Secondary | ICD-10-CM | POA: Diagnosis not present

## 2017-05-21 DIAGNOSIS — O09521 Supervision of elderly multigravida, first trimester: Secondary | ICD-10-CM | POA: Diagnosis not present

## 2017-05-21 DIAGNOSIS — Z3A11 11 weeks gestation of pregnancy: Secondary | ICD-10-CM | POA: Diagnosis not present

## 2017-05-21 DIAGNOSIS — Z3689 Encounter for other specified antenatal screening: Secondary | ICD-10-CM | POA: Diagnosis not present

## 2017-05-21 LAB — OB RESULTS CONSOLE ABO/RH: RH Type: POSITIVE

## 2017-05-21 LAB — OB RESULTS CONSOLE RUBELLA ANTIBODY, IGM: Rubella: IMMUNE

## 2017-05-21 LAB — OB RESULTS CONSOLE ANTIBODY SCREEN: Antibody Screen: NEGATIVE

## 2017-05-21 LAB — OB RESULTS CONSOLE RPR: RPR: NONREACTIVE

## 2017-05-21 LAB — OB RESULTS CONSOLE HIV ANTIBODY (ROUTINE TESTING): HIV: NONREACTIVE

## 2017-05-21 LAB — OB RESULTS CONSOLE HEPATITIS B SURFACE ANTIGEN: Hepatitis B Surface Ag: NEGATIVE

## 2017-05-28 DIAGNOSIS — Z3685 Encounter for antenatal screening for Streptococcus B: Secondary | ICD-10-CM | POA: Diagnosis not present

## 2017-05-28 DIAGNOSIS — Z3A12 12 weeks gestation of pregnancy: Secondary | ICD-10-CM | POA: Diagnosis not present

## 2017-05-28 DIAGNOSIS — Z3682 Encounter for antenatal screening for nuchal translucency: Secondary | ICD-10-CM | POA: Diagnosis not present

## 2017-05-28 DIAGNOSIS — O09521 Supervision of elderly multigravida, first trimester: Secondary | ICD-10-CM | POA: Diagnosis not present

## 2017-05-28 LAB — OB RESULTS CONSOLE GC/CHLAMYDIA
Chlamydia: NEGATIVE
Gonorrhea: NEGATIVE

## 2017-06-17 ENCOUNTER — Encounter: Payer: Self-pay | Admitting: Family Medicine

## 2017-06-18 DIAGNOSIS — L814 Other melanin hyperpigmentation: Secondary | ICD-10-CM | POA: Diagnosis not present

## 2017-06-18 DIAGNOSIS — Z85828 Personal history of other malignant neoplasm of skin: Secondary | ICD-10-CM | POA: Diagnosis not present

## 2017-06-18 DIAGNOSIS — D225 Melanocytic nevi of trunk: Secondary | ICD-10-CM | POA: Diagnosis not present

## 2017-06-18 DIAGNOSIS — D2261 Melanocytic nevi of right upper limb, including shoulder: Secondary | ICD-10-CM | POA: Diagnosis not present

## 2017-06-18 DIAGNOSIS — L570 Actinic keratosis: Secondary | ICD-10-CM | POA: Diagnosis not present

## 2017-06-25 DIAGNOSIS — Z3A16 16 weeks gestation of pregnancy: Secondary | ICD-10-CM | POA: Diagnosis not present

## 2017-06-25 DIAGNOSIS — Z361 Encounter for antenatal screening for raised alphafetoprotein level: Secondary | ICD-10-CM | POA: Diagnosis not present

## 2017-06-25 DIAGNOSIS — O09522 Supervision of elderly multigravida, second trimester: Secondary | ICD-10-CM | POA: Diagnosis not present

## 2017-07-16 DIAGNOSIS — Z3A19 19 weeks gestation of pregnancy: Secondary | ICD-10-CM | POA: Diagnosis not present

## 2017-07-16 DIAGNOSIS — O09522 Supervision of elderly multigravida, second trimester: Secondary | ICD-10-CM | POA: Diagnosis not present

## 2017-08-12 DIAGNOSIS — Z8759 Personal history of other complications of pregnancy, childbirth and the puerperium: Secondary | ICD-10-CM | POA: Diagnosis not present

## 2017-08-12 DIAGNOSIS — Z23 Encounter for immunization: Secondary | ICD-10-CM | POA: Diagnosis not present

## 2017-08-12 DIAGNOSIS — O09522 Supervision of elderly multigravida, second trimester: Secondary | ICD-10-CM | POA: Diagnosis not present

## 2017-08-12 DIAGNOSIS — Z3A23 23 weeks gestation of pregnancy: Secondary | ICD-10-CM | POA: Diagnosis not present

## 2017-08-31 ENCOUNTER — Ambulatory Visit (INDEPENDENT_AMBULATORY_CARE_PROVIDER_SITE_OTHER): Payer: BLUE CROSS/BLUE SHIELD | Admitting: Family Medicine

## 2017-08-31 ENCOUNTER — Encounter: Payer: Self-pay | Admitting: Family Medicine

## 2017-08-31 VITALS — BP 104/80 | HR 78 | Temp 98.7°F | Wt 164.4 lb

## 2017-08-31 DIAGNOSIS — J029 Acute pharyngitis, unspecified: Secondary | ICD-10-CM | POA: Diagnosis not present

## 2017-08-31 LAB — POCT RAPID STREP A (OFFICE): Rapid Strep A Screen: POSITIVE — AB

## 2017-08-31 MED ORDER — CEPHALEXIN 500 MG PO CAPS: 500.0000 mg | ORAL_CAPSULE | Freq: Three times a day (TID) | ORAL | 0 refills | Status: AC

## 2017-08-31 NOTE — Progress Notes (Signed)
Subjective:    Patient ID: Alison [name], female    DOB: 12/01/1980, 36 y.o.   MRN: [id number]  HPI Here for 2 days of ST. No sinus congestion or cough. Her 64 yr old son is being treated for a strep throat right now. She is [redacted] weeks pregnant.   Review of Systems  Constitutional: Negative.   HENT: Positive for sore throat. Negative for congestion, postnasal drip, sinus pressure and sinus pain.   Eyes: Negative.   Respiratory: Negative.        Objective:   Physical Exam  Constitutional: She appears well-developed and well-nourished.  HENT:  Right Ear: External ear normal.  Left Ear: External ear normal.  Nose: Nose normal.  Posterior OP is red without exudate   Eyes: Conjunctivae are normal.  Neck: Neck supple. No thyromegaly present.  Pulmonary/Chest: Effort normal and breath sounds normal. No respiratory distress. She has no wheezes. She has no rales.  Lymphadenopathy:    She has no cervical adenopathy.          Assessment & Plan:  Strep throat, treat with Keflex. Add Tylenol prn.  Gershon Crane, MD

## 2017-09-14 DIAGNOSIS — Z3A27 27 weeks gestation of pregnancy: Secondary | ICD-10-CM | POA: Diagnosis not present

## 2017-09-14 DIAGNOSIS — Z3689 Encounter for other specified antenatal screening: Secondary | ICD-10-CM | POA: Diagnosis not present

## 2017-09-14 DIAGNOSIS — O09522 Supervision of elderly multigravida, second trimester: Secondary | ICD-10-CM | POA: Diagnosis not present

## 2017-09-28 NOTE — L&D Delivery Note (Signed)
Delivery Note At 2:13 PM a viable female was delivered via  (Presentation: OA to ROT;  Loose CAN x 1, reduced on perineum).  APGAR: 8, 9; weight pending .   Placenta status: S/C/I , gentle cord traction and LUS massage.  Cord:  3VC, cord blood collected for typing.  Anesthesia:  None, local lido for repair Episiotomy:  none Lacerations:  Bilateral labial slpays, repaired, hemostatic Suture Repair: vicryl 4.0 Est. Blood Loss (mL):  400cc  Mom to postpartum.  Baby "River"  to Couplet care / Skin to Skin.  Juliene Pina, CNM 12/03/2017, 2:37 PM

## 2017-10-01 DIAGNOSIS — Z3689 Encounter for other specified antenatal screening: Secondary | ICD-10-CM | POA: Diagnosis not present

## 2017-10-01 DIAGNOSIS — Z8759 Personal history of other complications of pregnancy, childbirth and the puerperium: Secondary | ICD-10-CM | POA: Diagnosis not present

## 2017-10-01 DIAGNOSIS — Z3A30 30 weeks gestation of pregnancy: Secondary | ICD-10-CM | POA: Diagnosis not present

## 2017-10-01 DIAGNOSIS — O09522 Supervision of elderly multigravida, second trimester: Secondary | ICD-10-CM | POA: Diagnosis not present

## 2017-10-07 ENCOUNTER — Encounter: Payer: Self-pay | Admitting: Family Medicine

## 2017-10-07 DIAGNOSIS — Z3A31 31 weeks gestation of pregnancy: Secondary | ICD-10-CM | POA: Diagnosis not present

## 2017-10-07 DIAGNOSIS — O09523 Supervision of elderly multigravida, third trimester: Secondary | ICD-10-CM | POA: Diagnosis not present

## 2017-10-15 DIAGNOSIS — Z23 Encounter for immunization: Secondary | ICD-10-CM | POA: Diagnosis not present

## 2017-10-15 DIAGNOSIS — O09523 Supervision of elderly multigravida, third trimester: Secondary | ICD-10-CM | POA: Diagnosis not present

## 2017-10-15 DIAGNOSIS — Z3A32 32 weeks gestation of pregnancy: Secondary | ICD-10-CM | POA: Diagnosis not present

## 2017-10-26 DIAGNOSIS — Z3A33 33 weeks gestation of pregnancy: Secondary | ICD-10-CM | POA: Diagnosis not present

## 2017-10-26 DIAGNOSIS — O09523 Supervision of elderly multigravida, third trimester: Secondary | ICD-10-CM | POA: Diagnosis not present

## 2017-10-26 DIAGNOSIS — Z8759 Personal history of other complications of pregnancy, childbirth and the puerperium: Secondary | ICD-10-CM | POA: Diagnosis not present

## 2017-11-09 DIAGNOSIS — Z3A35 35 weeks gestation of pregnancy: Secondary | ICD-10-CM | POA: Diagnosis not present

## 2017-11-09 DIAGNOSIS — Z3685 Encounter for antenatal screening for Streptococcus B: Secondary | ICD-10-CM | POA: Diagnosis not present

## 2017-11-09 DIAGNOSIS — Z8759 Personal history of other complications of pregnancy, childbirth and the puerperium: Secondary | ICD-10-CM | POA: Diagnosis not present

## 2017-11-09 DIAGNOSIS — O99113 Other diseases of the blood and blood-forming organs and certain disorders involving the immune mechanism complicating pregnancy, third trimester: Secondary | ICD-10-CM | POA: Diagnosis not present

## 2017-11-09 DIAGNOSIS — O09523 Supervision of elderly multigravida, third trimester: Secondary | ICD-10-CM | POA: Diagnosis not present

## 2017-11-09 LAB — OB RESULTS CONSOLE GBS: GBS: NEGATIVE

## 2017-11-16 DIAGNOSIS — Z3A36 36 weeks gestation of pregnancy: Secondary | ICD-10-CM | POA: Diagnosis not present

## 2017-11-16 DIAGNOSIS — O99113 Other diseases of the blood and blood-forming organs and certain disorders involving the immune mechanism complicating pregnancy, third trimester: Secondary | ICD-10-CM | POA: Diagnosis not present

## 2017-11-23 DIAGNOSIS — O09523 Supervision of elderly multigravida, third trimester: Secondary | ICD-10-CM | POA: Diagnosis not present

## 2017-11-23 DIAGNOSIS — Z3A37 37 weeks gestation of pregnancy: Secondary | ICD-10-CM | POA: Diagnosis not present

## 2017-12-02 DIAGNOSIS — Z3A39 39 weeks gestation of pregnancy: Secondary | ICD-10-CM | POA: Diagnosis not present

## 2017-12-02 DIAGNOSIS — O133 Gestational [pregnancy-induced] hypertension without significant proteinuria, third trimester: Secondary | ICD-10-CM | POA: Diagnosis not present

## 2017-12-02 DIAGNOSIS — O09523 Supervision of elderly multigravida, third trimester: Secondary | ICD-10-CM | POA: Diagnosis not present

## 2017-12-03 ENCOUNTER — Inpatient Hospital Stay (HOSPITAL_COMMUNITY): Payer: BLUE CROSS/BLUE SHIELD | Admitting: Anesthesiology

## 2017-12-03 ENCOUNTER — Encounter (HOSPITAL_COMMUNITY): Payer: Self-pay | Admitting: Obstetrics

## 2017-12-03 ENCOUNTER — Inpatient Hospital Stay (HOSPITAL_COMMUNITY)
Admission: AD | Admit: 2017-12-03 | Discharge: 2017-12-04 | DRG: 806 | Disposition: A | Discharge status: Home / Self Care | Payer: BLUE CROSS/BLUE SHIELD | Source: Ambulatory Visit | Attending: Obstetrics and Gynecology | Admitting: Obstetrics and Gynecology

## 2017-12-03 DIAGNOSIS — O99119 Other diseases of the blood and blood-forming organs and certain disorders involving the immune mechanism complicating pregnancy, unspecified trimester: Secondary | ICD-10-CM | POA: Diagnosis present

## 2017-12-03 DIAGNOSIS — Z7982 Long term (current) use of aspirin: Secondary | ICD-10-CM

## 2017-12-03 DIAGNOSIS — D6959 Other secondary thrombocytopenia: Secondary | ICD-10-CM | POA: Diagnosis present

## 2017-12-03 DIAGNOSIS — O9912 Other diseases of the blood and blood-forming organs and certain disorders involving the immune mechanism complicating childbirth: Secondary | ICD-10-CM | POA: Diagnosis present

## 2017-12-03 DIAGNOSIS — Z3A39 39 weeks gestation of pregnancy: Secondary | ICD-10-CM | POA: Diagnosis not present

## 2017-12-03 DIAGNOSIS — D696 Thrombocytopenia, unspecified: Secondary | ICD-10-CM

## 2017-12-03 DIAGNOSIS — O139 Gestational [pregnancy-induced] hypertension without significant proteinuria, unspecified trimester: Secondary | ICD-10-CM | POA: Diagnosis present

## 2017-12-03 DIAGNOSIS — O134 Gestational [pregnancy-induced] hypertension without significant proteinuria, complicating childbirth: Principal | ICD-10-CM | POA: Diagnosis present

## 2017-12-03 HISTORY — DX: Thrombocytopenia, unspecified: O99.119

## 2017-12-03 HISTORY — DX: Thrombocytopenia, unspecified: D69.6

## 2017-12-03 LAB — URIC ACID: Uric Acid, Serum: 4.3 mg/dL (ref 2.3–6.6)

## 2017-12-03 LAB — PROTEIN / CREATININE RATIO, URINE
Creatinine, Urine: 86 mg/dL
Protein Creatinine Ratio: 0.09 mg/mg{Cre} (ref 0.00–0.15)
Total Protein, Urine: 8 mg/dL

## 2017-12-03 LAB — COMPREHENSIVE METABOLIC PANEL
ALT: 35 U/L (ref 14–54)
AST: 31 U/L (ref 15–41)
Albumin: 3.1 g/dL — ABNORMAL LOW (ref 3.5–5.0)
Alkaline Phosphatase: 119 U/L (ref 38–126)
Anion gap: 9 (ref 5–15)
BUN: 16 mg/dL (ref 6–20)
CO2: 21 mmol/L — ABNORMAL LOW (ref 22–32)
Calcium: 9.2 mg/dL (ref 8.9–10.3)
Chloride: 105 mmol/L (ref 101–111)
Creatinine, Ser: 0.76 mg/dL (ref 0.44–1.00)
GFR calc Af Amer: >60 mL/min (ref >60)
GFR calc non Af Amer: >60 mL/min (ref >60)
Glucose, Bld: 94 mg/dL (ref 65–99)
Potassium: 4 mmol/L (ref 3.5–5.1)
Sodium: 135 mmol/L (ref 135–145)
Total Bilirubin: 0.9 mg/dL (ref 0.3–1.2)
Total Protein: 6.1 g/dL — ABNORMAL LOW (ref 6.5–8.1)

## 2017-12-03 LAB — CBC
HCT: 34.6 % — ABNORMAL LOW (ref 36.0–46.0)
Hemoglobin: 12.3 g/dL (ref 12.0–15.0)
MCH: 32.8 pg (ref 26.0–34.0)
MCHC: 35.5 g/dL (ref 30.0–36.0)
MCV: 92.3 fL (ref 78.0–100.0)
Platelets: 126 10*3/uL — ABNORMAL LOW (ref 150–400)
RBC: 3.75 MIL/uL — ABNORMAL LOW (ref 3.87–5.11)
RDW: 13.8 % (ref 11.5–15.5)
WBC: 11.1 10*3/uL — ABNORMAL HIGH (ref 4.0–10.5)

## 2017-12-03 LAB — TYPE AND SCREEN
ABO/RH(D): O POS
Antibody Screen: NEGATIVE

## 2017-12-03 LAB — RPR: RPR Ser Ql: NONREACTIVE

## 2017-12-03 MED ORDER — DIPHENHYDRAMINE HCL 50 MG/ML IJ SOLN: 12.5000 mg | INTRAMUSCULAR | Status: DC | PRN

## 2017-12-03 MED ORDER — OXYTOCIN BOLUS FROM INFUSION
500.0000 mL | Freq: Once | INTRAVENOUS | Status: AC
  Administered 2017-12-03: 500 mL via INTRAVENOUS

## 2017-12-03 MED ORDER — SENNOSIDES-DOCUSATE SODIUM 8.6-50 MG PO TABS
2.0000 | ORAL_TABLET | ORAL | Status: DC
  Administered 2017-12-03: 2 via ORAL
  Filled 2017-12-03: qty 2

## 2017-12-03 MED ORDER — IBUPROFEN 600 MG PO TABS
600.0000 mg | ORAL_TABLET | Freq: Four times a day (QID) | ORAL | Status: DC
  Administered 2017-12-03 – 2017-12-04 (×5): 600 mg via ORAL
  Filled 2017-12-03 (×5): qty 1

## 2017-12-03 MED ORDER — EPHEDRINE 5 MG/ML INJ
10.0000 mg | INTRAVENOUS | Status: DC | PRN
  Filled 2017-12-03: qty 2

## 2017-12-03 MED ORDER — OXYTOCIN 40 UNITS IN LACTATED RINGERS INFUSION - SIMPLE MED
1.0000 m[IU]/min | INTRAVENOUS | Status: DC
  Administered 2017-12-03: 2 m[IU]/min via INTRAVENOUS

## 2017-12-03 MED ORDER — PHENYLEPHRINE 40 MCG/ML (10ML) SYRINGE FOR IV PUSH (FOR BLOOD PRESSURE SUPPORT)
80.0000 ug | PREFILLED_SYRINGE | INTRAVENOUS | Status: DC | PRN
  Filled 2017-12-03: qty 5

## 2017-12-03 MED ORDER — LACTATED RINGERS IV SOLN: 500.0000 mL | Freq: Once | INTRAVENOUS | Status: DC

## 2017-12-03 MED ORDER — LACTATED RINGERS IV SOLN: 500.0000 mL | INTRAVENOUS | Status: DC | PRN

## 2017-12-03 MED ORDER — DIBUCAINE 1 % RE OINT: 1.0000 "application " | TOPICAL_OINTMENT | RECTAL | Status: DC | PRN

## 2017-12-03 MED ORDER — OXYTOCIN 40 UNITS IN LACTATED RINGERS INFUSION - SIMPLE MED
2.5000 [IU]/h | INTRAVENOUS | Status: DC
  Filled 2017-12-03: qty 1000

## 2017-12-03 MED ORDER — LABETALOL HCL 5 MG/ML IV SOLN: 20.0000 mg | INTRAVENOUS | Status: DC | PRN

## 2017-12-03 MED ORDER — LIDOCAINE HCL (PF) 1 % IJ SOLN
30.0000 mL | INTRAMUSCULAR | Status: DC | PRN
  Administered 2017-12-03: 30 mL via SUBCUTANEOUS
  Filled 2017-12-03: qty 30

## 2017-12-03 MED ORDER — TETANUS-DIPHTH-ACELL PERTUSSIS 5-2.5-18.5 LF-MCG/0.5 IM SUSP: 0.5000 mL | Freq: Once | INTRAMUSCULAR | Status: DC

## 2017-12-03 MED ORDER — BENZOCAINE-MENTHOL 20-0.5 % EX AERO
1.0000 "application " | INHALATION_SPRAY | CUTANEOUS | Status: DC | PRN
  Administered 2017-12-03: 1 via TOPICAL
  Filled 2017-12-03: qty 56

## 2017-12-03 MED ORDER — ONDANSETRON HCL 4 MG/2ML IJ SOLN: 4.0000 mg | Freq: Four times a day (QID) | INTRAMUSCULAR | Status: DC | PRN

## 2017-12-03 MED ORDER — TERBUTALINE SULFATE 1 MG/ML IJ SOLN
0.2500 mg | Freq: Once | INTRAMUSCULAR | Status: DC | PRN
  Filled 2017-12-03: qty 1

## 2017-12-03 MED ORDER — BISACODYL 10 MG RE SUPP: 10.0000 mg | Freq: Every day | RECTAL | Status: DC | PRN

## 2017-12-03 MED ORDER — DIPHENHYDRAMINE HCL 25 MG PO CAPS: 25.0000 mg | ORAL_CAPSULE | Freq: Four times a day (QID) | ORAL | Status: DC | PRN

## 2017-12-03 MED ORDER — HYDRALAZINE HCL 20 MG/ML IJ SOLN
10.0000 mg | Freq: Once | INTRAMUSCULAR | Status: DC | PRN
Start: 2017-12-03 — End: 2017-12-03

## 2017-12-03 MED ORDER — ONDANSETRON HCL 4 MG/2ML IJ SOLN
4.0000 mg | INTRAMUSCULAR | Status: DC | PRN
Start: 2017-12-03 — End: 2017-12-05

## 2017-12-03 MED ORDER — ONDANSETRON HCL 4 MG PO TABS: 4.0000 mg | ORAL_TABLET | ORAL | Status: DC | PRN

## 2017-12-03 MED ORDER — LACTATED RINGERS IV SOLN
INTRAVENOUS | Status: DC
  Administered 2017-12-03: 09:00:00 via INTRAVENOUS

## 2017-12-03 MED ORDER — PHENYLEPHRINE 40 MCG/ML (10ML) SYRINGE FOR IV PUSH (FOR BLOOD PRESSURE SUPPORT)
PREFILLED_SYRINGE | INTRAVENOUS | Status: AC
  Filled 2017-12-03: qty 20

## 2017-12-03 MED ORDER — ZOLPIDEM TARTRATE 5 MG PO TABS: 5.0000 mg | ORAL_TABLET | Freq: Every evening | ORAL | Status: DC | PRN

## 2017-12-03 MED ORDER — FLEET ENEMA 7-19 GM/118ML RE ENEM: 1.0000 | ENEMA | Freq: Every day | RECTAL | Status: DC | PRN

## 2017-12-03 MED ORDER — ACETAMINOPHEN 325 MG PO TABS: 650.0000 mg | ORAL_TABLET | ORAL | Status: DC | PRN

## 2017-12-03 MED ORDER — COCONUT OIL OIL: 1.0000 "application " | TOPICAL_OIL | Status: DC | PRN

## 2017-12-03 MED ORDER — SOD CITRATE-CITRIC ACID 500-334 MG/5ML PO SOLN
30.0000 mL | ORAL | Status: DC | PRN
Start: 2017-12-03 — End: 2017-12-03

## 2017-12-03 MED ORDER — SIMETHICONE 80 MG PO CHEW: 80.0000 mg | CHEWABLE_TABLET | ORAL | Status: DC | PRN

## 2017-12-03 MED ORDER — PRENATAL MULTIVITAMIN CH
1.0000 | ORAL_TABLET | Freq: Every day | ORAL | Status: DC
  Administered 2017-12-04: 1 via ORAL
  Filled 2017-12-03: qty 1

## 2017-12-03 MED ORDER — FENTANYL 2.5 MCG/ML BUPIVACAINE 1/10 % EPIDURAL INFUSION (WH - ANES)
INTRAMUSCULAR | Status: AC
  Filled 2017-12-03: qty 100

## 2017-12-03 MED ORDER — FENTANYL 2.5 MCG/ML BUPIVACAINE 1/10 % EPIDURAL INFUSION (WH - ANES): 14.0000 mL/h | INTRAMUSCULAR | Status: DC | PRN

## 2017-12-03 MED ORDER — WITCH HAZEL-GLYCERIN EX PADS: 1.0000 "application " | MEDICATED_PAD | CUTANEOUS | Status: DC | PRN

## 2017-12-03 NOTE — Progress Notes (Signed)
S:  Pt. Ambulating in halls.  On 8 milliunits of Pitocin.  Feeling stronger contractions during ambulation, but breathing well through them.    O:  VS: Blood pressure (!) 148/97, pulse 93, resp. rate 18, height 5\' 2"  (1.575 m), weight 80.7 kg (178 lb), last menstrual period 03/03/2017.        FHR : baseline 145 bpm / variability moderate / accelerations 15x15 x 2 / no  decelerations        Toco: contractions every 2-3 minutes / moderate         Cervix : deferred        Membranes: intact  A: Latent labor     FHR category 1     Gestational HTN - no s/s of PEC; BPs in mild range      GBS Negative      Gestational thrombocytopenia   P: Keep Pitocin on 8 milliunits unless ctxs space out     Encouraged to continue ambulation      Nitrous oxide PRN      Continue monitoring BPs     Anticipate NSVD     Lars Pinks, MSN, CNM [facility]

## 2017-12-03 NOTE — Progress Notes (Signed)
BP noted. Pt states she has had elevated BP with pregnacy. Pt denies any HA, Blurred vision, scotoma, or URQ pain.

## 2017-12-03 NOTE — Anesthesia Pain Management Evaluation Note (Signed)
  CRNA Pain Management Visit Note  Patient: Alison [name], 37 y.o., female  "Hello I am a member of the anesthesia team at [facility]. We have an anesthesia team available at all times to provide care throughout the hospital, including epidural management and anesthesia for C-section. I don't know your plan for the delivery whether it a natural birth, water birth, IV sedation, nitrous supplementation, doula or epidural, but we want to meet your pain goals."   1.Was your pain managed to your expectations on prior hospitalizations?   Yes   2.What is your expectation for pain management during this hospitalization?     Labor support without medications, Epidural, IV pain meds and Nitrous Oxide  3.How can we help you reach that goal? Pt had epidural with last delivery, but is not sure she wants another. All methods of pain control discussed and questions answered.  Record the patient's initial score and the patient's pain goal.   Pain: 1  Pain Goal: 10 The [facility] wants you to be able to say your pain was always managed very well.  Jamario Colina 12/03/2017

## 2017-12-03 NOTE — H&P (Signed)
OB ADMISSION/ HISTORY & PHYSICAL:  Admission Date: 12/03/2017  7:36 AM  Admit Diagnosis: IOL for Gestational Hypertension at 39+2 weeks   Alison [name] is a 37 y.o. female G2P1 at 71+2 presenting for induction of labor for gestational hypertension.  She had mild range BPs 144/90 in office yesterday, and at 38 weeks, BP was 142/88.  Today, she reports a mild headache, but did not sleep well.  She denies scotoma or RUQ/epigastric pain.  She has had low platelets during pregnancy c/w gestational thrombocytopenia with last platelet count of 140 at 36 weeks.   Prenatal History: G2P1001   EDC : 12/08/2017, by Last Menstrual Period  Prenatal care at [place] since 11 weeks  Primary Ob Provider: CNM/ Artelia Laroche, CNM  Prenatal course complicated by  - Hx. Of GHTN with IOL requiring medications PP - Basal cell carcinoma on nose removed  - Bilateral choroid plexus cysts on CUS: isolated finding, normal AFP and Informaseq; no f/u testing  - AMA - Gestational Thrombocytopenia   Prenatal Labs: ABO, Rh: O/Positive/-- (08/24 0000) Antibody: Negative (08/24 0000) Rubella: Immune (08/24 0000)  RPR: Nonreactive (08/24 0000)  HBsAg: Negative (08/24 0000)  HIV: Non-reactive (08/24 0000)  GTT: 134 GBS: Negative (02/12 0000)  CUS: bilateral CPC, female, posterior placenta  35 week growth sono: AGA at 69%, symmetrical, vtx, AFI 15  Medical / Surgical History :  Past medical history:  Past Medical History:  Diagnosis Date  . Anxiety and depression   . Cancer ([place]) 03/2014   basal carcinoma  . Gestational hypertension 11/2011  . Nephrolithiasis   . Seasonal allergies      Past surgical history:  Past Surgical History:  Procedure Laterality Date  . MOHS SURGERY  03/2014   BCC on nose.    Family History:  Family History  Problem Relation Age of Onset  . Arthritis Mother   . Ankylosing spondylitis Mother   . Arthritis Father   . Other Father        car accident  . Arthritis Maternal  Grandmother        Rheumatoid Arthritis  . Breast cancer Maternal Grandmother   . Diabetes Other   . Hypertension Other        grandparent  . Stroke Other        grandparent     Social History:  reports that  has never smoked. she has never used smokeless tobacco. She reports that she drinks alcohol. She reports that she does not use drugs.   Allergies: Patient has no known allergies.    Current Medications at time of admission:  Prior to Admission medications   Medication Sig Start Date End Date Taking? Authorizing Provider  acetaminophen (TYLENOL) 325 MG tablet Take 650 mg by mouth every 6 (six) hours as needed for mild pain or headache.   Yes [provider]  aspirin EC 81 MG tablet Take 81 mg by mouth daily.   Yes [provider]  Prenatal Vit-Fe Fumarate-FA (PRENATAL MULTIVITAMIN) TABS tablet Take 1 tablet by mouth daily at 12 noon.   Yes [provider]     Review of Systems: Active FM Irregular contractions No LOF  / SROM  Brown bloody show present   Physical Exam:  VS: Blood pressure (!) 143/103, pulse (!) 104, height 5\' 2"  (1.575 m), weight 80.7 kg (178 lb), last menstrual period 03/03/2017. Vitals:   12/03/17 0811 12/03/17 0852 12/03/17 0901 12/03/17 0934  BP: (!) 143/103 (!) 150/98 Marland Kitchen)  139/98 (!) 148/101  Pulse: (!) 104 93 93 92  Resp:   18 18  Weight: 80.7 kg (178 lb)     Height: 5\' 2"  (1.575 m)      Results for orders placed or performed during the hospital encounter of 12/03/17 (from the past 24 hour(s))  Uric acid     Status: None   Collection Time: 12/03/17  8:30 AM  Result Value Ref Range   Uric Acid, Serum 4.3 2.3 - 6.6 mg/dL  Comprehensive metabolic panel     Status: Abnormal   Collection Time: 12/03/17  8:30 AM  Result Value Ref Range   Sodium 135 135 - 145 mmol/L   Potassium 4.0 3.5 - 5.1 mmol/L   Chloride 105 101 - 111 mmol/L   CO2 21 (L) 22 - 32 mmol/L   Glucose, Bld 94 65 - 99 mg/dL   BUN 16 6 - 20 mg/dL    Creatinine, Ser 0.76 0.44 - 1.00 mg/dL   Calcium 9.2 8.9 - 10.3 mg/dL   Total Protein 6.1 (L) 6.5 - 8.1 g/dL   Albumin 3.1 (L) 3.5 - 5.0 g/dL   AST 31 15 - 41 U/L   ALT 35 14 - 54 U/L   Alkaline Phosphatase 119 38 - 126 U/L   Total Bilirubin 0.9 0.3 - 1.2 mg/dL   GFR calc non Af Amer >60 >60 mL/min   GFR calc Af Amer >60 >60 mL/min   Anion gap 9 5 - 15  CBC     Status: Abnormal   Collection Time: 12/03/17  8:30 AM  Result Value Ref Range   WBC 11.1 (H) 4.0 - 10.5 K/uL   RBC 3.75 (L) 3.87 - 5.11 MIL/uL   Hemoglobin 12.3 12.0 - 15.0 g/dL   HCT 34.6 (L) 36.0 - 46.0 %   MCV 92.3 78.0 - 100.0 fL   MCH 32.8 26.0 - 34.0 pg   MCHC 35.5 30.0 - 36.0 g/dL   RDW 13.8 11.5 - 15.5 %   Platelets 126 (L) 150 - 400 K/uL  Protein / creatinine ratio, urine     Status: None   Collection Time: 12/03/17  9:35 AM  Result Value Ref Range   Creatinine, Urine 86.00 mg/dL   Total Protein, Urine 8 mg/dL   Protein Creatinine Ratio 0.09 0.00 - 0.15 mg/mg[Cre]   General: alert and oriented, appears anxious Heart: RRR Lungs: Clear lung fields Abdomen: Gravid, soft and non-tender, non-distended / uterus: gravid non-tender Extremities: trace pedal edema and edema in hands, DTRs +2, no clonus   Genitalia / VE: Dilation: 3 Effacement (%): 70 Station: -3 Exam by:: Meridith, CNM  FHR: baseline rate 140 bpm / variability moderate / accelerations + 15x15 / no decelerations TOCO: every 2-4 minutes/ mild   Assessment: 39+[redacted] weeks gestation IOL for Gestational Hypertension Gestational Thrombocytopenia FHR category 1 GBS Negative    Plan:  1. Admit to [facility]   - Routine labor and delivery orders   - Pain management: nitrous or epidural    - Method of induction: Pitocin 2 milliunits x 2 milliunits    - Watch BPs closely; PIH protocol initiated   - Platelets 126, but otherwise normal PIH labs, no evidence of PEC 2. GBS Negative   - No prophylaxis indication 3. Postpartum:   - Breast   -  Contraception: unsure   - Desires circumcision for baby 4. Anticipate MOD:   - Proven pelvis: 7#15.7    Dr. Ronita Hipps notified of admission /  plan of care  Lars Pinks, MSN, CNM [facility]

## 2017-12-03 NOTE — Progress Notes (Signed)
Patient has history of hypertension with previous pregnancy.   Rn called Sigmon CNM regarding patient's blood pressure of 142/92 . Patient has no headache , no blurred vision, no clonus and reflexes are WNL.   Sigmon CNM gave orders to call if bp is 160/110 or greater. Cnm will reevaluate in the AM.

## 2017-12-03 NOTE — Anesthesia Preprocedure Evaluation (Deleted)
Anesthesia Evaluation  Patient identified by MRN, date of birth, ID band Patient awake    Reviewed: Allergy & Precautions, H&P , Patient's Chart, lab work & pertinent test results  Airway Mallampati: II  TM Distance: >3 FB Neck ROM: full    Dental  (+) Teeth Intact   Pulmonary    breath sounds clear to auscultation       Cardiovascular hypertension,  Rhythm:regular Rate:Normal     Neuro/Psych    GI/Hepatic   Endo/Other    Renal/GU      Musculoskeletal   Abdominal   Peds  Hematology   Anesthesia Other Findings       Reproductive/Obstetrics (+) Pregnancy                             Anesthesia Physical  Anesthesia Plan  ASA: II  Anesthesia Plan: Epidural   Post-op Pain Management:    Induction:   PONV Risk Score and Plan:   Airway Management Planned:   Additional Equipment:   Intra-op Plan:   Post-operative Plan:   Informed Consent: I have reviewed the patients History and Physical, chart, labs and discussed the procedure including the risks, benefits and alternatives for the proposed anesthesia with the patient or authorized representative who has indicated his/her understanding and acceptance.     Dental Advisory Given  Plan Discussed with:   Anesthesia Plan Comments: (Labs checked- platelets confirmed with RN in room. Fetal heart tracing, per RN, reported to be stable enough for sitting procedure. Discussed epidural, and patient consents to the procedure:  included risk of possible headache,backache, failed block, allergic reaction, and nerve injury. This patient was asked if she had any questions or concerns before the procedure started.)        Anesthesia Quick Evaluation  

## 2017-12-04 ENCOUNTER — Inpatient Hospital Stay (HOSPITAL_COMMUNITY): Payer: BLUE CROSS/BLUE SHIELD

## 2017-12-04 LAB — CBC
HCT: 30.5 % — ABNORMAL LOW (ref 36.0–46.0)
Hemoglobin: 11 g/dL — ABNORMAL LOW (ref 12.0–15.0)
MCH: 33.3 pg (ref 26.0–34.0)
MCHC: 36.1 g/dL — ABNORMAL HIGH (ref 30.0–36.0)
MCV: 92.4 fL (ref 78.0–100.0)
Platelets: 131 10*3/uL — ABNORMAL LOW (ref 150–400)
RBC: 3.3 MIL/uL — ABNORMAL LOW (ref 3.87–5.11)
RDW: 13.8 % (ref 11.5–15.5)
WBC: 13.2 10*3/uL — ABNORMAL HIGH (ref 4.0–10.5)

## 2017-12-04 MED ORDER — DIBUCAINE 1 % RE OINT: 1.0000 "application " | TOPICAL_OINTMENT | RECTAL | 0 refills | Status: DC | PRN

## 2017-12-04 MED ORDER — IBUPROFEN 600 MG PO TABS: 600.0000 mg | ORAL_TABLET | Freq: Four times a day (QID) | ORAL | 0 refills | Status: DC

## 2017-12-04 MED ORDER — COCONUT OIL OIL: 1.0000 "application " | TOPICAL_OIL | 0 refills | Status: DC | PRN

## 2017-12-04 MED ORDER — BENZOCAINE-MENTHOL 20-0.5 % EX AERO: 1.0000 "application " | INHALATION_SPRAY | CUTANEOUS | Status: DC | PRN

## 2017-12-04 MED ORDER — WITCH HAZEL-GLYCERIN EX PADS: 1.0000 "application " | MEDICATED_PAD | CUTANEOUS | 12 refills | Status: DC | PRN

## 2017-12-04 MED ORDER — VENLAFAXINE HCL ER 75 MG PO CP24: 75.0000 mg | ORAL_CAPSULE | Freq: Every day | ORAL | 3 refills | Status: DC

## 2017-12-04 NOTE — Progress Notes (Signed)
Saline lock clean, dry and intact.

## 2017-12-04 NOTE — Discharge Summary (Addendum)
Obstetric Discharge Summary Reason for Admission: induction of labor Prenatal Procedures: ultrasound Intrapartum Procedures: spontaneous vaginal delivery and bilateral labial laceration Postpartum Procedures: none Complications-Operative and Postpartum: Bilat labial laceration Hemoglobin  Date Value Ref Range Status  12/04/2017 11.0 (L) 12.0 - 15.0 g/dL Final   HCT  Date Value Ref Range Status  12/04/2017 30.5 (L) 36.0 - 46.0 % Final    Physical Exam:  General: alert, cooperative and no distress Lochia: appropriate Uterine Fundus: firm DVT Evaluation: No evidence of DVT seen on physical exam. No cords or calf tenderness. No significant calf/ankle edema.  Discharge Diagnoses: Term Pregnancy-delivered and Destational hypertension, Hx of Anxiety and Depression  Discharge Information: Date: 12/04/2017 Activity: pelvic rest  Diet: routine Medications:  Allergies as of 12/04/2017   No Known Allergies     Medication List    TAKE these medications   acetaminophen 325 MG tablet Commonly known as:  TYLENOL Take 650 mg by mouth every 6 (six) hours as needed for mild pain or headache.   benzocaine-Menthol 20-0.5 % Aero Commonly known as:  DERMOPLAST Apply 1 application topically as needed for irritation (perineal discomfort).   coconut oil Oil Apply 1 application topically as needed.   dibucaine 1 % Oint Commonly known as:  NUPERCAINAL Place 1 application rectally as needed for hemorrhoids.   ibuprofen 600 MG tablet Commonly known as:  ADVIL,MOTRIN Take 1 tablet (600 mg total) by mouth every 6 (six) hours.   prenatal multivitamin Tabs tablet Take 1 tablet by mouth daily at 12 noon.   venlafaxine XR 75 MG 24 hr capsule Commonly known as:  EFFEXOR XR Take 1 capsule (75 mg total) by mouth daily.   witch hazel-glycerin pad Commonly known as:  TUCKS Apply 1 application topically as needed for hemorrhoids.      Condition: stable Instructions: refer to practice specific  booklet Discharge [id number] in [place], [place], CNM. Schedule an appointment as soon as possible for a visit in 2 week(s).   Specialty:  Obstetrics and Gynecology Contact information: [place] [address] [phone]           Newborn Data: Live born female "M[place]row" Birth Weight: 7 lb 15 oz (3600 g) APGAR: 77, 9  Newborn Delivery   Birth date/time:  12/03/2017 14:13:00 Delivery type:  Vaginal, Spontaneous     Home with Remains in-patient for ABO/Rh incompatability monitoring.  Arrie Eastern 12/04/2017, 11:39 AM   Juliene Pina, MSN, CNM 12/04/2017, 12:30 PM

## 2017-12-04 NOTE — Progress Notes (Addendum)
PPD # 1 SVD Information for the patient's newborn:  Dhriti, Fales [[id number]]  female      Breast feeding  / Circumcision Completed  Baby name: "River"    S:  Reports feeling well             Tolerating po/ No nausea or vomiting             Bleeding is light, no clots             Pain controlled with PO meds             Up ad lib / ambulatory / voiding without difficulties        O:  A & O x 3, in no apparent distress              VS:  Vitals:   12/03/17 1600 12/03/17 1703 12/03/17 2048 12/04/17 0410  BP: (!) 131/95 136/87 (!) 142/92 125/84  Pulse: 93 84 86 65  Resp: 18 18 18 18   Temp: 98.6 F (37 C) (!) 97.3 F (36.3 C) 97.7 F (36.5 C) (!) 97.1 F (36.2 C)  TempSrc: Oral Oral Oral Oral  Weight:      Height:        LABS:  Recent Labs    12/03/17 0830 12/04/17 0520  WBC 11.1* 13.2*  HGB 12.3 11.0*  HCT 34.6* 30.5*  PLT 126* 131*    Blood type: --/--/O POS (03/08 0830)  Rubella: Immune (08/24 0000)   I&O: I/O last 3 completed shifts: In: -  Out: 400 [Blood:400]          No intake/output data recorded.  Lungs: Clear and unlabored  Heart: regular rate and rhythm / no murmurs  Abdomen: soft, non-tender, non-distended             Fundus: firm, non-tender, U-2  Perineum: intact, mild edema, 1 small hemorrhoid  Lochia: small   Extremities: No edema, no calf pain or tenderness    A/P: PPD # 1 37 y.o., [id number]   Principal Problem:   SVD (spontaneous vaginal delivery) 3/8 Active Problems:   Gestational hypertension  - normotensive postpartum  - no evidence of PEC   Postpartum care following vaginal delivery   Benign gestational thrombocytopenia (HCC)  - asymptomatic, platelets improved   Obstetrical laceration - bilateral perilabial splays Hx of anxiety/depression  - desires to resume SSRI prophylactic, venlafaxine effective in the past  - will restart at low dose and f/u closely in office, increased risk of PPD d/t hx of depression  - self-care  reinforced   Doing well - stable status  Routine post partum orders  Discharge to rooming in. Infant to remain for 24 hours 2/2 ABO incompatibilities  - F/U at [facility] in 2 wks and PRN   Arrie Eastern, MSN, CNM 12/04/2017, 11:32 AM   Juliene Pina, MSN, CNM 12/04/2017, 12:26 PM

## 2017-12-04 NOTE — Progress Notes (Signed)
MOB was referred for history of depression/anxiety. * Referral screened out by Clinical Social Worker because none of the following criteria appear to apply: ~ History of anxiety/depression during this pregnancy, or of post-partum depression. ~ Diagnosis of anxiety and/or depression within last 3 years; No concerns noted in OB record. OR * MOB's symptoms currently being treated with medication and/or therapy.  Please contact the Clinical Social Worker if needs arise, by MOB request, or if MOB scores greater than 9/yes to question 10 on Edinburgh Postpartum Depression Screen.  Peirce Deveney Boyd-Gilyard, MSW, LCSW Clinical Social Work [phone]  

## 2017-12-04 NOTE — Lactation Note (Signed)
This note was copied from a baby's chart. Lactation Consultation Note Baby 5 hrs old. Mom BF in cross cradle position when LC entered rm. Mom using breast friends nursing pillow. Baby had great body alignment. Encouraged to have STS and BF w/no blankets. Latched looked good, had good depth, mom denied painful latch. Heard slight occasionaly swallow.  Mom has flat nipples. Very compressible, everts w/stimulation, then flattens.  Mom holding breast down in front of nose d/t baby turned blue while feeding after delivery. Discussed positioning and head tilt. Hand expressed easy flow of colostrum. Encouraged mom to assess breast before and after feeding for transfer.  Newborn feeding habits, STS, I&O, cluster feeding, and breast massage discussed. Mom encouraged to feed baby 8-12 times/24 hours and with feeding cues.  [place] brochure given w/resources, support groups and [place] services.  Patient Name: Boy Caryl Fate [id number] Date: 12/04/2017 Reason for consult: Initial assessment   Maternal Data Has patient been taught Hand Expression?: Yes Does the patient have breastfeeding experience prior to this delivery?: Yes  Feeding Feeding Type: Breast Fed Length of feed: 20 min  LATCH Score Latch: Grasps breast easily, tongue down, lips flanged, rhythmical sucking.  Audible Swallowing: A few with stimulation  Type of Nipple: Flat  Comfort (Breast/Nipple): Soft / non-tender  Hold (Positioning): No assistance needed to correctly position infant at breast.  LATCH Score: 8  Interventions Interventions: Breast feeding basics reviewed;Breast compression;Breast massage;Support pillows;Hand express;Position options  Lactation Tools Discussed/Used WIC Program: No   Consult Status Consult Status: Follow-up Date: 12/04/17 Follow-up type: In-patient    Kamaal Cast, Elta [place] 12/04/2017, 12:04 AM

## 2017-12-05 ENCOUNTER — Ambulatory Visit: Payer: Self-pay

## 2017-12-05 NOTE — Lactation Note (Signed)
This note was copied from a baby's chart. Lactation Consultation Note  Patient Name: Alison [name] [id number] Date: 12/05/2017 Reason for consult: Follow-up assessment;Infant weight loss(7% weight loss )  Baby is 49 hours old  LC reviewed and updated doc flow sheets per mom  WNL for D/C and the voids and stools correlate with weight loss  Per mom breast are fuller, and increased swallows, nipples some soreness. LC assessed with moms permission and noted nipples to be pink , no breakdown,  Probably transient due to to milk coming in.  Baby hungry, and rooting.  LC assisted to show mom and dad how to use the football  Position on the right breast with breast feeding pillow.  Baby lasted easily, with depth, FISH lips, and fed for 15 mins  Multiple swallows, increased with breast compressions.  Nipple well rounded when baby released.  Sore nipple and engorgement prevention and tx reviewed.  LC instructed mom on the use shells, comfort gels between feedings except when sleeping.  Hand pump for pre-pumping or if the baby only feeds on the 1st breast.  Per mom has  DEBP at home.  Mother informed of post-discharge support and given phone number to the lactation department, including services for phone call assistance; out-patient appointments; and breastfeeding support group. List of other breastfeeding resources in the community given in the handout. Encouraged mother to call for problems or concerns related to breastfeeding.   Maternal Data Has patient been taught Hand Expression?: Yes  Feeding Feeding Type: Breast Fed  LATCH Score Latch: Grasps breast easily, tongue down, lips flanged, rhythmical sucking.  Audible Swallowing: Spontaneous and intermittent  Type of Nipple: Everted at rest and after stimulation  Comfort (Breast/Nipple): Filling, red/small blisters or bruises, mild/mod discomfort  Hold (Positioning): Assistance needed to correctly position infant at breast and maintain  latch.  LATCH Score: 8  Interventions Interventions: Breast feeding basics reviewed;Assisted with latch;Skin to skin;Breast massage;Breast compression;Adjust position;Support pillows;Position options;Shells;Hand pump  Lactation Tools Discussed/Used Pump Review: Setup, frequency, and cleaning;Milk Storage Initiated by:: MAI  Date initiated:: 12/05/17   Consult Status Consult Status: Follow-up Date: 12/05/17 Follow-up type: In-patient    [place] 12/05/2017, 10:37 AM

## 2018-01-03 DIAGNOSIS — N644 Mastodynia: Secondary | ICD-10-CM | POA: Diagnosis not present

## 2018-05-03 DIAGNOSIS — Z01419 Encounter for gynecological examination (general) (routine) without abnormal findings: Secondary | ICD-10-CM | POA: Diagnosis not present

## 2018-05-03 DIAGNOSIS — Z1151 Encounter for screening for human papillomavirus (HPV): Secondary | ICD-10-CM | POA: Diagnosis not present

## 2018-07-10 NOTE — Progress Notes (Signed)
HPI:  Using dictation device. Unfortunately this device frequently misinterprets words/phrases.  Acute visit for Hip Pain: -started during pregnancy, about 8 months ago -intermittent, mild, dull pain L lat hip with getting up from chair, walking for exercise -no fevers, radiation, back pain, weakness, numbness, bowel or baldder incontinence  Fatigue: -new baby this past spring, sleep not great -fatigued some -nursing -no fevers, unexplained wt loss, dig depression - mild but seeking gyn for this and on lexapro - denies any severe symptoms currently, feels is doing ok from mood standpoint -wants to check thyroid and cbc as has hx anemia    ROS: See pertinent positives and negatives per HPI.  Past Medical History:  Diagnosis Date  . Anxiety and depression   . Cancer ([facility]) 03/2014   basal carcinoma  . Gestational hypertension 11/2011  . Nephrolithiasis   . Seasonal allergies     Past Surgical History:  Procedure Laterality Date  . MOHS SURGERY  03/2014   BCC on nose.    Family History  Problem Relation Age of Onset  . Arthritis Mother   . Ankylosing spondylitis Mother   . Arthritis Father   . Other Father        car accident  . Arthritis Maternal Grandmother        Rheumatoid Arthritis  . Breast cancer Maternal Grandmother   . Diabetes Other   . Hypertension Other        grandparent  . Stroke Other        grandparent    SOCIAL HX: see hpi   Current Outpatient Medications:  .  Cholecalciferol (VITAMIN D PO), Take by mouth., Disp: , Rfl:  .  escitalopram (LEXAPRO) 10 MG tablet, Take 10 mg by mouth daily., Disp: , Rfl: 4 .  Multiple Vitamin (MULTIVITAMIN) capsule, Take 1 capsule by mouth daily., Disp: , Rfl:  .  Omega-3 Fatty Acids (FISH OIL PO), Take by mouth., Disp: , Rfl:   EXAM:  Vitals:   07/11/18 1015  BP: 110/80  Pulse: 60  Temp: 97.6 F (36.4 C)    Body mass index is 25.55 kg/m.  GENERAL: vitals reviewed and listed above, alert, oriented,  appears well hydrated and in no acute distress  HEENT: atraumatic, conjunttiva clear, no obvious abnormalities on inspection of external nose and ears  NECK: no obvious masses on inspection  MS: moves all extremities without noticeable abnormality, Normal Gait Normal inspection of back, no obvious scoliosis or leg length descrepancy No bony TTP Soft tissue TTP at: l IT band with tenderpoint L mid IT band, R ant innominate, L 4-5 RrSl -/+ tests: neg trendelenburg,-facet loading, -SLRT, -CLRT, -FABER, -FADIR Normal muscle strength, sensation to light touch and DTRs in LEs bilaterally   PSYCH: pleasant and cooperative, no obvious depression or anxiety  ASSESSMENT AND PLAN:  Discussed the following assessment and plan:  Left hip pain Somatic dysfunction of lower extremity Somatic dysfunction of pelvis region Somatic dysfunction of lumbar region -we discussed possible serious and likely etiologies, workup and treatment, treatment risks and return precautions -after this discussion, Alison [name] opted for OMM today (see below), home exercises, ice, follow up 1 month if symptoms persist -of course, we advised Alison [name]  to return or notify a doctor immediately if symptoms worsen or persist or new concerns arise.  PROCEDURE NOTE : OSTEOPATHIC TREATMENT The decision today to treat with gentle Osteopathic Manipulative Therapy  (OMT) was based on physical exam findings, diagnoses and patient wishes. Verbal consent was obtained after after  explanation of risks and benefits. No Cervical HVLA manipulation was performed. After consent was obtained, treatment was  performed as below:      Regions treated:  LE, pelvis, lumbar     Techniques used: counterstrain, MR The patient tolerated the treatment well and reported Improved  symptoms following treatment today.   Other fatigue - Plan: Basic metabolic panel, CBC, Hemoglobin A1c, TSH, Vitamin B12, Ferritin -labs per order, hoping will get  more rest now that baby is sleeping better  Hx of major depression -seeing gyn for management, she feels is well controlled currently  -Patient advised to return or notify a doctor immediately if symptoms worsen or persist or new concerns arise.  Patient Instructions  BEFORE YOU LEAVE: -IT exercises -labs -update pap and flu vaccines in health maintenance -follow up: 1 month  We have ordered labs or studies at this visit. It can take up to 1-2 weeks for results and processing. IF results require follow up or explanation, we will call you with instructions. Clinically stable results will be released to your [facility]. If you have not heard from [place] or cannot find your results in [facility] in 2 weeks please contact our office at [phone].  If you are not yet signed up for [facility], please consider signing up.  Do the exercise 4 days per week.  Ice twice daily.  Topical menthol as needed for pain (tiger balm is a good product)  I hope you are feeling better soon! Seek care sooner if your symptoms worsen, new concerns arise or you are not improving with treatment.           Lucretia Kern, DO

## 2018-07-11 ENCOUNTER — Ambulatory Visit (INDEPENDENT_AMBULATORY_CARE_PROVIDER_SITE_OTHER): Payer: BLUE CROSS/BLUE SHIELD | Admitting: Family Medicine

## 2018-07-11 ENCOUNTER — Encounter: Payer: Self-pay | Admitting: Family Medicine

## 2018-07-11 VITALS — BP 110/80 | HR 60 | Temp 97.6°F | Ht 62.0 in | Wt 139.7 lb

## 2018-07-11 DIAGNOSIS — M25552 Pain in left hip: Secondary | ICD-10-CM | POA: Diagnosis not present

## 2018-07-11 DIAGNOSIS — M9903 Segmental and somatic dysfunction of lumbar region: Secondary | ICD-10-CM | POA: Diagnosis not present

## 2018-07-11 DIAGNOSIS — R5383 Other fatigue: Secondary | ICD-10-CM

## 2018-07-11 DIAGNOSIS — Z8659 Personal history of other mental and behavioral disorders: Secondary | ICD-10-CM

## 2018-07-11 DIAGNOSIS — M9905 Segmental and somatic dysfunction of pelvic region: Secondary | ICD-10-CM

## 2018-07-11 DIAGNOSIS — M9906 Segmental and somatic dysfunction of lower extremity: Secondary | ICD-10-CM

## 2018-07-11 LAB — BASIC METABOLIC PANEL
BUN: 17 mg/dL (ref 6–23)
CO2: 31 mEq/L (ref 19–32)
Calcium: 9.4 mg/dL (ref 8.4–10.5)
Chloride: 103 mEq/L (ref 96–112)
Creatinine, Ser: 0.81 mg/dL (ref 0.40–1.20)
GFR: 84.1 mL/min (ref >60.00)
Glucose, Bld: 76 mg/dL (ref 70–99)
Potassium: 3.9 mEq/L (ref 3.5–5.1)
Sodium: 139 mEq/L (ref 135–145)

## 2018-07-11 LAB — TSH: TSH: 1.77 u[IU]/mL (ref 0.35–4.50)

## 2018-07-11 LAB — CBC
HCT: 41.7 % (ref 36.0–46.0)
Hemoglobin: 14.3 g/dL (ref 12.0–15.0)
MCHC: 34.3 g/dL (ref 30.0–36.0)
MCV: 90.1 fl (ref 78.0–100.0)
Platelets: 189 10*3/uL (ref 150.0–400.0)
RBC: 4.64 Mil/uL (ref 3.87–5.11)
RDW: 12.7 % (ref 11.5–15.5)
WBC: 5.9 10*3/uL (ref 4.0–10.5)

## 2018-07-11 LAB — FERRITIN: Ferritin: 15.4 ng/mL (ref 10.0–291.0)

## 2018-07-11 LAB — VITAMIN B12: Vitamin B-12: 518 pg/mL (ref 211–911)

## 2018-07-11 LAB — HEMOGLOBIN A1C: Hgb A1c MFr Bld: 5.1 % (ref 4.6–6.5)

## 2018-07-11 NOTE — Patient Instructions (Signed)
BEFORE YOU LEAVE: -IT exercises -labs -update pap and flu vaccines in health maintenance -follow up: 1 month  We have ordered labs or studies at this visit. It can take up to 1-2 weeks for results and processing. IF results require follow up or explanation, we will call you with instructions. Clinically stable results will be released to your [facility]. If you have not heard from [place] or cannot find your results in [facility] in 2 weeks please contact our office at [phone].  If you are not yet signed up for [facility], please consider signing up.  Do the exercise 4 days per week.  Ice twice daily.  Topical menthol as needed for pain (tiger balm is a good product)  I hope you are feeling better soon! Seek care sooner if your symptoms worsen, new concerns arise or you are not improving with treatment.

## 2018-07-19 ENCOUNTER — Ambulatory Visit (INDEPENDENT_AMBULATORY_CARE_PROVIDER_SITE_OTHER): Payer: BLUE CROSS/BLUE SHIELD | Admitting: Physician Assistant

## 2018-07-21 ENCOUNTER — Ambulatory Visit (INDEPENDENT_AMBULATORY_CARE_PROVIDER_SITE_OTHER): Payer: BLUE CROSS/BLUE SHIELD | Admitting: Orthopedic Surgery

## 2018-07-21 ENCOUNTER — Ambulatory Visit (INDEPENDENT_AMBULATORY_CARE_PROVIDER_SITE_OTHER): Payer: Self-pay

## 2018-07-21 ENCOUNTER — Encounter (INDEPENDENT_AMBULATORY_CARE_PROVIDER_SITE_OTHER): Payer: Self-pay | Admitting: Physician Assistant

## 2018-07-21 VITALS — Ht 62.0 in | Wt 139.7 lb

## 2018-07-21 DIAGNOSIS — M7062 Trochanteric bursitis, left hip: Secondary | ICD-10-CM

## 2018-07-21 DIAGNOSIS — M25552 Pain in left hip: Secondary | ICD-10-CM | POA: Diagnosis not present

## 2018-07-22 ENCOUNTER — Encounter (INDEPENDENT_AMBULATORY_CARE_PROVIDER_SITE_OTHER): Payer: Self-pay | Admitting: Orthopedic Surgery

## 2018-07-22 DIAGNOSIS — M7062 Trochanteric bursitis, left hip: Secondary | ICD-10-CM | POA: Diagnosis not present

## 2018-07-22 MED ORDER — LIDOCAINE HCL 1 % IJ SOLN
5.0000 mL | INTRAMUSCULAR | Status: AC | PRN
  Administered 2018-07-22: 5 mL

## 2018-07-22 MED ORDER — METHYLPREDNISOLONE ACETATE 40 MG/ML IJ SUSP
40.0000 mg | INTRAMUSCULAR | Status: AC | PRN
  Administered 2018-07-22: 40 mg via INTRA_ARTICULAR

## 2018-07-22 NOTE — Progress Notes (Signed)
Office Visit Note   Patient: Alison [name]           Date of Birth: [birth year]           MRN: [id number] Visit Date: 07/21/2018              Requested by: Terressa Koyanagi, DO [address] [place], [place] [address] PCP: Terressa Koyanagi, DO  Chief Complaint  Patient presents with  . Left Hip - Pain      HPI: Patient is a 37 year old woman who presents complaining of greater trochanter bursitis left hip.  Patient states that the left hip greater trochanter bursal pain started about 7 months ago after having her baby.  She states the last 2 months of been worse with daily pain.  Patient has difficulty standing lying and sleeping.  Patient states that dull nagging pain for over 10 denies any injuries.  Patient denies any groin pain denies any back pain denies any radicular symptoms.  Assessment & Plan: Visit Diagnoses:  1. Pain of left hip joint   2. Trochanteric bursitis, left hip     Plan: The greater trochanter bursa was injected with 1 cc Depo-Medrol 2 cc of quarter percent Marcaine plain she tolerated this well.  Follow-Up Instructions: Return if symptoms worsen or fail to improve.   Ortho Exam  Patient is alert, oriented, no adenopathy, well-dressed, normal affect, normal respiratory effort. Examination patient has a normal gait she has no pain with internal or external rotation of her hips.  She has a negative straight leg raise there is no focal motor weakness she is point tender to palpation over the greater trochanteric bursa  Imaging: No results found. No images are attached to the encounter.  Labs: Lab Results  Component Value Date   HGBA1C 5.1 07/11/2018   ESRSEDRATE 10 07/29/2010   LABURIC 4.3 12/03/2017   LABURIC 4.7 12/26/2011   LABORGA NO GROWTH 12/31/2010     Lab Results  Component Value Date   ALBUMIN 3.1 (L) 12/03/2017   ALBUMIN 4.1 03/13/2014   ALBUMIN 3.7 08/23/2013   LABURIC 4.3 12/03/2017   LABURIC 4.7 12/26/2011    Body mass index  is 25.55 kg/m.  Orders:  Orders Placed This Encounter  Procedures  . Large Joint Inj  . XR HIP UNILAT W OR W/O PELVIS 2-3 VIEWS LEFT   No orders of the defined types were placed in this encounter.    Procedures: Large Joint Inj: L greater trochanter on 07/22/2018 3:00 PM Indications: pain and diagnostic evaluation Details: 22 G 1.5 in needle, lateral approach  Arthrogram: No  Medications: 5 mL lidocaine 1 %; 40 mg methylPREDNISolone acetate 40 MG/ML Outcome: tolerated well, no immediate complications Procedure, treatment alternatives, risks and benefits explained, specific risks discussed. Consent was given by the patient. Immediately prior to procedure a time out was called to verify the correct patient, procedure, equipment, support staff and site/side marked as required. Patient was prepped and draped in the usual sterile fashion.      Clinical Data: No additional findings.  ROS:  All other systems negative, except as noted in the HPI. Review of Systems  Objective: Vital Signs: Ht 5\' 2"  (1.575 m)   Wt 139 lb 11.2 oz (63.4 kg)   BMI 25.55 kg/m   Specialty Comments:  No specialty comments available.  PMFS History: Patient Active Problem List   Diagnosis Date Noted  . Gestational hypertension 12/03/2017  . Postpartum care following vaginal delivery 12/03/2017  .  Benign gestational thrombocytopenia (HCC) 12/03/2017  . Obstetrical laceration - bilateral perilabial splays 12/03/2017  . Hypertension 12/20/2016  . Other fatigue 12/20/2016  . Precordial chest pain 12/20/2016  . Seasonal allergies 12/28/2014  . Anxiety and depression 12/28/2014  . SVD (spontaneous vaginal delivery) 3/8 12/26/2011   Past Medical History:  Diagnosis Date  . Anxiety and depression   . Cancer ([facility]) 03/2014   basal carcinoma  . Gestational hypertension 11/2011  . Nephrolithiasis   . Seasonal allergies     Family History  Problem Relation Age of Onset  . Arthritis Mother   .  Ankylosing spondylitis Mother   . Arthritis Father   . Other Father        car accident  . Arthritis Maternal Grandmother        Rheumatoid Arthritis  . Breast cancer Maternal Grandmother   . Diabetes Other   . Hypertension Other        grandparent  . Stroke Other        grandparent    Past Surgical History:  Procedure Laterality Date  . MOHS SURGERY  03/2014   BCC on nose.   Social History   Occupational History    Comment: prn  . Occupation:    Tobacco Use  . Smoking status: Never Smoker  . Smokeless tobacco: Never Used  . Tobacco comment: Married, lives with spouse. 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 Alcohol use: Yes    Comment: 1 drink every two weeks  . Drug use: No  . Sexual activity: Yes

## 2019-01-29 IMAGING — DX DG CHEST 2V
2 series · 2 of 2 positions shown · non-contrast
Comparison: None.

CLINICAL DATA: Left anterior chest pain.  Hypertension.

EXAM:
CHEST  2 VIEW

[chest pa]
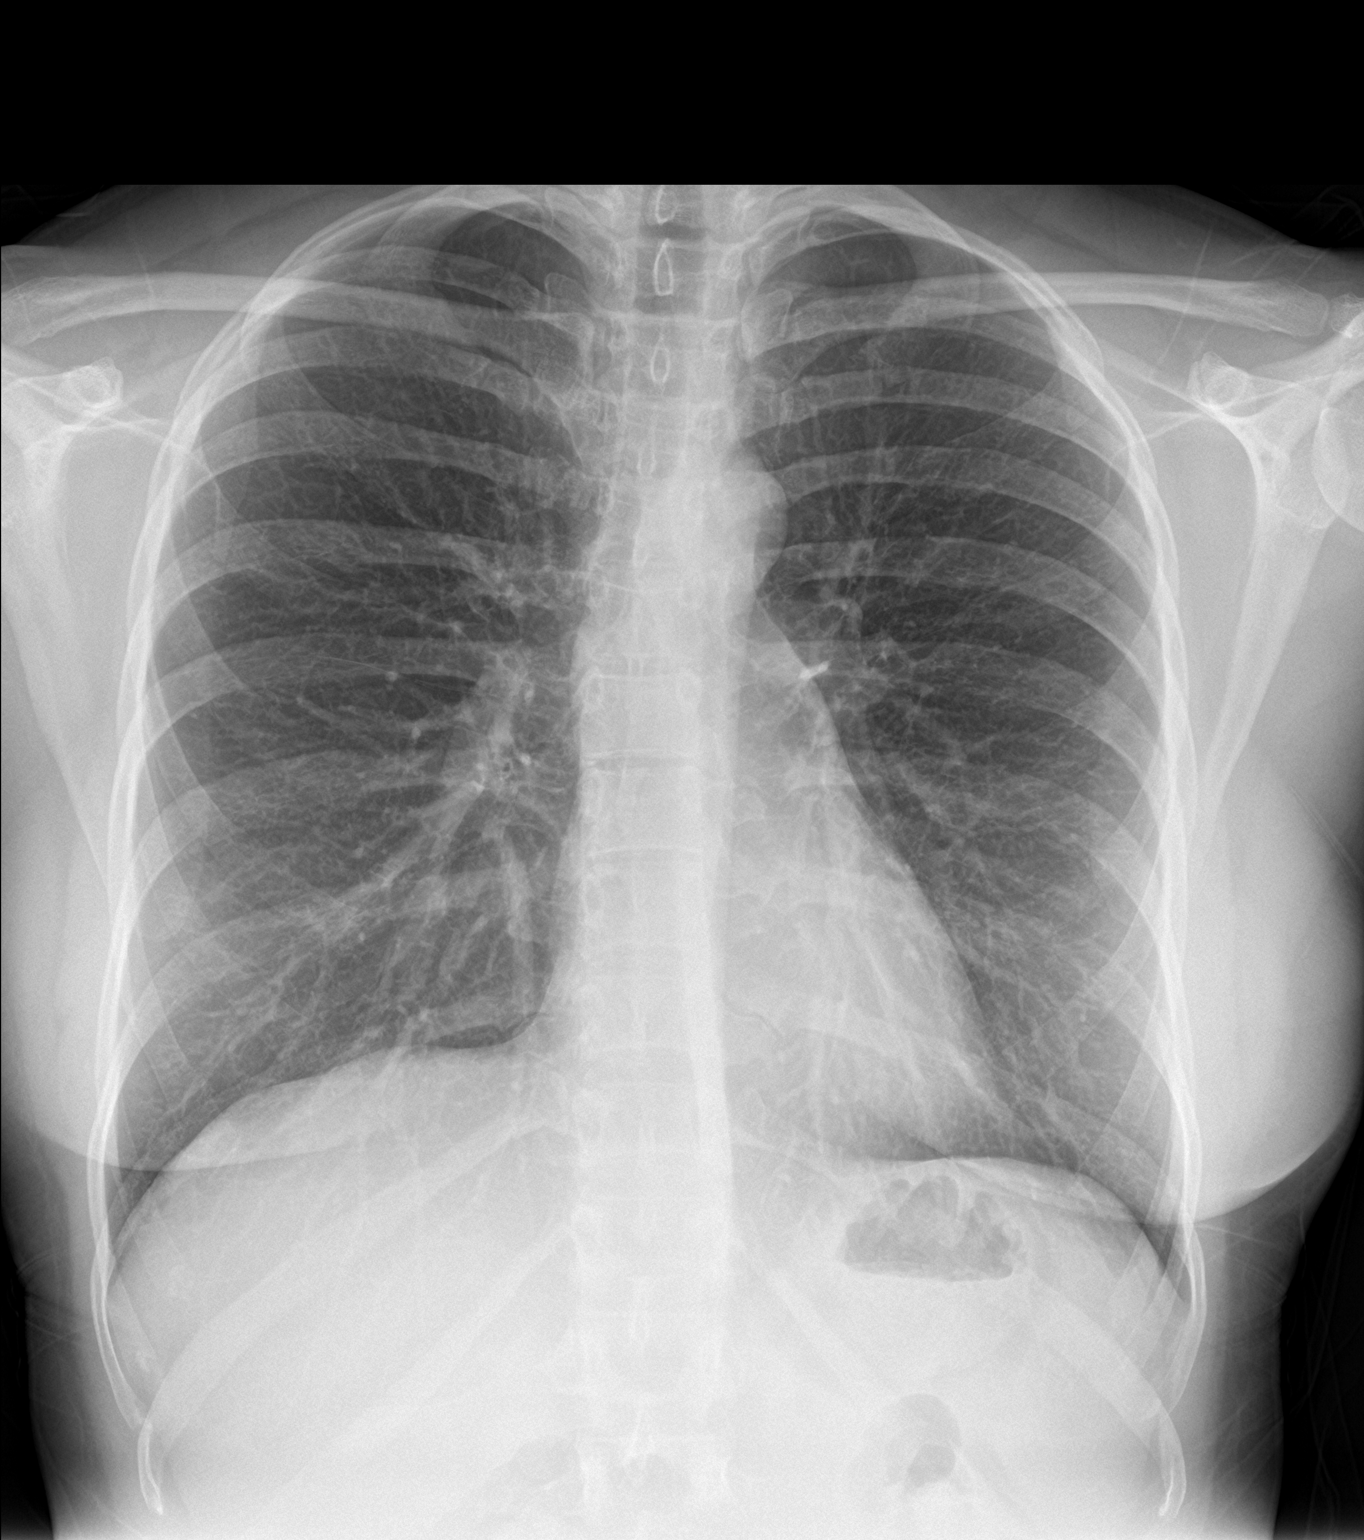

[chest lat]
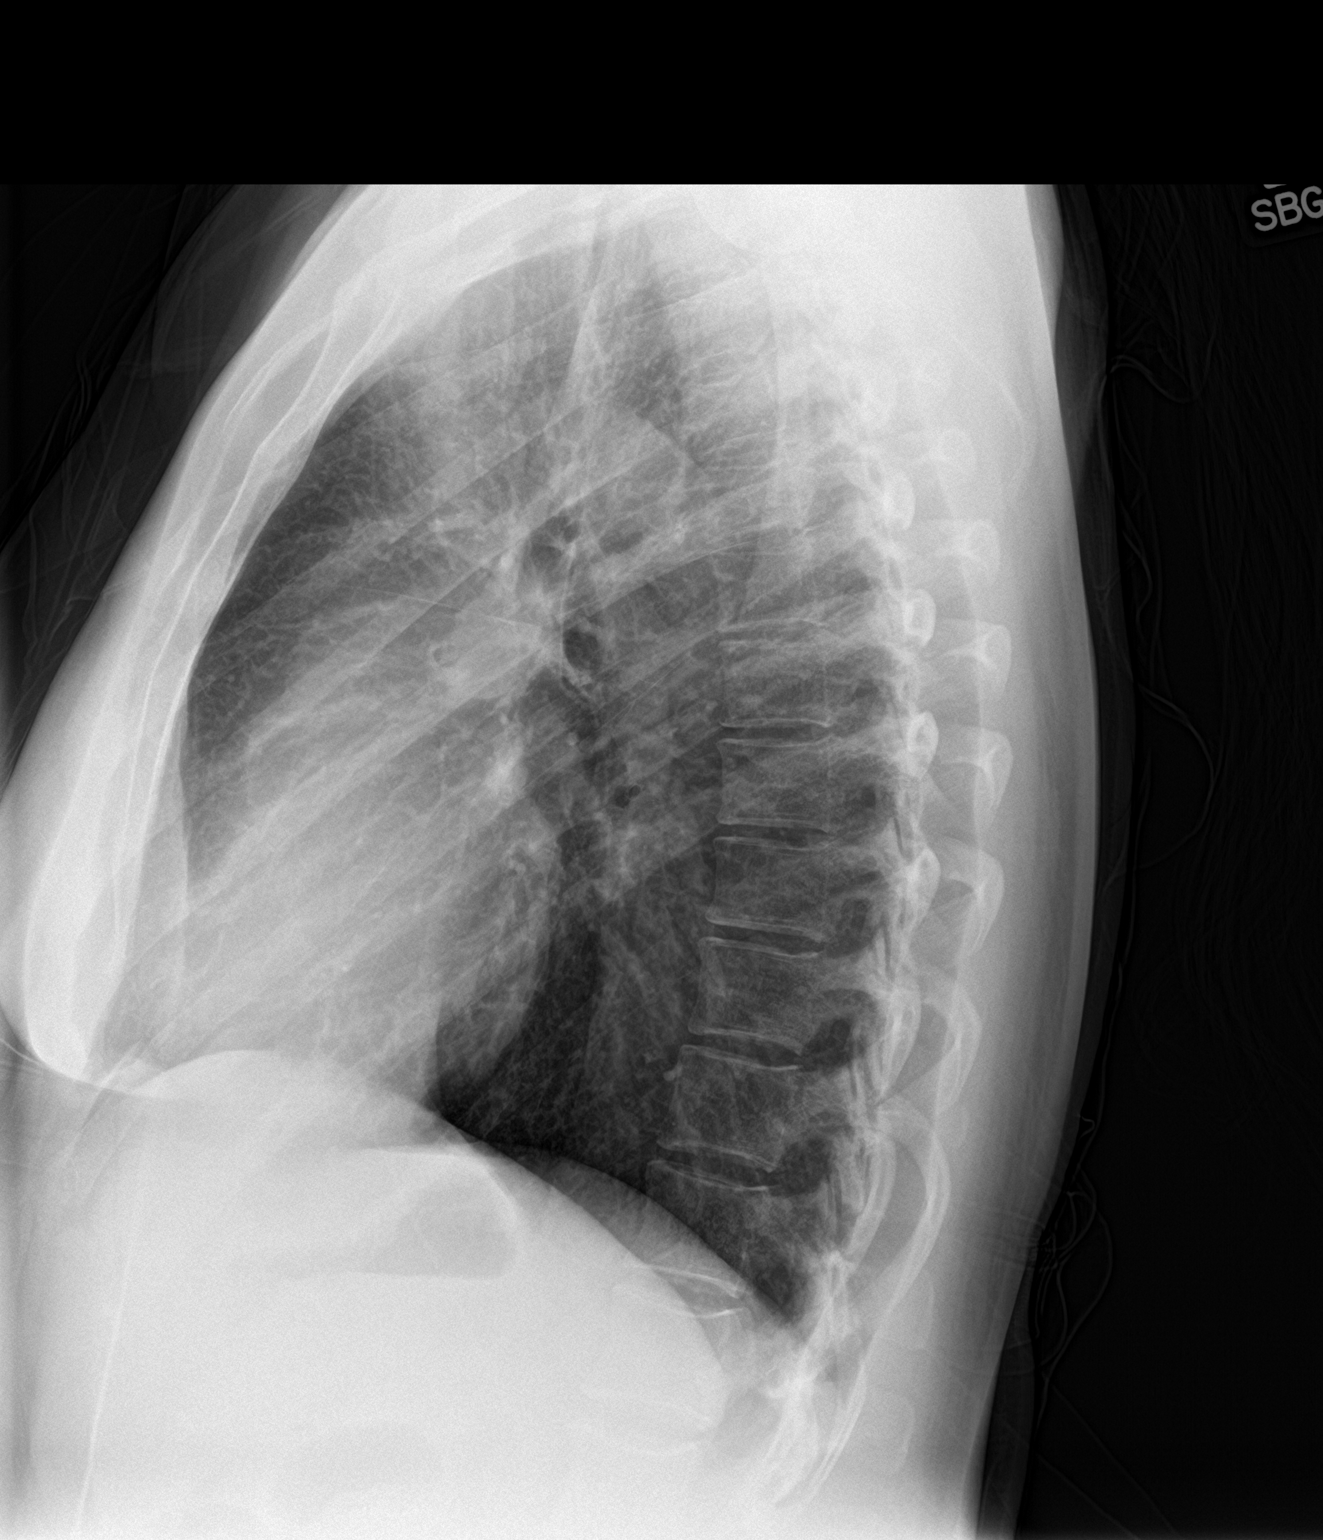

[2 of 2 positions shown; findings below may reference images not displayed]

FINDINGS: The heart size and mediastinal contours are within normal limits.
Both lungs are clear. The visualized skeletal structures are
unremarkable.
IMPRESSION: No active cardiopulmonary disease.

## 2019-03-02 ENCOUNTER — Encounter: Payer: Self-pay | Admitting: Family Medicine

## 2019-03-02 ENCOUNTER — Other Ambulatory Visit: Payer: Self-pay

## 2019-03-02 ENCOUNTER — Ambulatory Visit (INDEPENDENT_AMBULATORY_CARE_PROVIDER_SITE_OTHER): Payer: 59 | Admitting: Family Medicine

## 2019-03-02 DIAGNOSIS — M7062 Trochanteric bursitis, left hip: Secondary | ICD-10-CM

## 2019-03-02 DIAGNOSIS — M25552 Pain in left hip: Secondary | ICD-10-CM

## 2019-03-02 DIAGNOSIS — M5442 Lumbago with sciatica, left side: Secondary | ICD-10-CM

## 2019-03-02 NOTE — Progress Notes (Signed)
Office Visit Note   Patient: Alison [name]           Date of Birth: Oct 18, 1980           MRN: [id number] Visit Date: 03/02/2019 Requested by: Terressa Koyanagi, DO [address] [place], [place] [address] PCP: Terressa Koyanagi, DO  Subjective: Chief Complaint  Patient presents with  . Left Hip - Pain    Saw Dr. Lajoyce Corners in October of 2019 and received a cortisone injection - not much relief with this. Pain has just been "nagging" since. Posterior buttock pain, radiating down posterior thigh ("burning").  . Lower Back - Pain    HPI: She is here with persistent left posterior hip pain.  Trochanteric bursa injection in October did not give much relief.  Pain is mostly in the posterior hip with radiation toward the knee at times.  Hurts to walk, sometimes hurts at rest.  Has not noticed any numbness or weakness.              ROS: No fevers or chills.  All other systems were reviewed and are negative.  Objective: Vital Signs: There were no vitals taken for this visit.  Physical Exam:  General:  Alert and oriented, in no acute distress. Pulm:  Breathing unlabored. Psy:  Normal mood, congruent affect. Skin: No rash. Left hip: Negative straight leg raise, lower extremity strength and reflexes are normal.  No pain with internal hip rotation.  Piriformis stretch is equivocal, she very tender to palpation along the location of the piriformis.  Imaging: None today.  Assessment & Plan: 1.  Chronic left posterior hip pain, question lumbar disc protrusion versus piriformis syndrome. -Lumbar MRI to look for disc protrusion.  If negative, then physical therapy focused on piriformis syndrome.     Procedures: No procedures performed  No notes on file     PMFS History: Patient Active Problem List   Diagnosis Date Noted  . Gestational hypertension 12/03/2017  . Postpartum care following vaginal delivery 12/03/2017  . Benign gestational thrombocytopenia (HCC) 12/03/2017  . Obstetrical  laceration - bilateral perilabial splays 12/03/2017  . Hypertension 12/20/2016  . Other fatigue 12/20/2016  . Precordial chest pain 12/20/2016  . Seasonal allergies 12/28/2014  . Anxiety and depression 12/28/2014  . SVD (spontaneous vaginal delivery) 3/8 12/26/2011   Past Medical History:  Diagnosis Date  . Anxiety and depression   . Cancer (HCC) 03/2014   basal carcinoma  . Gestational hypertension 11/2011  . Nephrolithiasis   . Seasonal allergies     Family History  Problem Relation Age of Onset  . Arthritis Mother   . Ankylosing spondylitis Mother   . Arthritis Father   . Other Father        car accident  . Arthritis Maternal Grandmother        Rheumatoid Arthritis  . Breast cancer Maternal Grandmother   . Diabetes Other   . Hypertension Other        grandparent  . Stroke Other        grandparent    Past Surgical History:  Procedure Laterality Date  . MOHS SURGERY  03/2014   BCC on nose.   Social History   Occupational History    Comment: prn  . Occupation:    Tobacco Use  . Smoking status: Never Smoker  . Smokeless tobacco: Never Used  . Tobacco comment: Married, lives with spouse. 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 Alcohol use: Yes    Comment: 1 drink every two weeks  . Drug use: No  . Sexual activity: Yes

## 2019-03-27 ENCOUNTER — Other Ambulatory Visit: Payer: Self-pay

## 2019-03-27 ENCOUNTER — Ambulatory Visit
Admission: RE | Admit: 2019-03-27 | Discharge: 2019-03-27 | Disposition: A | Discharge status: Home / Self Care | Payer: 59 | Source: Ambulatory Visit | Attending: Family Medicine | Admitting: Family Medicine

## 2019-03-27 DIAGNOSIS — M5442 Lumbago with sciatica, left side: Secondary | ICD-10-CM

## 2019-03-28 ENCOUNTER — Telehealth: Payer: Self-pay | Admitting: Family Medicine

## 2019-03-28 DIAGNOSIS — M25552 Pain in left hip: Secondary | ICD-10-CM

## 2019-03-28 DIAGNOSIS — M5442 Lumbago with sciatica, left side: Secondary | ICD-10-CM

## 2019-03-28 NOTE — Addendum Note (Signed)
Addended by: Hortencia Pilar on: 03/28/2019 10:37 AM   Modules accepted: Orders

## 2019-03-28 NOTE — Telephone Encounter (Signed)
MRI looks good, no sign of nerve impingement.

## 2019-05-18 IMAGING — NM NM HEPATO W/GB/PHARM/[PERSON_NAME]
2 series · 12 of 12 positions shown · non-contrast
Comparison: None.

CLINICAL DATA: Right upper quadrant pain

EXAM:
NUCLEAR MEDICINE HEPATOBILIARY IMAGING WITH GALLBLADDER EF
TECHNIQUE: Sequential images of the abdomen were obtained [DATE] minutes
following intravenous administration of radiopharmaceutical. After
oral ingestion of Ensure, gallbladder ejection fraction was
determined. At 60 min, normal ejection fraction is greater than 33%.
RADIOPHARMACEUTICALS:  5.3 mCi Ic-11m  Choletec IV

[he hepatobiliary · 3.43mm/px · 6 of 60 frames shown (1 of 2)]
[frame 6/60]
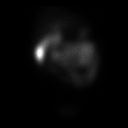
[frame 16/60]
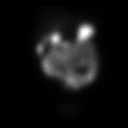
[frame 26/60]
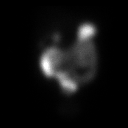
[frame 36/60]
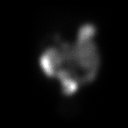
[frame 46/60]
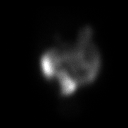
[frame 56/60]
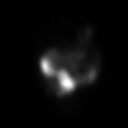

[he hepatobiliary · 3.43mm/px · 6 of 43 frames shown (2 of 2)]
[frame 4/43]
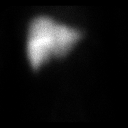
[frame 11/43]
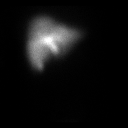
[frame 18/43]
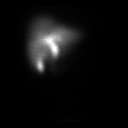
[frame 25/43]
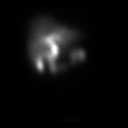
[frame 32/43]
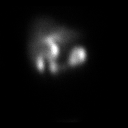
[frame 40/43]
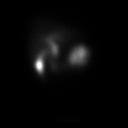

[12 of 12 positions shown; findings below may reference images not displayed]

FINDINGS: Prompt uptake and biliary excretion of activity by the liver is
seen. Gallbladder activity is visualized, consistent with patency of
cystic duct. Biliary activity passes into small bowel, consistent
with patent common bile duct.

Calculated gallbladder ejection fraction is 97%. (Normal gallbladder
ejection fraction with Ensure is greater than 33%.)
IMPRESSION: Normal uptake and excretion of biliary tracer.

Normal gallbladder ejection fraction.

## 2019-08-28 ENCOUNTER — Other Ambulatory Visit: Payer: Self-pay

## 2019-08-29 ENCOUNTER — Encounter: Payer: Self-pay | Admitting: Family Medicine

## 2019-08-29 ENCOUNTER — Ambulatory Visit (INDEPENDENT_AMBULATORY_CARE_PROVIDER_SITE_OTHER): Payer: 59 | Admitting: Family Medicine

## 2019-08-29 ENCOUNTER — Other Ambulatory Visit: Payer: Self-pay

## 2019-08-29 VITALS — BP 128/80 | HR 84 | Temp 98.1°F | Ht 62.0 in | Wt 148.6 lb

## 2019-08-29 DIAGNOSIS — I10 Essential (primary) hypertension: Secondary | ICD-10-CM

## 2019-08-29 DIAGNOSIS — L659 Nonscarring hair loss, unspecified: Secondary | ICD-10-CM

## 2019-08-29 LAB — TSH: TSH: 1.13 u[IU]/mL (ref 0.35–4.50)

## 2019-08-29 NOTE — Patient Instructions (Signed)
Monitor blood pressure and be in touch if consisently > 140/90.

## 2019-08-29 NOTE — Progress Notes (Signed)
Subjective:     Patient ID: Alison [name], female   DOB: Dec 06, 1980, 38 y.o.   MRN: [id number]  HPI Alison [name] is here with concerns about elevated blood pressure.  She has had previous elevated blood pressure during pregnancy and briefly was on a couple of medications including chlorthalidone and at one point HCTZ and losartan.  She has been off those medications for quite some time.  She works at the [employer] and she has a monitor at home and had some recent readings as high as 135/90.  She is not any recent headaches or chest pains.  No dizziness.  Walks about 25 minutes/day.  She is staying very busy raising 46 and 37-year-old children.  Non-smoker.  No regular alcohol use.  No recent nonsteroidal use.  Second issue is she has had some mild alopecia recently mostly frontal region.  Not aware of any family history of this.  She had thyroid functions about a year ago that were normal.  She would like to get her TSH rechecked.  No other overt symptoms of hypothyroidism  Past Medical History:  Diagnosis Date  . Anxiety and depression   . Cancer (HCC) 03/2014   basal carcinoma  . Gestational hypertension 11/2011  . Nephrolithiasis   . Seasonal allergies    Past Surgical History:  Procedure Laterality Date  . MOHS SURGERY  03/2014   BCC on nose.    reports that she has never smoked. She has never used smokeless tobacco. She reports current alcohol use. She reports that she does not use drugs. family history includes Ankylosing spondylitis in her mother; Arthritis in her father, maternal grandmother, and mother; Breast cancer in her maternal grandmother; Diabetes in an other family member; Hypertension in an other family member; Other in her father; Stroke in an other family member. No Known Allergies   Review of Systems  Respiratory: Negative for shortness of breath.   Cardiovascular: Negative for chest pain.  Neurological: Negative for dizziness and headaches.       Objective:   Physical Exam Vitals signs reviewed.  Constitutional:      Appearance: Normal appearance.  Cardiovascular:     Rate and Rhythm: Normal rate and regular rhythm.  Pulmonary:     Effort: Pulmonary effort is normal.     Breath sounds: Normal breath sounds.  Musculoskeletal:     Right lower leg: No edema.     Left lower leg: No edema.  Skin:    Comments: She does not have any major thinning of hair on exam at this time  Neurological:     Mental Status: She is alert.        Assessment:     #1 elevated blood pressure.  Readings today left arm seated rest with medium size cuff 122/80.  She does recall taking some magnesium earlier today and wonders if that may have helped.  #2 mild alopecia    Plan:     -Recheck TSH -Monitor blood pressure for now and be in touch if consistent readings greater than 140/90 -Discussed nonpharmacologic management with sodium reduction, regular exercise, weight control  Kristian Covey MD [facility]

## 2019-09-13 ENCOUNTER — Encounter: Payer: Self-pay | Admitting: *Deleted

## 2019-11-07 ENCOUNTER — Encounter: Payer: Self-pay | Admitting: Family Medicine

## 2019-11-07 ENCOUNTER — Telehealth (INDEPENDENT_AMBULATORY_CARE_PROVIDER_SITE_OTHER): Payer: No Typology Code available for payment source | Admitting: Family Medicine

## 2019-11-07 ENCOUNTER — Other Ambulatory Visit: Payer: Self-pay

## 2019-11-07 VITALS — BP 130/96 | HR 65

## 2019-11-07 DIAGNOSIS — R519 Headache, unspecified: Secondary | ICD-10-CM | POA: Diagnosis not present

## 2019-11-07 DIAGNOSIS — I10 Essential (primary) hypertension: Secondary | ICD-10-CM

## 2019-11-07 DIAGNOSIS — R635 Abnormal weight gain: Secondary | ICD-10-CM | POA: Diagnosis not present

## 2019-11-07 MED ORDER — LOSARTAN POTASSIUM 50 MG PO TABS: 50.0000 mg | ORAL_TABLET | Freq: Every day | ORAL | 0 refills | Status: DC

## 2019-11-07 NOTE — Patient Instructions (Signed)
-  please schedule lab visit in next 1 week  -after labs can start the losartan 50mg  daily  -advise a healthy low sugar/whole foods based diet and at least 150 minutes of aerobic exercise per week  -continue care with your psychiatrist for anxiety and depression  -follow up in 3-4 weeks  I hope you are feeling better soon! Seek care promptly if your symptoms worsen, new concerns arise or you are not improving with treatment.

## 2019-11-07 NOTE — Progress Notes (Signed)
Virtual Visit via Video Note  I connected with Alison [name] on 11/07/19 at  1:20 PM EST by a video enabled telemedicine application and verified that I am speaking with the correct person using two identifiers.  Location patient: home Location provider:work or home office Persons participating in the virtual visit: patient, provider  I discussed the limitations of evaluation and management by telemedicine and the availability of in person appointments. The patient expressed understanding and agreed to proceed.   HPI:  Acute visit for Elevated BP: -started noticing elevated BP on home cuff about 3 months ago after increased Effexor dose for anxiety and depression - she thought this was from the effexor dose, but has maintained since decreasing the medicine. She feels the effexor works the best for her psych symptoms. Psych advised seeing PCP about this. -has a history of HTN with pregnancy, also on random occassions and with OCPs in the past. Has been on medications for hypertension in the past and saw cardiologist for evaluation. Felt diuretics didn't work well. She thinks losartan may have been the most helpful in the past -she has not been exercising as much and has gained some weight this past year, did not weigh herself, but thinks weight is around 150 lbs -she did a visit in the office with Dr. Elease Hashimoto about 2 months ago - BP was ok at office visit, but up otherwise. She has checked it at work and at home. -BP 140s/90s on average -BP today 130/102 -some headaches, she has had headaches in the past, but a little more frequent with the BP being up -denies: CP, SOB, vision changes, palpitations -seeing a doctor for anx/dep, no counseling now, doing ok -she has a FH of Hypertension, brother is 33 and takes BP medication -rare alcohol use -caffeine 2 drinks per day -no tobacco -no medication allergies -denies any chance of pregnancy or desire for pregnancy   ROS: See pertinent positives and  negatives per HPI.  Past Medical History:  Diagnosis Date  . Anxiety and depression   . Cancer ([place]) 03/2014   basal carcinoma  . Gestational hypertension 11/2011  . Nephrolithiasis   . Seasonal allergies     Past Surgical History:  Procedure Laterality Date  . MOHS SURGERY  03/2014   BCC on nose.    Family History  Problem Relation Age of Onset  . Arthritis Mother   . Ankylosing spondylitis Mother   . Arthritis Father   . Other Father        car accident  . Arthritis Maternal Grandmother        Rheumatoid Arthritis  . Breast cancer Maternal Grandmother   . Diabetes Other   . Hypertension Other        grandparent  . Stroke Other        grandparent    SOCIAL HX: see hpi   Current Outpatient Medications:  .  Cholecalciferol (VITAMIN D PO), Take by mouth., Disp: , Rfl:  .  Multiple Vitamin (MULTIVITAMIN) capsule, Take 1 capsule by mouth daily., Disp: , Rfl:  .  Omega-3 Fatty Acids (FISH OIL PO), Take by mouth., Disp: , Rfl:  .  venlafaxine XR (EFFEXOR-XR) 75 MG 24 hr capsule, Take 75 mg by mouth daily., Disp: , Rfl:  .  losartan (COZAAR) 50 MG tablet, Take 1 tablet (50 mg total) by mouth daily., Disp: 90 tablet, Rfl: 0  EXAM:  VITALS per patient if applicable: see hpi  GENERAL: alert, oriented, appears well and in no acute  distress  HEENT: atraumatic, conjunttiva clear, no obvious abnormalities on inspection of external nose and ears  NECK: normal movements of the head and neck  LUNGS: on inspection no signs of respiratory distress, breathing rate appears normal, no obvious gross SOB, gasping or wheezing  CV: no obvious cyanosis  MS: moves all visible extremities without noticeable abnormality  PSYCH/NEURO: pleasant and cooperative, no obvious depression or anxiety, speech and thought processing grossly intact  ASSESSMENT AND PLAN:  Discussed the following assessment and plan:  Hypertension, unspecified type - Plan: Basic metabolic panel, CBC  Frequent  headaches  Weight gain  -we discussed possible serious and likely etiologies, options for evaluation and workup, limitations of telemedicine visit vs in person visit, treatment, treatment risks and precautions. Pt prefers to treat via telemedicine empirically rather then risking or undertaking an in person visit at this moment. Opted for labs - sent message to staff to assist, start losartan 50 mg pending labs, lifestyle interventions for BP and wt reduction and close follow up in 3-4 weeks. Further eval of headaches if not resolving with BP treatment or any worsening or other symptoms. Patient agrees to seek prompt in person care if worsening, new symptoms arise, or if is not improving with treatment.  Pt needs in house PCP. Advised of options. She prefers to see Dr. Ethlyn Gallery, can use telemed visit with me as well if she wishes. Sent message to scheduling to assist.   I discussed the assessment and treatment plan with the patient. The patient was provided an opportunity to ask questions and all were answered. The patient agreed with the plan and demonstrated an understanding of the instructions.   The patient was advised to call back or seek an in-person evaluation if the symptoms worsen or if the condition fails to improve as anticipated.   Lucretia Kern, DO   Patient Instructions  -please schedule lab visit in next 1 week  -after labs can start the losartan 50mg  daily  -advise a healthy low sugar/whole foods based diet and at least 150 minutes of aerobic exercise per week  -continue care with your psychiatrist for anxiety and depression  -follow up in 3-4 weeks  I hope you are feeling better soon! Seek care promptly if your symptoms worsen, new concerns arise or you are not improving with treatment.

## 2019-11-08 ENCOUNTER — Other Ambulatory Visit (INDEPENDENT_AMBULATORY_CARE_PROVIDER_SITE_OTHER): Payer: No Typology Code available for payment source

## 2019-11-08 ENCOUNTER — Encounter: Payer: Self-pay | Admitting: Family Medicine

## 2019-11-08 DIAGNOSIS — I10 Essential (primary) hypertension: Secondary | ICD-10-CM

## 2019-11-09 LAB — BASIC METABOLIC PANEL
BUN: 16 mg/dL (ref 6–23)
CO2: 30 mEq/L (ref 19–32)
Calcium: 9.2 mg/dL (ref 8.4–10.5)
Chloride: 104 mEq/L (ref 96–112)
Creatinine, Ser: 0.98 mg/dL (ref 0.40–1.20)
GFR: 63.07 mL/min (ref >60.00)
Glucose, Bld: 73 mg/dL (ref 70–99)
Potassium: 3.8 mEq/L (ref 3.5–5.1)
Sodium: 141 mEq/L (ref 135–145)

## 2019-11-09 LAB — CBC
HCT: 40 % (ref 36.0–46.0)
Hemoglobin: 13.5 g/dL (ref 12.0–15.0)
MCHC: 33.9 g/dL (ref 30.0–36.0)
MCV: 87 fl (ref 78.0–100.0)
Platelets: 206 10*3/uL (ref 150.0–400.0)
RBC: 4.6 Mil/uL (ref 3.87–5.11)
RDW: 13.5 % (ref 11.5–15.5)
WBC: 6.1 10*3/uL (ref 4.0–10.5)

## 2019-11-09 MED ORDER — CHLORTHALIDONE 25 MG PO TABS: 25.0000 mg | ORAL_TABLET | Freq: Every day | ORAL | 0 refills | Status: DC

## 2019-11-09 NOTE — Telephone Encounter (Signed)
[place],  Please let her know the labs look good. Let her know got sent to her PCP first, so sorry for the delay. Please let her know ok to do chlorthalidone 25mg  daily if she prefers. #90. No refills. Please send rx. Ensure she has follow up with me or PCP in a few weeks. Thanks.

## 2019-11-28 ENCOUNTER — Telehealth (INDEPENDENT_AMBULATORY_CARE_PROVIDER_SITE_OTHER): Payer: 59 | Admitting: Family Medicine

## 2019-11-28 ENCOUNTER — Encounter: Payer: Self-pay | Admitting: Family Medicine

## 2019-11-28 VITALS — BP 120/75 | HR 70

## 2019-11-28 DIAGNOSIS — I10 Essential (primary) hypertension: Secondary | ICD-10-CM | POA: Diagnosis not present

## 2019-11-28 DIAGNOSIS — R635 Abnormal weight gain: Secondary | ICD-10-CM

## 2019-11-28 DIAGNOSIS — R519 Headache, unspecified: Secondary | ICD-10-CM

## 2019-11-28 NOTE — Progress Notes (Signed)
Virtual Visit via Video Note  I connected with Alison [name]  on 11/28/19 at  1:00 PM EST by a video enabled telemedicine application and verified that I am speaking with the correct person using two identifiers.  Location patient: home Location provider:work or home office Persons participating in the virtual visit: patient, provider  I discussed the limitations of evaluation and management by telemedicine and the availability of in person appointments. The patient expressed understanding and agreed to proceed.   HPI:  Alison [name] is seen for follow up HTN today: -she reports she started the chlorthalidone which brought BP down by about 10 points, but was still high and she had some palpitations which she reports she has had in the past with diuretics -she then started losartan too and now BP is in the 120s/70s and she feels much better, headaches have resolved, palpitations resolved -plans to work on a healthy lifestyle as well -now no cp, sob, ha, palpitations  ROS: See pertinent positives and negatives per HPI.  Past Medical History:  Diagnosis Date  . Anxiety and depression   . Cancer ([place]) 03/2014   basal carcinoma  . Gestational hypertension 11/2011  . Nephrolithiasis   . Seasonal allergies     Past Surgical History:  Procedure Laterality Date  . MOHS SURGERY  03/2014   BCC on nose.    Family History  Problem Relation Age of Onset  . Arthritis Mother   . Ankylosing spondylitis Mother   . Arthritis Father   . Other Father        car accident  . Arthritis Maternal Grandmother        Rheumatoid Arthritis  . Breast cancer Maternal Grandmother   . Diabetes Other   . Hypertension Other        grandparent  . Stroke Other        grandparent    SOCIAL HX: see hpi   Current Outpatient Medications:  .  chlorthalidone (HYGROTON) 25 MG tablet, Take 1 tablet (25 mg total) by mouth daily., Disp: 90 tablet, Rfl: 0 .  Cholecalciferol (VITAMIN D PO), Take by mouth., Disp: , Rfl:  .   losartan (COZAAR) 50 MG tablet, Take 1 tablet (50 mg total) by mouth daily., Disp: 90 tablet, Rfl: 0 .  Multiple Vitamin (MULTIVITAMIN) capsule, Take 1 capsule by mouth daily., Disp: , Rfl:  .  Omega-3 Fatty Acids (FISH OIL PO), Take by mouth., Disp: , Rfl:  .  venlafaxine XR (EFFEXOR-XR) 75 MG 24 hr capsule, Take 75 mg by mouth daily., Disp: , Rfl:   EXAM:  VITALS per patient if applicable: Vitals:   [id number]  BP: 120/75  Pulse: 70     GENERAL: alert, oriented, appears well and in no acute distress  HEENT: atraumatic, conjunttiva clear, no obvious abnormalities on inspection of external nose and ears  NECK: normal movements of the head and neck  LUNGS: on inspection no signs of respiratory distress, breathing rate appears normal, no obvious gross SOB, gasping or wheezing  CV: no obvious cyanosis  MS: moves all visible extremities without noticeable abnormality  PSYCH/NEURO: pleasant and cooperative, no obvious depression or anxiety, speech and thought processing grossly intact  ASSESSMENT AND PLAN:  Discussed the following assessment and plan:  Essential hypertension  Frequent headaches  Weight gain  -glad BP is improved and is about at goal -she plans to work on a healthy lifestyle -headaches are resolved, may have been related to hypertension -continue chlorthalidone and losartan -check BMP -follow  up with me in 6 months; will have visit in 2 months with Dr. Ethlyn [name] to meet her so has inperson PCP, but she prefers to follow virtually with me when able.  I discussed the assessment and treatment plan with the patient. The patient was provided an opportunity to ask questions and all were answered. The patient agreed with the plan and demonstrated an understanding of the instructions.   The patient was advised to call back or seek an in-person evaluation if the symptoms worsen or if the condition fails to improve as anticipated.   Lucretia Kern, DO

## 2019-11-28 NOTE — Patient Instructions (Signed)
Continue the Chlorthalidone and the Losartan.  Get a lab visit in the next 1-2 weeks.  Follow up as scheduled in May and in 6 months.  We recommend the following healthy lifestyle for LIFE: 1) Small portions. But, make sure to get regular (at least 3 per day), healthy meals and small healthy snacks if needed.  2) Eat a healthy clean diet.   TRY TO EAT: -at least 5-7 servings of low sugar, colorful, and nutrient rich vegetables per day (not corn, potatoes or bananas.) -berries are the best choice if you wish to eat fruit (only eat small amounts if trying to reduce weight)  -lean meets (fish, white meat of chicken or [place]) -vegan proteins for some meals - beans or tofu, whole grains, nuts and seeds -Replace bad fats with good fats - good fats include: fish, nuts and seeds, canola oil, olive oil -small amounts of low fat or non fat dairy -small amounts of100 % whole grains - check the lables -drink plenty of water  AVOID: -SUGAR, sweets, anything with added sugar, corn syrup or sweeteners - must read labels as even foods advertised as "healthy" often are loaded with sugar -if you must have a sweetener, small amounts of stevia may be best -sweetened beverages and artificially sweetened beverages -simple starches (rice, bread, potatoes, pasta, chips, etc - small amounts of 100% whole grains are ok) -red meat, pork, butter -fried foods, fast food, processed food, excessive dairy, eggs and coconut.  3)Get at least 150 minutes of sweaty aerobic exercise per week.  4)Reduce stress - consider counseling, meditation and relaxation to balance other aspects of your life.

## 2019-12-14 ENCOUNTER — Other Ambulatory Visit: Payer: 59

## 2019-12-19 ENCOUNTER — Other Ambulatory Visit: Payer: 59

## 2019-12-21 ENCOUNTER — Other Ambulatory Visit (INDEPENDENT_AMBULATORY_CARE_PROVIDER_SITE_OTHER): Payer: 59

## 2019-12-21 DIAGNOSIS — I10 Essential (primary) hypertension: Secondary | ICD-10-CM | POA: Diagnosis not present

## 2019-12-21 LAB — BASIC METABOLIC PANEL
BUN: 20 mg/dL (ref 6–23)
CO2: 30 mEq/L (ref 19–32)
Calcium: 9 mg/dL (ref 8.4–10.5)
Chloride: 99 mEq/L (ref 96–112)
Creatinine, Ser: 0.86 mg/dL (ref 0.40–1.20)
GFR: 73.29 mL/min (ref >60.00)
Glucose, Bld: 83 mg/dL (ref 70–99)
Potassium: 3.2 mEq/L — ABNORMAL LOW (ref 3.5–5.1)
Sodium: 136 mEq/L (ref 135–145)

## 2020-01-15 ENCOUNTER — Encounter: Payer: Self-pay | Admitting: Family Medicine

## 2020-01-15 DIAGNOSIS — M533 Sacrococcygeal disorders, not elsewhere classified: Secondary | ICD-10-CM

## 2020-01-16 ENCOUNTER — Encounter: Payer: Self-pay | Admitting: Family Medicine

## 2020-01-16 DIAGNOSIS — M533 Sacrococcygeal disorders, not elsewhere classified: Secondary | ICD-10-CM

## 2020-01-16 DIAGNOSIS — Z1589 Genetic susceptibility to other disease: Secondary | ICD-10-CM

## 2020-01-16 DIAGNOSIS — M25552 Pain in left hip: Secondary | ICD-10-CM

## 2020-01-16 DIAGNOSIS — M5442 Lumbago with sciatica, left side: Secondary | ICD-10-CM

## 2020-01-31 ENCOUNTER — Other Ambulatory Visit: Payer: Self-pay | Admitting: Family Medicine

## 2020-02-02 ENCOUNTER — Ambulatory Visit (INDEPENDENT_AMBULATORY_CARE_PROVIDER_SITE_OTHER): Payer: 59 | Admitting: Physical Medicine and Rehabilitation

## 2020-02-02 ENCOUNTER — Other Ambulatory Visit: Payer: Self-pay

## 2020-02-02 ENCOUNTER — Ambulatory Visit: Payer: Self-pay

## 2020-02-02 ENCOUNTER — Encounter: Payer: Self-pay | Admitting: Physical Medicine and Rehabilitation

## 2020-02-02 DIAGNOSIS — M461 Sacroiliitis, not elsewhere classified: Secondary | ICD-10-CM | POA: Diagnosis not present

## 2020-02-02 MED ORDER — BUPIVACAINE HCL 0.5 % IJ SOLN
2.0000 mL | INTRAMUSCULAR | Status: AC | PRN
  Administered 2020-02-02: 2 mL via INTRA_ARTICULAR

## 2020-02-02 MED ORDER — METHYLPREDNISOLONE ACETATE 80 MG/ML IJ SUSP
80.0000 mg | INTRAMUSCULAR | Status: AC | PRN
  Administered 2020-02-02: 80 mg via INTRA_ARTICULAR

## 2020-02-02 NOTE — Progress Notes (Signed)
Pt states pain in the right side of the lower back that radiates into the right buttocks. Pt states pain started 2 months ago. Last injection 10/20/16 helped out a lot. Pt states sitting and laying down makes pain worse. Moving around makes pain better.   .Numeric Pain Rating Scale and Functional Assessment Average Pain 4   In the last MONTH (on 0-10 scale) has pain interfered with the following?  1. General activity like being  able to carry out your everyday physical activities such as walking, climbing stairs, carrying groceries, or moving a chair?  Rating(4)    -Dye Allergies.

## 2020-02-02 NOTE — Progress Notes (Signed)
Alison [name] - 39 y.o. female MRN [id number]  Date of birth: Jun 27, 1981  Office Visit Note: Visit Date: 02/02/2020 PCP: Terressa Koyanagi, DO Referred by: Terressa Koyanagi, DO  Subjective: Chief Complaint  Patient presents with  . Lower Back - Pain   HPI:  Alison [name] is a 39 y.o. female who comes in today For planned essentially repeat right sacroiliac joint injection fluoroscopic guidance.  Patient had prior injection 2018 and did extremely well with good pain relief even through her pregnancy.  She has had history of left-sided piriformis syndrome that was improved with physical therapy and exercise.  She is having pain in the right lower back upper buttock region over the PSIS.  She has a positive Fortin finger sign.  Good strength on exam.  We will repeat the injection today diagnostically and hopefully therapeutically.  ROS Otherwise per HPI.  Assessment & Plan: Visit Diagnoses:  1. Sacroiliitis (HCC)     Plan: No additional findings.   Meds & Orders: No orders of the defined types were placed in this encounter.   Orders Placed This Encounter  Procedures  . XR C-ARM NO REPORT    Follow-up: Return for visit to requesting physician as needed.   Procedures: Sacroiliac Joint Inj (Right) on 02/02/2020 8:41 AM Indications: pain and diagnostic evaluation Details: 22 G 3.5 in needle, fluoroscopy-guided posterior approach Medications: 2 mL bupivacaine 0.5 %; 80 mg methylPREDNISolone acetate 80 MG/ML Outcome: tolerated well, no immediate complications  There was excellent flow of contrast producing a partial arthrogram of the sacroiliac joint.  Procedure, treatment alternatives, risks and benefits explained, specific risks discussed. Consent was given by the patient. Immediately prior to procedure a time out was called to verify the correct patient, procedure, equipment, support staff and site/side marked as required. Patient was prepped and draped in the usual sterile fashion.        No notes on file   Clinical History: MRI LUMBAR SPINE WITHOUT CONTRAST  TECHNIQUE: Multiplanar, multisequence MR imaging of the lumbar spine was performed. No intravenous contrast was administered.  COMPARISON:  Prior radiograph from 10/01/2016.  FINDINGS: Segmentation: Standard. Lowest well-formed disc labeled the L5-S1 level.  Alignment: Mild levoscoliosis. Alignment otherwise normal with preservation of the normal lumbar lordosis. No listhesis or subluxation.  Vertebrae: Vertebral body height maintained without evidence for acute or chronic fracture. Bone marrow signal intensity within normal limits. No discrete or worrisome osseous lesions. No abnormal marrow edema.  Conus medullaris and cauda equina: Conus extends to the L1 level. Conus and cauda equina appear normal.  Paraspinal and other soft tissues: Paraspinous soft tissues within normal limits. Visualized visceral structures unremarkable. Retroaortic left renal vein noted.  Disc levels:  No significant disc pathology seen within the lumbar spine. Intervertebral discs are well hydrated with preserved disc height. No disc bulge or of disc protrusion. No significant facet degeneration. No canal or neural foraminal stenosis. No impingement.  IMPRESSION: 1. Mild levoscoliosis. 2. Otherwise unremarkable and normal MRI of the lumbar spine.   Electronically Signed   By: Rise Mu M.D.   On: 03/27/2019 22:02     Objective:  VS:  HT:    WT:   BMI:     BP:   HR: bpm  TEMP: ( )  RESP:  Physical Exam   Imaging: No results found.

## 2020-02-14 ENCOUNTER — Other Ambulatory Visit: Payer: Self-pay | Admitting: Family Medicine

## 2020-02-16 ENCOUNTER — Other Ambulatory Visit: Payer: Self-pay

## 2020-02-16 ENCOUNTER — Ambulatory Visit (INDEPENDENT_AMBULATORY_CARE_PROVIDER_SITE_OTHER): Payer: 59 | Admitting: Family Medicine

## 2020-02-16 ENCOUNTER — Encounter: Payer: Self-pay | Admitting: Family Medicine

## 2020-02-16 VITALS — BP 110/82 | HR 83 | Temp 97.9°F | Ht 62.0 in | Wt 151.4 lb

## 2020-02-16 DIAGNOSIS — I10 Essential (primary) hypertension: Secondary | ICD-10-CM | POA: Diagnosis not present

## 2020-02-16 DIAGNOSIS — R5383 Other fatigue: Secondary | ICD-10-CM

## 2020-02-16 DIAGNOSIS — F329 Major depressive disorder, single episode, unspecified: Secondary | ICD-10-CM

## 2020-02-16 DIAGNOSIS — F419 Anxiety disorder, unspecified: Secondary | ICD-10-CM

## 2020-02-16 DIAGNOSIS — F32A Depression, unspecified: Secondary | ICD-10-CM

## 2020-02-16 MED ORDER — LOSARTAN POTASSIUM 50 MG PO TABS: 50.0000 mg | ORAL_TABLET | Freq: Every day | ORAL | 1 refills | Status: DC

## 2020-02-16 MED ORDER — VENLAFAXINE HCL ER 150 MG PO CP24: 150.0000 mg | ORAL_CAPSULE | Freq: Every day | ORAL | 1 refills | Status: DC

## 2020-02-16 NOTE — Progress Notes (Signed)
Alison [name] DOB: 10-28-1980 Encounter date: 02/16/2020  This is a 39 y.o. female who presents to establish care. Chief Complaint  Patient presents with  . Establish Care    History of present illness:  HTN:  Losartan (stopped chlorthalidone due to hypokalemia) - has been a little all over the place - highest she has seen was 130/90 but usually lower. (feels it spiked after cortisone shot in SI)  Anxiety/depression: would like effexor refilled. She is taking 150mg  once daily. NO side effects with this. Anxiety and depression are still present but feel well controlled on the effexor.   Seasonal allergies: have been bad this year. Uses otc claritin if bad. Uses eye drops. Had allergy shots about 5 years ago which helped.   Sleeps well, but always feels tired. Has started snoring since second pregnancy.    Past Medical History:  Diagnosis Date  . Anxiety and depression   . Cancer (HCC) 03/2014   basal carcinoma  . Gestational hypertension 11/2011  . Nephrolithiasis   . Seasonal allergies    Past Surgical History:  Procedure Laterality Date  . MOHS SURGERY  03/2014   BCC on nose.   No Known Allergies Current Meds  Medication Sig  . Cholecalciferol (VITAMIN D PO) Take by mouth.  . losartan (COZAAR) 50 MG tablet Take 1 tablet (50 mg total) by mouth daily.  . Multiple Vitamin (MULTIVITAMIN) capsule Take 1 capsule by mouth daily.  . Omega-3 Fatty Acids (FISH OIL PO) Take by mouth.  . [DISCONTINUED] losartan (COZAAR) 50 MG tablet TAKE 1 TABLET BY MOUTH EVERY DAY  . [DISCONTINUED] venlafaxine XR (EFFEXOR-XR) 75 MG 24 hr capsule Take 75 mg by mouth daily.   Social History   Tobacco Use  . Smoking status: Never Smoker  . Smokeless tobacco: Never Used  . Tobacco comment: Married, lives with spouse. 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 Alcohol use: Yes    Comment: 1 drink every two weeks   Family  History  Problem Relation Age of Onset  . Arthritis Mother   . Ankylosing spondylitis Mother   . Arthritis Father   . Other Father        car accident  . Arthritis Maternal Grandmother        Rheumatoid Arthritis  . Breast cancer Maternal Grandmother 60  . Diabetes Other   . Hypertension Other        grandparent  . Stroke Other        grandparent  . Anxiety disorder Brother   . Hypertension Brother   . Healthy Half-Brother   . Healthy Half-Sister      Review of Systems  Constitutional: Positive for fatigue. Negative for chills and fever.  Respiratory: Negative for cough, chest tightness, shortness of breath and wheezing.   Cardiovascular: Negative for chest pain, palpitations and leg swelling.  Musculoskeletal: Negative for arthralgias and back pain.  Psychiatric/Behavioral: Negative for sleep disturbance. The patient is nervous/anxious (but stable).     Objective:  BP 110/82 (BP Location: Left Arm, Patient Position: Sitting, Cuff Size: Normal)   Pulse 83   Temp 97.9 F (36.6 C) (Temporal)   Ht 5\' 2"  (1.575 m)   Wt 151 lb 6.4 oz (68.7 kg)   LMP 02/01/2020 (Exact Date)   BMI 27.69 kg/m   Weight: 151 lb 6.4 oz (68.7 kg)   BP Readings from Last 3 Encounters:  02/16/20 110/82  11/28/19  120/75  11/07/19 (!) 130/96   Wt Readings from Last 3 Encounters:  02/16/20 151 lb 6.4 oz (68.7 kg)  08/29/19 148 lb 9.6 oz (67.4 kg)  07/21/18 139 lb 11.2 oz (63.4 kg)    Physical Exam Constitutional:      General: She is not in acute distress.    Appearance: She is well-developed.  Cardiovascular:     Rate and Rhythm: Normal rate and regular rhythm.     Heart sounds: Normal heart sounds. No murmur. No friction rub.  Pulmonary:     Effort: Pulmonary effort is normal. No respiratory distress.     Breath sounds: Normal breath sounds. No wheezing or rales.  Musculoskeletal:     Right lower leg: No edema.     Left lower leg: No edema.  Neurological:     Mental Status: She is  alert and oriented to person, place, and time.  Psychiatric:        Behavior: Behavior normal.     Assessment/Plan:  1. Essential hypertension Stable on losartan alone.  We discussed additional blood work, but she prefers to wait.  She does have a follow-up appointment with rheumatology in August and I expect we will get a metabolic panel at that time. - losartan (COZAAR) 50 MG tablet; Take 1 tablet (50 mg total) by mouth daily.  Dispense: 90 tablet; Refill: 1  2. Fatigue, unspecified type This is an ongoing issue.  I did offer to order some additional blood work for evaluation of this today.  She will be seeing gynecology in the summer for a physical as well as rheumatology (as stated above).  She states she can ask them for blood work if desired.  She knows she can also call back and ask for blood work through [place] if desired.  She mentions snoring, but prefers to see a specialist first and then if no cause for fatigue found, can consider sleep evaluation.  3. Anxiety and depression Stable on current medication.  This is refilled for her. - venlafaxine XR (EFFEXOR XR) 150 MG 24 hr capsule; Take 1 capsule (150 mg total) by mouth daily with breakfast.  Dispense: 90 capsule; Refill: 1  Return in about 6 months (around 08/18/2020) for physical exam.  Theodis Shove, MD

## 2020-02-16 NOTE — Patient Instructions (Signed)
Consider taking 2,000 units of vitamin D daily

## 2020-04-17 NOTE — Progress Notes (Signed)
Office Visit Note  Patient: Alison [name]             Date of Birth: [birth year]           MRN: [id number]             PCP: Wynn Banker, MD Referring: Lavada Mesi, MD Visit Date: 04/30/2020 Occupation: @GUAROCC @  Subjective:  New Patient (Initial Visit) (Abnormal labs)   History of Present Illness: Alison [name] is a 39 y.o. female she was initially seen by me in July 2016.  At that time she was experiencing lower back pain.  Lower back pain symptoms a started approximately 2010.  The pain was gradually getting worse at the time.  She had neck and lower back pain.  She also gives history of eye inflammation when her ophthalmologist diagnosed to be conjunctival hemorrhage.  Her mother has HLA-B27 and she had spine issues.  At the time Alison [name]'s HLA-B27 came positive.  She had x-rays of her cervical and lumbar spine which were remarkable for facet joint arthropathy.  No synovitis was noted on the ultrasound examination of her hands.  She states since then she is done some dietary modifications and has been on gluten-free diet.  Recently she was experiencing some left-sided hip pain for which she was seen by Dr. Prince Rome he diagnosed with with piriformis syndrome and referred her to physical therapy.  To improve to some extent.  Recently she started experiencing SI joint pain on the right side for which she had a cortisone injection by Dr. Alvester Morin which was helpful.  She states she has had off-and-on discomfort in her right shoulder which causes nocturnal pain.  She denies any history of psoriasis, inflammatory bowel disease, plantar fasciitis or Achilles tendinitis.  None of the other family members have HLA-B27 related arthropathy.  Activities of Daily Living:  Patient reports morning stiffness for 15 minutes.   Patient Reports nocturnal pain.  Difficulty dressing/grooming: Denies Difficulty climbing stairs: Denies Difficulty getting out of chair: Denies Difficulty using hands for taps,  buttons, cutlery, and/or writing: Denies  Review of Systems  Constitutional: Positive for fatigue. Negative for night sweats, weight gain and weight loss.  HENT: Negative for mouth sores, trouble swallowing, trouble swallowing, mouth dryness and nose dryness.   Eyes: Positive for dryness. Negative for pain, redness and visual disturbance.  Respiratory: Negative for cough, shortness of breath and difficulty breathing.   Cardiovascular: Negative for chest pain, palpitations, hypertension, irregular heartbeat and swelling in legs/feet.  Gastrointestinal: Negative for blood in stool, constipation and diarrhea.  Endocrine: Negative for excessive thirst and increased urination.  Genitourinary: Negative for difficulty urinating and vaginal dryness.  Musculoskeletal: Positive for arthralgias, joint pain, morning stiffness and muscle tenderness. Negative for joint swelling, myalgias, muscle weakness and myalgias.  Skin: Negative for color change, rash, hair loss, skin tightness, ulcers and sensitivity to sunlight.  Allergic/Immunologic: Negative for susceptible to infections.  Neurological: Negative for dizziness, numbness, memory loss, night sweats and weakness.  Hematological: Negative for bruising/bleeding tendency and swollen glands.  Psychiatric/Behavioral: Negative for depressed mood and sleep disturbance. The patient is not nervous/anxious.     PMFS History:  Patient Active Problem List   Diagnosis Date Noted  . Gestational hypertension 12/03/2017  . Postpartum care following vaginal delivery 12/03/2017  . Benign gestational thrombocytopenia (HCC) 12/03/2017  . Obstetrical laceration - bilateral perilabial splays 12/03/2017  . Hypertension 12/20/2016  . Other fatigue 12/20/2016  . Precordial chest pain 12/20/2016  . Seasonal  allergies 12/28/2014  . Anxiety and depression 12/28/2014  . SVD (spontaneous vaginal delivery) 3/8 12/26/2011    Past Medical History:  Diagnosis Date  .  Anxiety and depression   . Cancer (HCC) 03/2014   basal carcinoma  . Gestational hypertension 11/2011  . Nephrolithiasis   . Seasonal allergies     Family History  Problem Relation Age of Onset  . Arthritis Mother   . Ankylosing spondylitis Mother   . Arthritis Father   . Other Father        car accident  . Arthritis Maternal Grandmother        Rheumatoid Arthritis  . Breast cancer Maternal Grandmother 60  . Diabetes Other   . Hypertension Other        grandparent  . Stroke Other        grandparent  . Anxiety disorder Brother   . Hypertension Brother   . Healthy Half-Brother   . Healthy Half-Sister    Past Surgical History:  Procedure Laterality Date  . MOHS SURGERY  03/2014   BCC on nose.   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 Influenza Whole 06/28/2009  . Influenza-Unspecified 06/28/2016, 06/29/2019  . Moderna SARS-COVID-2 Vaccination 09/28/2019, 10/26/2019  . Td 09/29/2007  . Tdap 12/31/2010     Objective: Vital Signs: BP 108/77 (BP Location: Right Arm, Patient Position: Sitting, Cuff Size: Normal)   Pulse 92   Resp 14   Ht 5\' 2"  (1.575 m)   Wt 154 lb (69.9 kg)   BMI 28.17 kg/m    Physical Exam Vitals and nursing note reviewed.  Constitutional:      Appearance: She is well-developed.  HENT:     Head: Normocephalic and atraumatic.  Eyes:     Conjunctiva/sclera: Conjunctivae normal.  Cardiovascular:     Rate and Rhythm: Normal rate and regular rhythm.     Heart sounds: Normal heart sounds.  Pulmonary:     Effort: Pulmonary effort is normal.     Breath sounds: Normal breath sounds.  Abdominal:     General: Bowel sounds are normal.     Palpations: Abdomen is soft.   Musculoskeletal:     Cervical back: Normal range of motion.  Lymphadenopathy:     Cervical: No cervical adenopathy.  Skin:    General: Skin is warm and dry.     Capillary Refill: Capillary refill takes less than 2 seconds.  Neurological:     Mental Status: She is alert and oriented to person, place, and time.  Psychiatric:        Behavior: Behavior normal.      Musculoskeletal Exam: C-spine, thoracic and lumbar spine were in good range of motion.  She was able to reach her toes without difficulty.  Schober's was negative.  She had no SI joint tenderness on examination today.  She has some tenderness over piriformis bilaterally and bilateral trochanteric area.  Shoulder joints, elbow joints, wrist joints, MCPs PIPs and DIPs with good range of motion with no synovitis.  She had no nail pitting.  Hip joints, knee joints, ankles, MTPs and PIPs with good range of motion with no  synovitis.  There was no evidence of Achilles tendinitis or plantar fasciitis.  CDAI Exam: CDAI Score: -- Patient Global: --; Provider Global: -- Swollen: --; Tender: -- Joint Exam 04/30/2020   No joint exam has been documented for this visit   There is currently no information documented on the homunculus. Go to the Rheumatology activity and complete the homunculus joint exam.  Investigation: No additional findings.  Imaging: XR Pelvis 1-2 Views  Result Date: 04/30/2020 No SI joint sclerosis or narrowing was noted.  No hip joint narrowing was noted. Impression: Unremarkable x-ray of the SI joints.   Recent Labs: Lab Results  Component Value Date   WBC 6.1 11/08/2019   HGB 13.5 11/08/2019   PLT 206.0 11/08/2019   NA 136 12/21/2019   K 3.2 (L) 12/21/2019   CL 99 12/21/2019   CO2 30 12/21/2019   GLUCOSE 83 12/21/2019   BUN 20 12/21/2019   CREATININE 0.86 12/21/2019   BILITOT 0.9 12/03/2017   ALKPHOS 119 12/03/2017   AST 31 12/03/2017   ALT 35 12/03/2017   PROT 6.1 (L) 12/03/2017   ALBUMIN 3.1 (L)  12/03/2017   CALCIUM 9.0 12/21/2019   GFRAA >60 12/03/2017   IMPRESSION: 1. Mild levoscoliosis. 2. Otherwise unremarkable and normal MRI of the lumbar spine.   Electronically Signed   By: Rise Mu M.D.   On: 03/27/2019 22:02  Speciality Comments: No specialty comments available.  Procedures:  No procedures performed Allergies: Patient has no known allergies.   Assessment / Plan:     Visit Diagnoses: HLA B27 positive-Zita is HLA-B27 positive.  She was evaluated by me approximately 5 years ago at that time the work-up was completely negative.  According to patient her mother has spondylosis associated with HLA-B27 but does not have ankylosing spondylitis.  She gives history of arthralgias involving her right shoulder joint mostly her lower back and SI joints.  She recently had SI joint injection and had no discomfort currently.  She had good mobility.  I also reviewed x-ray and MRI of her lumbar spine which do not show any syndesmophytes.  Old records from 2016 were reviewed.  Sacroiliac joint dysfunction of right side -as she has had chronic SI joint discomfort I will obtain Emelda Fear view today.  Plan: XR Pelvis 1-2 Views.  X-ray findings were reviewed and discussed with the patient.  SI joint x-rays were unremarkable.  She will benefit from Pilates and core strengthening exercises.  Trochanteric bursitis of both hips-she had tenderness over bilateral trochanteric bursa.  IT band exercises were emphasized.  She has done yoga in the past which was helpful but what was causing a strain on her lower back.  She may benefit from core strengthening and Pilates.  Have given her a handout on IT band exercises.  Chronic left-sided low back pain with left-sided sciatica-the x-ray showed mild facet joint changes otherwise unremarkable.  A handout on lumbar spine exercises was given.  Right shoulder pain chronic-patient has intermittent discomfort in her right shoulder joint.  She  declined x-ray today.  She is not symptomatic today in the office.  Essential hypertension-blood pressure is normal.  Other fatigue  Anxiety and depression  Her mother is HLA-B27 positive and had a spondylosis per patient.r  Orders: Orders Placed This Encounter  Procedures  . XR Pelvis 1-2 Views   No orders of the defined types were placed in this encounter.     Follow-Up Instructions: Return if symptoms worsen or fail to improve, for SIJ  pain, [id number]+.   Pollyann Savoy, MD  Note - This record has been created using Animal nutritionist.  Chart creation errors have been sought, but may not always  have been located. Such creation errors do not reflect on  the standard of medical care.

## 2020-04-30 ENCOUNTER — Other Ambulatory Visit: Payer: Self-pay

## 2020-04-30 ENCOUNTER — Encounter: Payer: Self-pay | Admitting: Rheumatology

## 2020-04-30 ENCOUNTER — Ambulatory Visit (INDEPENDENT_AMBULATORY_CARE_PROVIDER_SITE_OTHER): Payer: No Typology Code available for payment source | Admitting: Rheumatology

## 2020-04-30 ENCOUNTER — Ambulatory Visit: Payer: Self-pay

## 2020-04-30 VITALS — BP 108/77 | HR 92 | Resp 14 | Ht 62.0 in | Wt 154.0 lb

## 2020-04-30 DIAGNOSIS — Z1589 Genetic susceptibility to other disease: Secondary | ICD-10-CM

## 2020-04-30 DIAGNOSIS — M533 Sacrococcygeal disorders, not elsewhere classified: Secondary | ICD-10-CM

## 2020-04-30 DIAGNOSIS — R5383 Other fatigue: Secondary | ICD-10-CM

## 2020-04-30 DIAGNOSIS — F329 Major depressive disorder, single episode, unspecified: Secondary | ICD-10-CM

## 2020-04-30 DIAGNOSIS — M5442 Lumbago with sciatica, left side: Secondary | ICD-10-CM | POA: Diagnosis not present

## 2020-04-30 DIAGNOSIS — M25511 Pain in right shoulder: Secondary | ICD-10-CM

## 2020-04-30 DIAGNOSIS — F419 Anxiety disorder, unspecified: Secondary | ICD-10-CM

## 2020-04-30 DIAGNOSIS — M7061 Trochanteric bursitis, right hip: Secondary | ICD-10-CM

## 2020-04-30 DIAGNOSIS — I10 Essential (primary) hypertension: Secondary | ICD-10-CM

## 2020-04-30 DIAGNOSIS — G8929 Other chronic pain: Secondary | ICD-10-CM

## 2020-04-30 DIAGNOSIS — F32A Depression, unspecified: Secondary | ICD-10-CM

## 2020-04-30 DIAGNOSIS — M7062 Trochanteric bursitis, left hip: Secondary | ICD-10-CM

## 2020-04-30 NOTE — Patient Instructions (Signed)
Iliotibial Band Syndrome Rehab Ask your health care provider which exercises are safe for you. Do exercises exactly as told by your health care provider and adjust them as directed. It is normal to feel mild stretching, pulling, tightness, or discomfort as you do these exercises. Stop right away if you feel sudden pain or your pain gets significantly worse. Do not begin these exercises until told by your health care provider. Stretching and range-of-motion exercises These exercises warm up your muscles and joints and improve the movement and flexibility of your hip and pelvis. Quadriceps stretch, prone  1. Lie on your abdomen on a firm surface, such as a bed or padded floor (prone position). 2. Bend your left / right knee and reach back to hold your ankle or pant leg. If you cannot reach your ankle or pant leg, loop a belt around your foot and grab the belt instead. 3. Gently pull your heel toward your buttocks. Your knee should not slide out to the side. You should feel a stretch in the front of your thigh and knee (quadriceps). 4. Hold this position for __________ seconds. Repeat __________ times. Complete this exercise __________ times a day. Iliotibial band stretch An iliotibial band is a strong band of muscle tissue that runs from the outer side of your hip to the outer side of your thigh and knee. 1. Lie on your side with your left / right leg in the top position. 2. Bend both of your knees and grab your left / right ankle. Stretch out your bottom arm to help you balance. 3. Slowly bring your top knee back so your thigh goes behind your trunk. 4. Slowly lower your top leg toward the floor until you feel a gentle stretch on the outside of your left / right hip and thigh. If you do not feel a stretch and your knee will not fall farther, place the heel of your other foot on top of your knee and pull your knee down toward the floor with your foot. 5. Hold this position for __________  seconds. Repeat __________ times. Complete this exercise __________ times a day. Strengthening exercises These exercises build strength and endurance in your hip and pelvis. Endurance is the ability to use your muscles for a long time, even after they get tired. Straight leg raises, side-lying This exercise strengthens the muscles that rotate the leg at the hip and move it away from your body (hip abductors). 1. Lie on your side with your left / right leg in the top position. Lie so your head, shoulder, hip, and knee line up. You may bend your bottom knee to help you balance. 2. Roll your hips slightly forward so your hips are stacked directly over each other and your left / right knee is facing forward. 3. Tense the muscles in your outer thigh and lift your top leg 4-6 inches (10-15 cm). 4. Hold this position for __________ seconds. 5. Slowly return to the starting position. Let your muscles relax completely before doing another repetition. Repeat __________ times. Complete this exercise __________ times a day. Leg raises, prone This exercise strengthens the muscles that move the hips (hip extensors). 1. Lie on your abdomen on your bed or a firm surface. You can put a pillow under your hips if that is more comfortable for your lower back. 2. Bend your left / right knee so your foot is straight up in the air. 3. Squeeze your buttocks muscles and lift your left / right thigh   off the bed. Do not let your back arch. 4. Tense your thigh muscle as hard as you can without increasing any knee pain. 5. Hold this position for __________ seconds. 6. Slowly lower your leg to the starting position and allow it to relax completely. Repeat __________ times. Complete this exercise __________ times a day. Hip hike 1. Stand sideways on a bottom step. Stand on your left / right leg with your other foot unsupported next to the step. You can hold on to the railing or wall for balance if needed. 2. Keep your knees  straight and your torso square. Then lift your left / right hip up toward the ceiling. 3. Slowly let your left / right hip lower toward the floor, past the starting position. Your foot should get closer to the floor. Do not lean or bend your knees. Repeat __________ times. Complete this exercise __________ times a day. This information is not intended to replace advice given to you by your health care provider. Make sure you discuss any questions you have with your health care provider. Document Revised: 01/05/2019 Document Reviewed: 07/06/2018 [employer] Patient Education  2020 [employer] Back Exercises The following exercises strengthen the muscles that help to support the trunk and back. They also help to keep the lower back flexible. Doing these exercises can help to prevent back pain or lessen existing pain.  If you have back pain or discomfort, try doing these exercises 2-3 times each day or as told by your health care provider.  As your pain improves, do them once each day, but increase the number of times that you repeat the steps for each exercise (do more repetitions).  To prevent the recurrence of back pain, continue to do these exercises once each day or as told by your health care provider. Do exercises exactly as told by your health care provider and adjust them as directed. It is normal to feel mild stretching, pulling, tightness, or discomfort as you do these exercises, but you should stop right away if you feel sudden pain or your pain gets worse. Exercises Single knee to chest Repeat these steps 3-5 times for each leg: 1. Lie on your back on a firm bed or the floor with your legs extended. 2. Bring one knee to your chest. Your other leg should stay extended and in contact with the floor. 3. Hold your knee in place by grabbing your knee or thigh with both hands and hold. 4. Pull on your knee until you feel a gentle stretch in your lower back or buttocks. 5. Hold the stretch  for 10-30 seconds. 6. Slowly release and straighten your leg. Pelvic tilt Repeat these steps 5-10 times: 1. Lie on your back on a firm bed or the floor with your legs extended. 2. Bend your knees so they are pointing toward the ceiling and your feet are flat on the floor. 3. Tighten your lower abdominal muscles to press your lower back against the floor. This motion will tilt your pelvis so your tailbone points up toward the ceiling instead of pointing to your feet or the floor. 4. With gentle tension and even breathing, hold this position for 5-10 seconds. Cat-cow Repeat these steps until your lower back becomes more flexible: 1. Get into a hands-and-knees position on a firm surface. Keep your hands under your shoulders, and keep your knees under your hips. You may place padding under your knees for comfort. 2. Let your head hang down toward your chest. Contract   your abdominal muscles and point your tailbone toward the floor so your lower back becomes rounded like the back of a cat. 3. Hold this position for 5 seconds. 4. Slowly lift your head, let your abdominal muscles relax and point your tailbone up toward the ceiling so your back forms a sagging arch like the back of a cow. 5. Hold this position for 5 seconds.  Press-ups Repeat these steps 5-10 times: 1. Lie on your abdomen (face-down) on the floor. 2. Place your palms near your head, about shoulder-width apart. 3. Keeping your back as relaxed as possible and keeping your hips on the floor, slowly straighten your arms to raise the top half of your body and lift your shoulders. Do not use your back muscles to raise your upper torso. You may adjust the placement of your hands to make yourself more comfortable. 4. Hold this position for 5 seconds while you keep your back relaxed. 5. Slowly return to lying flat on the floor.  Bridges Repeat these steps 10 times: 1. Lie on your back on a firm surface. 2. Bend your knees so they are  pointing toward the ceiling and your feet are flat on the floor. Your arms should be flat at your sides, next to your body. 3. Tighten your buttocks muscles and lift your buttocks off the floor until your waist is at almost the same height as your knees. You should feel the muscles working in your buttocks and the back of your thighs. If you do not feel these muscles, slide your feet 1-2 inches farther away from your buttocks. 4. Hold this position for 3-5 seconds. 5. Slowly lower your hips to the starting position, and allow your buttocks muscles to relax completely. If this exercise is too easy, try doing it with your arms crossed over your chest. Abdominal crunches Repeat these steps 5-10 times: 1. Lie on your back on a firm bed or the floor with your legs extended. 2. Bend your knees so they are pointing toward the ceiling and your feet are flat on the floor. 3. Cross your arms over your chest. 4. Tip your chin slightly toward your chest without bending your neck. 5. Tighten your abdominal muscles and slowly raise your trunk (torso) high enough to lift your shoulder blades a tiny bit off the floor. Avoid raising your torso higher than that because it can put too much stress on your low back and does not help to strengthen your abdominal muscles. 6. Slowly return to your starting position. Back lifts Repeat these steps 5-10 times: 1. Lie on your abdomen (face-down) with your arms at your sides, and rest your forehead on the floor. 2. Tighten the muscles in your legs and your buttocks. 3. Slowly lift your chest off the floor while you keep your hips pressed to the floor. Keep the back of your head in line with the curve in your back. Your eyes should be looking at the floor. 4. Hold this position for 3-5 seconds. 5. Slowly return to your starting position. Contact a health care provider if:  Your back pain or discomfort gets much worse when you do an exercise.  Your worsening back pain or  discomfort does not lessen within 2 hours after you exercise. If you have any of these problems, stop doing these exercises right away. Do not do them again unless your health care provider says that you can. Get help right away if:  You develop sudden, severe back pain. If this   happens, stop doing the exercises right away. Do not do them again unless your health care provider says that you can. This information is not intended to replace advice given to you by your health care provider. Make sure you discuss any questions you have with your health care provider. Document Revised: 01/19/2019 Document Reviewed: 06/16/2018 Elsevier Patient Education  2020 [employer]  

## 2020-05-16 ENCOUNTER — Other Ambulatory Visit: Payer: Self-pay | Admitting: Family Medicine

## 2020-05-16 DIAGNOSIS — I10 Essential (primary) hypertension: Secondary | ICD-10-CM

## 2020-05-18 ENCOUNTER — Other Ambulatory Visit: Payer: Self-pay | Admitting: Family Medicine

## 2020-05-29 ENCOUNTER — Ambulatory Visit: Payer: 59 | Admitting: Rheumatology

## 2020-06-04 ENCOUNTER — Telehealth (INDEPENDENT_AMBULATORY_CARE_PROVIDER_SITE_OTHER): Payer: No Typology Code available for payment source | Admitting: Family Medicine

## 2020-06-04 DIAGNOSIS — F329 Major depressive disorder, single episode, unspecified: Secondary | ICD-10-CM | POA: Diagnosis not present

## 2020-06-04 DIAGNOSIS — F419 Anxiety disorder, unspecified: Secondary | ICD-10-CM | POA: Diagnosis not present

## 2020-06-04 DIAGNOSIS — J302 Other seasonal allergic rhinitis: Secondary | ICD-10-CM

## 2020-06-04 DIAGNOSIS — F32A Depression, unspecified: Secondary | ICD-10-CM

## 2020-06-04 DIAGNOSIS — I10 Essential (primary) hypertension: Secondary | ICD-10-CM

## 2020-06-04 MED ORDER — VENLAFAXINE HCL ER 150 MG PO CP24: 150.0000 mg | ORAL_CAPSULE | Freq: Every day | ORAL | 1 refills | Status: DC

## 2020-06-04 MED ORDER — LOSARTAN POTASSIUM 50 MG PO TABS: 50.0000 mg | ORAL_TABLET | Freq: Every day | ORAL | 1 refills | Status: DC

## 2020-06-04 NOTE — Progress Notes (Signed)
Virtual Visit via Video Note  I connected with Alison [name]  on 06/04/20 at  1:00 PM EDT by a video enabled telemedicine application and verified that I am speaking with the correct person using two identifiers.  Location patient: home Location provider:work or home office Persons participating in the virtual visit: patient, provider  I discussed the limitations of evaluation and management by telemedicine and the availability of in person appointments. The patient expressed understanding and agreed to proceed.   HPI:  Telemedicine visit for follow up:  HTN: -BP 110/70 at home, checks a few time per week -tolerating the losartan alone, had been on chorthalidone but potassium was a little low and BP was lower -reports doing great and wants refills -wants to hold of on labs until next visit -denies cp, SOB, HA, swelling  Anxiety/Depression: -reports mood has been stable -taking effexor 150 mg daily -child just starting daycare this week and she thinks this may give her time to do some things for herself and exercise -denies depression, anxiety -does wish to continue medication  Seasonal Allergies: -worse in the spring and and fall -takes clarinex and nasal saline occ -was good during the summer but some issues since the end of summer/fall starting   ROS: See pertinent positives and negatives per HPI.  Past Medical History:  Diagnosis Date  . Anxiety and depression   . Cancer ([place]) 03/2014   basal carcinoma  . Gestational hypertension 11/2011  . Nephrolithiasis   . Seasonal allergies     Past Surgical History:  Procedure Laterality Date  . MOHS SURGERY  03/2014   BCC on nose.    Family History  Problem Relation Age of Onset  . Arthritis Mother   . Ankylosing spondylitis Mother   . Arthritis Father   . Other Father        car accident  . Arthritis Maternal Grandmother        Rheumatoid Arthritis  . Breast cancer Maternal Grandmother 60  . Diabetes Other   .  Hypertension Other        grandparent  . Stroke Other        grandparent  . Anxiety disorder Brother   . Hypertension Brother   . Healthy Half-Brother   . Healthy Half-Sister     SOCIAL HX: see hpi   Current Outpatient Medications:  .  Cholecalciferol (VITAMIN D PO), Take by mouth., Disp: , Rfl:  .  losartan (COZAAR) 50 MG tablet, Take 1 tablet (50 mg total) by mouth daily., Disp: 90 tablet, Rfl: 1 .  Multiple Vitamin (MULTIVITAMIN) capsule, Take 1 capsule by mouth daily., Disp: , Rfl:  .  Omega-3 Fatty Acids (FISH OIL PO), Take by mouth., Disp: , Rfl:  .  venlafaxine XR (EFFEXOR XR) 150 MG 24 hr capsule, Take 1 capsule (150 mg total) by mouth daily with breakfast., Disp: 90 capsule, Rfl: 1  EXAM:  VITALS per patient if applicable: see hpi  GENERAL: alert, oriented, appears well and in no acute distress  HEENT: atraumatic, conjunttiva clear, no obvious abnormalities on inspection of external nose and ears  NECK: normal movements of the head and neck  LUNGS: on inspection no signs of respiratory distress, breathing rate appears normal, no obvious gross SOB, gasping or wheezing  CV: no obvious cyanosis  MS: moves all visible extremities without noticeable abnormality  PSYCH/NEURO: pleasant and cooperative, no obvious depression or anxiety, speech and thought processing grossly intact  ASSESSMENT AND PLAN:  Discussed the following assessment and  plan:  Essential hypertension - Plan: losartan (COZAAR) 50 MG tablet  Anxiety and depression - Plan: venlafaxine XR (EFFEXOR XR) 150 MG 24 hr capsule  Seasonal allergies  -we discussed possible serious and likely etiologies, options for evaluation and workup, limitations of telemedicine visit vs in person visit, treatment, treatment risks and precautions. Pt prefers to treat via telemedicine empirically rather then risking or undertaking an in person visit at this moment.  Continue antihypertensive, refills provided. Discussed  rechecking labs, but she prefers to wait. Continue Velafaxine - med list updated and refill sent. For seasonal allergies suggested anthistamine and flonase during trouble seasons. Follow up in 4-6 months. She prefers telemedicine visit. Sent message to schedulers to assist. Follow up as needed sooner if concerns.  I discussed the assessment and treatment plan with the patient. The patient was provided an opportunity to ask questions and all were answered. The patient agreed with the plan and demonstrated an understanding of the instructions.    Lucretia Kern, DO

## 2020-06-05 NOTE — Progress Notes (Signed)
Patient scheduled 12/03/2020 at 1 PM

## 2020-07-02 DIAGNOSIS — Z6827 Body mass index (BMI) 27.0-27.9, adult: Secondary | ICD-10-CM | POA: Diagnosis not present

## 2020-07-02 DIAGNOSIS — Z01419 Encounter for gynecological examination (general) (routine) without abnormal findings: Secondary | ICD-10-CM | POA: Diagnosis not present

## 2020-07-09 DIAGNOSIS — Z1231 Encounter for screening mammogram for malignant neoplasm of breast: Secondary | ICD-10-CM | POA: Diagnosis not present

## 2020-12-03 ENCOUNTER — Telehealth: Payer: Self-pay | Admitting: Family Medicine

## 2020-12-10 ENCOUNTER — Telehealth (INDEPENDENT_AMBULATORY_CARE_PROVIDER_SITE_OTHER): Payer: BC Managed Care – PPO | Admitting: Family Medicine

## 2020-12-10 VITALS — BP 121/79 | Wt 160.0 lb

## 2020-12-10 DIAGNOSIS — F32A Depression, unspecified: Secondary | ICD-10-CM

## 2020-12-10 DIAGNOSIS — E663 Overweight: Secondary | ICD-10-CM

## 2020-12-10 DIAGNOSIS — F419 Anxiety disorder, unspecified: Secondary | ICD-10-CM | POA: Diagnosis not present

## 2020-12-10 DIAGNOSIS — N926 Irregular menstruation, unspecified: Secondary | ICD-10-CM | POA: Diagnosis not present

## 2020-12-10 DIAGNOSIS — I10 Essential (primary) hypertension: Secondary | ICD-10-CM | POA: Diagnosis not present

## 2020-12-10 MED ORDER — VENLAFAXINE HCL ER 150 MG PO CP24: ORAL_CAPSULE | ORAL | 0 refills | Status: DC

## 2020-12-10 MED ORDER — VENLAFAXINE HCL ER 37.5 MG PO CP24: 37.5000 mg | ORAL_CAPSULE | Freq: Every day | ORAL | 0 refills | Status: DC

## 2020-12-10 MED ORDER — LOSARTAN POTASSIUM 50 MG PO TABS: 50.0000 mg | ORAL_TABLET | Freq: Every day | ORAL | 1 refills | Status: DC

## 2020-12-10 NOTE — Progress Notes (Signed)
Patient scheduled for a 3 months follow up with Dr. Ethlyn Gallery on 03/12/2021

## 2020-12-10 NOTE — Progress Notes (Signed)
Virtual Visit via Video Note  I connected with Alison [name]  on 12/10/20 at  1:20 PM EDT by a video enabled telemedicine application and verified that I am speaking with the correct person using two identifiers.  Location patient: home, [place] Location provider:work or home office Persons participating in the virtual visit: patient, provider  I discussed the limitations of evaluation and management by telemedicine and the availability of in person appointments. The patient expressed understanding and agreed to proceed.   HPI:  Acute telemedicine visit for Med Check:  Hypertension: -BP  -taking losartan 50mg  daily -unfortunately she has not been exercising much -reports has gain weight -160 lbs and BP 121/79 -no reported CP, SOB, HA  Anxiety/Depression: -reports mood has been a little worse - has had a lot of stress as grandmother died and 42 yo child has been sick a lot after starting back to [place] -taking effexor 150 mg daily, wants to increase the dose effexor a little -does not feel has time for counseling -denies SI, thoughts of harm, hallucinations   -gained some weight this year -periods have been a little irregular -saw gyn for the periods and they offered OCP, she would prefer not to take pills for this   ROS: See pertinent positives and negatives per HPI.  Past Medical History:  Diagnosis Date  . Anxiety and depression   . Cancer ([place]) 03/2014   basal carcinoma  . Gestational hypertension 11/2011  . Nephrolithiasis   . Seasonal allergies     Past Surgical History:  Procedure Laterality Date  . MOHS SURGERY  03/2014   BCC on nose.     Current Outpatient Medications:  .  venlafaxine XR (EFFEXOR XR) 37.5 MG 24 hr capsule, Take 1 capsule (37.5 mg total) by mouth daily with breakfast. Take 150mg  tablet with the 37.5mg  tablet daily to equal 187.5 mg daily, Disp: 90 capsule, Rfl: 0 .  Cholecalciferol (VITAMIN D PO), Take by mouth., Disp: , Rfl:  .  losartan  (COZAAR) 50 MG tablet, Take 1 tablet (50 mg total) by mouth daily., Disp: 90 tablet, Rfl: 1 .  Multiple Vitamin (MULTIVITAMIN) capsule, Take 1 capsule by mouth daily., Disp: , Rfl:  .  Omega-3 Fatty Acids (FISH OIL PO), Take by mouth., Disp: , Rfl:  .  venlafaxine XR (EFFEXOR XR) 150 MG 24 hr capsule, Take 150mg  tablet with the 37.5mg  tablet daily to equal 187.5 mg daily, Disp: 90 capsule, Rfl: 0  EXAM:  VITALS per patient if applicable:  GENERAL: alert, oriented, appears well and in no acute distress  HEENT: atraumatic, conjunttiva clear, no obvious abnormalities on inspection of external nose and ears  NECK: normal movements of the head and neck  LUNGS: on inspection no signs of respiratory distress, breathing rate appears normal, no obvious gross SOB, gasping or wheezing  CV: no obvious cyanosis  MS: moves all visible extremities without noticeable abnormality  PSYCH/NEURO: pleasant and cooperative, no obvious depression or anxiety, speech and thought processing grossly intact  ASSESSMENT AND PLAN:  Discussed the following assessment and plan:  Essential hypertension - Plan: losartan (COZAAR) 50 MG tablet, Basic metabolic panel, CBC, Lipid panel -discussed BP goals -advised healthy diet, regular exercise and stress management -labs per orders -cont current medication with 3 mo follow up in office with PCP  Anxiety and depression - Plan: venlafaxine XR (EFFEXOR XR) 150 MG 24 hr capsule -advised CBT -she prefers to increase Effexor a little as well - increased to 187.5mg  daily per patient  preferene -advised 3 month follow up with PCP, sooner if needed/changes/concerns/ worsening/  Overweight (BMI 25.0-29.9) - Plan: Lipid panel, TSH -discussed metabolism changes -discussed diet and exercise and stress management -medications can also contribute -labs per orders -she feels hormones are playing a part - book suggestion: Dr. Alvina Filbert, Your Body in Balance  Irregular  menses - Plan: TSH -seeing gyn  -we discussed possible serious and likely etiologies, options for evaluation and workup, limitations of telemedicine visit vs in person visit, treatment, treatment risks and precautions for the various issues with summary of plan above. Scheduled follow up with PCP offered: Sent message to schedulers to assist and advised patient to contact PCP office to schedule if does not receive call back in next 24 hours - 3 month in office follow up advised.  Advised to seek care sooner if worsening, new symptoms arise, or if is not improving with treatment. . Did let this patient know that I only do telemedicine on Tuesdays and Thursdays for [facility].    I discussed the assessment and treatment plan with the patient. The patient was provided an opportunity to ask questions and all were answered. The patient agreed with the plan and demonstrated an understanding of the instructions.     Lucretia Kern, DO

## 2020-12-10 NOTE — Patient Instructions (Signed)
-  I sent the medication(s) we discussed to your pharmacy: Meds ordered this encounter  Medications  . venlafaxine XR (EFFEXOR XR) 37.5 MG 24 hr capsule    Sig: Take 1 capsule (37.5 mg total) by mouth daily with breakfast. Take 150mg  tablet with the 37.5mg  tablet daily to equal 187.5 mg daily    Dispense:  90 capsule    Refill:  0  . losartan (COZAAR) 50 MG tablet    Sig: Take 1 tablet (50 mg total) by mouth daily.    Dispense:  90 tablet    Refill:  1  . venlafaxine XR (EFFEXOR XR) 150 MG 24 hr capsule    Sig: Take 150mg  tablet with the 37.5mg  tablet daily to equal 187.5 mg daily    Dispense:  90 capsule    Refill:  0   Schedule a lab visit in the next 1-2 weeks. May drink plenty of water - but please fast for 8 hours prior to labs if possible.  Please schedule a 3 month in office follow up appointment with your Primary Care office.   []   Consider Cognitive Behavioral Therapy to help with Stress:   [facility] is a good option.   Call for appointment: [phone]  Consider checking out the book: Your Body in Balance by Dr. Alvina Filbert  I hope you are feeling better soon!  Seek follow care sooner promptly if your symptoms worsen, new concerns arise or you are not improving with treatment.  It was nice to meet you today. I help [facility] out with telemedicine visits on Tuesdays and Thursdays and am available for visits on those days. If you have any concerns or questions following this visit please schedule a follow up visit with your Primary Care doctor or seek care at a local urgent care clinic to avoid delays in care.

## 2020-12-11 ENCOUNTER — Other Ambulatory Visit: Payer: BC Managed Care – PPO

## 2020-12-13 ENCOUNTER — Other Ambulatory Visit: Payer: Self-pay

## 2020-12-13 ENCOUNTER — Other Ambulatory Visit (INDEPENDENT_AMBULATORY_CARE_PROVIDER_SITE_OTHER): Payer: BC Managed Care – PPO

## 2020-12-13 DIAGNOSIS — N926 Irregular menstruation, unspecified: Secondary | ICD-10-CM

## 2020-12-13 DIAGNOSIS — E663 Overweight: Secondary | ICD-10-CM | POA: Diagnosis not present

## 2020-12-13 DIAGNOSIS — I10 Essential (primary) hypertension: Secondary | ICD-10-CM | POA: Diagnosis not present

## 2020-12-13 LAB — LIPID PANEL
Cholesterol: 164 mg/dL (ref 0–200)
HDL: 54.8 mg/dL (ref >39.00)
LDL Cholesterol: 94 mg/dL (ref 0–99)
NonHDL: 109.3
Total CHOL/HDL Ratio: 3
Triglycerides: 76 mg/dL (ref 0.0–149.0)
VLDL: 15.2 mg/dL (ref 0.0–40.0)

## 2020-12-13 LAB — BASIC METABOLIC PANEL
BUN: 22 mg/dL (ref 6–23)
CO2: 26 mEq/L (ref 19–32)
Calcium: 9 mg/dL (ref 8.4–10.5)
Chloride: 104 mEq/L (ref 96–112)
Creatinine, Ser: 0.88 mg/dL (ref 0.40–1.20)
GFR: 82.19 mL/min (ref >60.00)
Glucose, Bld: 95 mg/dL (ref 70–99)
Potassium: 4.2 mEq/L (ref 3.5–5.1)
Sodium: 139 mEq/L (ref 135–145)

## 2020-12-13 LAB — CBC
HCT: 39.3 % (ref 36.0–46.0)
Hemoglobin: 13.8 g/dL (ref 12.0–15.0)
MCHC: 35.1 g/dL (ref 30.0–36.0)
MCV: 88.9 fl (ref 78.0–100.0)
Platelets: 191 10*3/uL (ref 150.0–400.0)
RBC: 4.41 Mil/uL (ref 3.87–5.11)
RDW: 12.2 % (ref 11.5–15.5)
WBC: 5.3 10*3/uL (ref 4.0–10.5)

## 2020-12-13 LAB — TSH: TSH: 1.38 u[IU]/mL (ref 0.35–4.50)

## 2021-03-10 ENCOUNTER — Other Ambulatory Visit: Payer: Self-pay | Admitting: Family Medicine

## 2021-03-10 DIAGNOSIS — F32A Depression, unspecified: Secondary | ICD-10-CM

## 2021-03-10 DIAGNOSIS — F419 Anxiety disorder, unspecified: Secondary | ICD-10-CM

## 2021-03-12 ENCOUNTER — Ambulatory Visit: Payer: BC Managed Care – PPO | Admitting: Family Medicine

## 2021-04-07 ENCOUNTER — Ambulatory Visit: Payer: BC Managed Care – PPO | Admitting: Family Medicine

## 2021-04-07 ENCOUNTER — Other Ambulatory Visit: Payer: Self-pay

## 2021-04-07 ENCOUNTER — Encounter: Payer: Self-pay | Admitting: Family Medicine

## 2021-04-07 VITALS — BP 122/82 | HR 80 | Temp 98.7°F | Ht 62.0 in | Wt 162.5 lb

## 2021-04-07 DIAGNOSIS — Z0000 Encounter for general adult medical examination without abnormal findings: Secondary | ICD-10-CM

## 2021-04-07 DIAGNOSIS — F419 Anxiety disorder, unspecified: Secondary | ICD-10-CM | POA: Diagnosis not present

## 2021-04-07 DIAGNOSIS — R635 Abnormal weight gain: Secondary | ICD-10-CM

## 2021-04-07 DIAGNOSIS — I10 Essential (primary) hypertension: Secondary | ICD-10-CM

## 2021-04-07 DIAGNOSIS — F32A Depression, unspecified: Secondary | ICD-10-CM

## 2021-04-07 NOTE — Patient Instructions (Addendum)
Consider 1200-1500 cal/day for weight loss.   *check obgyn records to see if you had Tdap and last date; let me know.

## 2021-04-07 NOTE — Progress Notes (Signed)
Alison [name] DOB: 08-04-1981 Encounter date: 04/07/2021  This is a 40 y.o. female who presents with Chief Complaint  Patient presents with   Follow-up    History of present illness: Last visit with me is 02/16/2020 to establish care.  Hypertension: Losartan 50 mg daily. Checks on occasion.   Anxiety/depression: She saw Dr. Selena Batten for virtual visit in March and they increased Effexor to 187.5 mg daily due to some increased stressors. Did feel like this was helpful. More good than bad days. Still some low energy. Did take wellbutrin in past - thinks it made her feel more anxious by itself. Lexapro and prozac didn't help as much with mood as the SNRI.   Seasonal allergies: decent control; uses prn otc claritin.   She has gained weight in last year. Thinks weight gain started after last child once she stopped breast feeding. Worse in last year. Feels more comfortable around 140. Walking and going to pilates once a week. Period has gotten shorter, more cramping/bleeding. More bloating; rings not fitting and does not seem dependent on sodium intake. More water retention. More moodiness. Talked with gyn about it, but suggestion was birth control and didn't want to this.   No Known Allergies Current Meds  Medication Sig   Cholecalciferol (VITAMIN D PO) Take by mouth.   losartan (COZAAR) 50 MG tablet Take 1 tablet (50 mg total) by mouth daily.   Multiple Vitamin (MULTIVITAMIN) capsule Take 1 capsule by mouth daily.   Omega-3 Fatty Acids (FISH OIL PO) Take by mouth.   venlafaxine XR (EFFEXOR-XR) 150 MG 24 hr capsule TAKE ONE CAPSULE BY MOUTH EVERY DAY WITH 37.5 MG TO EQUAL 187.5 MG DAILY   venlafaxine XR (EFFEXOR-XR) 37.5 MG 24 hr capsule TAKE ONE CAPSULE BY MOUTH DAILY WITH BREAKFAST. TAKE 150 MG WITH 37.5 MG CAPSULE DAILY TO EQUAL 187.5 MG DAILY    Review of Systems  Constitutional:  Positive for fatigue. Negative for activity change, appetite change, chills, fever and unexpected weight change.   HENT:  Negative for congestion, ear pain, hearing loss, sinus pressure, sinus pain, sore throat and trouble swallowing.   Eyes:  Negative for pain and visual disturbance.  Respiratory:  Negative for cough, chest tightness, shortness of breath and wheezing.   Cardiovascular:  Negative for chest pain, palpitations and leg swelling.  Gastrointestinal:  Negative for abdominal pain, blood in stool, constipation, diarrhea, nausea and vomiting.  Genitourinary:  Negative for difficulty urinating and menstrual problem.  Musculoskeletal:  Negative for arthralgias and back pain.  Skin:  Negative for rash.  Neurological:  Negative for dizziness, weakness, numbness and headaches.  Hematological:  Negative for adenopathy. Does not bruise/bleed easily.  Psychiatric/Behavioral:  Negative for sleep disturbance (does snore; per husband doesn't stop breathing) and suicidal ideas. The patient is not nervous/anxious.        Does fall asleep with melatonin; sleeps 7 hours.    Objective:  BP 122/82 (BP Location: Right Arm, Patient Position: Sitting, Cuff Size: Normal)   Pulse 80   Temp 98.7 F (37.1 C) (Oral)   Ht 5\' 2"  (1.575 m)   Wt 162 lb 8 oz (73.7 kg)   LMP 03/31/2021 (Exact Date)   SpO2 98%   BMI 29.72 kg/m   Weight: 162 lb 8 oz (73.7 kg)   BP Readings from Last 3 Encounters:  04/07/21 122/82  12/10/20 121/79  04/30/20 108/77   Wt Readings from Last 3 Encounters:  04/07/21 162 lb 8 oz (73.7 kg)  12/10/20  160 lb (72.6 kg)  04/30/20 154 lb (69.9 kg)    Physical Exam Constitutional:      General: She is not in acute distress.    Appearance: She is well-developed.  HENT:     Head: Normocephalic and atraumatic.     Right Ear: External ear normal.     Left Ear: External ear normal.     Mouth/Throat:     Pharynx: No oropharyngeal exudate.  Eyes:     Conjunctiva/sclera: Conjunctivae normal.     Pupils: Pupils are equal, round, and reactive to light.  Neck:     Thyroid: No thyromegaly.   Cardiovascular:     Rate and Rhythm: Normal rate and regular rhythm.     Heart sounds: Murmur heard.  Systolic murmur is present with a grade of 2/6.    No friction rub. No gallop.  Pulmonary:     Effort: Pulmonary effort is normal.     Breath sounds: Normal breath sounds.  Abdominal:     General: Bowel sounds are normal. There is no distension.     Palpations: Abdomen is soft. There is no mass.     Tenderness: There is no abdominal tenderness. There is no guarding.     Hernia: No hernia is present.  Musculoskeletal:        General: No tenderness or deformity. Normal range of motion.     Cervical back: Normal range of motion and neck supple.  Lymphadenopathy:     Cervical: No cervical adenopathy.  Skin:    General: Skin is warm and dry.     Findings: No rash.  Neurological:     Mental Status: She is alert and oriented to person, place, and time.     Deep Tendon Reflexes: Reflexes normal.     Reflex Scores:      Tricep reflexes are 2+ on the right side and 2+ on the left side.      Bicep reflexes are 2+ on the right side and 2+ on the left side.      Brachioradialis reflexes are 2+ on the right side and 2+ on the left side.      Patellar reflexes are 2+ on the right side and 2+ on the left side. Psychiatric:        Speech: Speech normal.        Behavior: Behavior normal.        Thought Content: Thought content normal.    Assessment/Plan  1. Preventative health care Keep up with regular exercise, but did discuss higher intensity interval exercise to help with weight loss.  Discussed limiting calories to 12 to 1500 cal in a day to see if she can be successful with some weight loss with this.  Let me know if not successful.  We also discussed snoring.  If no success with weight loss and continuing to feel fatigued, could certainly consider sleep evaluation.  2. Primary hypertension Continue losartan 50 mg daily.  3. Weight gain May be related to Effexor.  She also feels that  she is in perimenopausal realm.  We discussed help with these things may make weight loss slightly more difficult.  She is going to work on weight loss as discussed above and let me know if difficulty with this.  She prefers to stay on same dose of Effexor for the time being since it is doing a good job controlling mood.  4.  Anxiety and depression: She is doing well with current dose of Effexor.  Continue this.   Return in about 6 months (around 10/08/2021) for Chronic condition visit.   Theodis Shove, MD

## 2021-05-27 ENCOUNTER — Other Ambulatory Visit: Payer: Self-pay | Admitting: Family Medicine

## 2021-05-27 DIAGNOSIS — F32A Depression, unspecified: Secondary | ICD-10-CM

## 2021-06-09 DIAGNOSIS — Z20822 Contact with and (suspected) exposure to covid-19: Secondary | ICD-10-CM | POA: Diagnosis not present

## 2021-06-30 ENCOUNTER — Encounter: Payer: Self-pay | Admitting: Family Medicine

## 2021-07-01 ENCOUNTER — Encounter: Payer: Self-pay | Admitting: Adult Health

## 2021-07-01 ENCOUNTER — Other Ambulatory Visit: Payer: Self-pay

## 2021-07-01 ENCOUNTER — Ambulatory Visit: Payer: BC Managed Care – PPO | Admitting: Adult Health

## 2021-07-01 ENCOUNTER — Ambulatory Visit (INDEPENDENT_AMBULATORY_CARE_PROVIDER_SITE_OTHER)
Admission: RE | Admit: 2021-07-01 | Discharge: 2021-07-01 | Disposition: A | Discharge status: Home / Self Care | Payer: BC Managed Care – PPO | Source: Ambulatory Visit | Attending: Adult Health | Admitting: Adult Health

## 2021-07-01 VITALS — BP 120/82 | HR 78 | Temp 98.0°F | Ht 62.0 in | Wt 160.0 lb

## 2021-07-01 DIAGNOSIS — R0989 Other specified symptoms and signs involving the circulatory and respiratory systems: Secondary | ICD-10-CM | POA: Diagnosis not present

## 2021-07-01 DIAGNOSIS — R059 Cough, unspecified: Secondary | ICD-10-CM | POA: Diagnosis not present

## 2021-07-01 MED ORDER — PREDNISONE 10 MG PO TABS: ORAL_TABLET | ORAL | 0 refills | Status: DC

## 2021-07-01 NOTE — Progress Notes (Signed)
Subjective:    Patient ID: Alison [name], female    DOB: [birth year], 40 y.o.   MRN: [id number]  HPI 40 year old female who  has a past medical history of Anxiety and depression, Benign gestational thrombocytopenia (HCC) (12/03/2017), Cancer (HCC) (03/2014), Gestational hypertension (11/2011), Nephrolithiasis, Obstetrical laceration - bilateral perilabial splays (12/03/2017), and Seasonal allergies.  She is a patient of Dr. Hassan Rowan who I am seeing today for an acute issue. She tested positive for COVID 19 about four weeks ago. She continues to have chest congestion and productive cough with yellow mucus. .  She reports "burning sensation in my lungs".  Reports that she did feel better for 5 or 6 days after her COVID infection but that her symptoms came back.  She has not had any wheezing has shortness of breath with exertion.  No fevers or chills  Review of Systems See HPI   Past Medical History:  Diagnosis Date   Anxiety and depression    Benign gestational thrombocytopenia (HCC) 12/03/2017   Cancer (HCC) 03/2014   basal carcinoma   Gestational hypertension 11/2011   Nephrolithiasis    Obstetrical laceration - bilateral perilabial splays 12/03/2017   Seasonal allergies     Social History   Socioeconomic History   Marital status: Married    Spouse name: Alison [name]   Number of children: 1   Years of education: MA   Highest education level: Not on file  Occupational History    Comment: prn   Occupation:    Tobacco Use   Smoking status: Never   Smokeless tobacco: Never   Tobacco comments:    Married, lives with spouse. 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  Vaping Use   Vaping Use: Never used  Substance and Sexual Activity   Alcohol use: Yes    Comment: 1 drink every two weeks   Drug use: No   Sexual activity: Yes  Other Topics Concern   Not on file  Social History Narrative   Work or School: works part time as Veterinary surgeon - for th  [employer] Situation: lives with son (5 yo) and husband      Spiritual Beliefs: Christian      Lifestyle: trying to get exercise; diet is fair      Social Determinants of Corporate investment banker Strain: Not on file  Food Insecurity: Not on file  Transportation Needs: Not on file  Physical Activity: Not on file  Stress: Not on file  Social Connections: Not on file  Intimate Partner Violence: Not on file    Past Surgical History:  Procedure Laterality Date   MOHS SURGERY  03/2014   BCC on nose.    Family History  Problem Relation Age of Onset   Arthritis Mother    Ankylosing spondylitis Mother    Arthritis Father    Other Father        car accident   Arthritis Maternal Grandmother        Rheumatoid Arthritis   Breast cancer Maternal Grandmother 80   Diabetes Other    Hypertension Other        grandparent   Stroke Other        grandparent   Anxiety disorder Brother    Hypertension Brother    Healthy Half-Brother    Healthy Half-Sister     No Known Allergies  Current Outpatient Medications on File Prior to Visit  Medication Sig Dispense Refill   Cholecalciferol (VITAMIN D PO) Take by mouth.     losartan (COZAAR) 50 MG tablet Take 1 tablet (50 mg total) by mouth daily. 90 tablet 1   Multiple Vitamin (MULTIVITAMIN) capsule Take 1 capsule by mouth daily.     Omega-3 Fatty Acids (FISH OIL PO) Take by mouth.     venlafaxine XR (EFFEXOR-XR) 150 MG 24 hr capsule TAKE 1 CAPSULE BY MOUTH EVERY DAY WITH 37.5 MG TO EQUAL 187.5 MG DAILY 90 capsule 1   venlafaxine XR (EFFEXOR-XR) 37.5 MG 24 hr capsule TAKE 1 CAPSULE BY MOUTH DAILY WITH BREAKFAST. TAKE WITH 150 MG TO EQUAL 187.5 MG DAILY. 90 capsule 1   No current facility-administered medications on file prior to visit.    BP 120/82   Pulse 78   Temp 98 F (36.7 C) (Oral)   Ht 5\' 2"  (1.575 m)   Wt 160 lb (72.6 kg)   SpO2 98%   BMI 29.26 kg/m        Objective:   Physical Exam Vitals and nursing note  reviewed.  Constitutional:      Appearance: Normal appearance.  HENT:     Right Ear: Tympanic membrane, ear canal and external ear normal. There is no impacted cerumen.     Left Ear: Tympanic membrane, ear canal and external ear normal.     Nose: Nose normal. No congestion or rhinorrhea.  Cardiovascular:     Rate and Rhythm: Normal rate and regular rhythm.     Pulses: Normal pulses.     Heart sounds: Normal heart sounds.  Pulmonary:     Effort: Pulmonary effort is normal. No respiratory distress.     Breath sounds: Normal breath sounds. No stridor. No wheezing or rhonchi.  Skin:    General: Skin is warm and dry.     Capillary Refill: Capillary refill takes less than 2 seconds.  Neurological:     General: No focal deficit present.     Mental Status: She is alert and oriented to person, place, and time.  Psychiatric:        Mood and Affect: Mood normal.        Behavior: Behavior normal.        Thought Content: Thought content normal.        Judgment: Judgment normal.      Assessment & Plan:  1. Chest congestion -Prescribe prednisone taper.  We will get chest x-ray today.  Consider antibiotic therapy if warranted - predniSONE (DELTASONE) 10 MG tablet; 40 mg x 3 days, 20 mg x 3 days, 10 mg x 3 days  Dispense: 21 tablet; Refill: 0 - DG Chest 2 View; Future  Shirline Frees, NP

## 2021-07-01 NOTE — Patient Instructions (Signed)
It was great seeing you today   I have sent in prednisone and will do a chest xray today

## 2021-07-15 DIAGNOSIS — Z124 Encounter for screening for malignant neoplasm of cervix: Secondary | ICD-10-CM | POA: Diagnosis not present

## 2021-07-15 DIAGNOSIS — Z6829 Body mass index (BMI) 29.0-29.9, adult: Secondary | ICD-10-CM | POA: Diagnosis not present

## 2021-07-15 DIAGNOSIS — N920 Excessive and frequent menstruation with regular cycle: Secondary | ICD-10-CM | POA: Diagnosis not present

## 2021-07-15 DIAGNOSIS — Z01419 Encounter for gynecological examination (general) (routine) without abnormal findings: Secondary | ICD-10-CM | POA: Diagnosis not present

## 2021-07-15 DIAGNOSIS — Z1231 Encounter for screening mammogram for malignant neoplasm of breast: Secondary | ICD-10-CM | POA: Diagnosis not present

## 2021-07-19 LAB — HM PAP SMEAR: HPV, high-risk: NEGATIVE

## 2021-07-24 DIAGNOSIS — N920 Excessive and frequent menstruation with regular cycle: Secondary | ICD-10-CM | POA: Diagnosis not present

## 2021-08-28 DIAGNOSIS — D225 Melanocytic nevi of trunk: Secondary | ICD-10-CM | POA: Diagnosis not present

## 2021-08-28 DIAGNOSIS — Z85828 Personal history of other malignant neoplasm of skin: Secondary | ICD-10-CM | POA: Diagnosis not present

## 2021-08-28 DIAGNOSIS — D2271 Melanocytic nevi of right lower limb, including hip: Secondary | ICD-10-CM | POA: Diagnosis not present

## 2021-08-28 DIAGNOSIS — L820 Inflamed seborrheic keratosis: Secondary | ICD-10-CM | POA: Diagnosis not present

## 2021-08-28 DIAGNOSIS — D2261 Melanocytic nevi of right upper limb, including shoulder: Secondary | ICD-10-CM | POA: Diagnosis not present

## 2021-09-08 ENCOUNTER — Other Ambulatory Visit: Payer: Self-pay | Admitting: Family Medicine

## 2021-09-08 DIAGNOSIS — I10 Essential (primary) hypertension: Secondary | ICD-10-CM

## 2021-11-28 ENCOUNTER — Ambulatory Visit: Payer: BC Managed Care – PPO | Admitting: Family Medicine

## 2021-12-05 ENCOUNTER — Ambulatory Visit: Payer: BC Managed Care – PPO | Admitting: Family Medicine

## 2021-12-05 ENCOUNTER — Encounter: Payer: Self-pay | Admitting: Family Medicine

## 2021-12-05 VITALS — BP 112/78 | HR 80 | Temp 97.9°F | Ht 62.0 in | Wt 168.3 lb

## 2021-12-05 DIAGNOSIS — N921 Excessive and frequent menstruation with irregular cycle: Secondary | ICD-10-CM | POA: Diagnosis not present

## 2021-12-05 DIAGNOSIS — R5383 Other fatigue: Secondary | ICD-10-CM

## 2021-12-05 DIAGNOSIS — F339 Major depressive disorder, recurrent, unspecified: Secondary | ICD-10-CM

## 2021-12-05 DIAGNOSIS — I10 Essential (primary) hypertension: Secondary | ICD-10-CM | POA: Diagnosis not present

## 2021-12-05 DIAGNOSIS — Z13220 Encounter for screening for lipoid disorders: Secondary | ICD-10-CM

## 2021-12-05 DIAGNOSIS — Z131 Encounter for screening for diabetes mellitus: Secondary | ICD-10-CM | POA: Diagnosis not present

## 2021-12-05 DIAGNOSIS — J302 Other seasonal allergic rhinitis: Secondary | ICD-10-CM

## 2021-12-05 DIAGNOSIS — E559 Vitamin D deficiency, unspecified: Secondary | ICD-10-CM | POA: Diagnosis not present

## 2021-12-05 MED ORDER — VENLAFAXINE HCL ER 75 MG PO CP24: 75.0000 mg | ORAL_CAPSULE | Freq: Every day | ORAL | 1 refills | Status: DC

## 2021-12-05 NOTE — Patient Instructions (Signed)
Dr. Zigmund Daniel psychology through [facility]: [phone] ?

## 2021-12-05 NOTE — Progress Notes (Signed)
?Alison [name] ?DOB: March 14, 1981 ?Encounter date: 12/05/2021 ? ?This is a 41 y.o. female who presents with ?Chief Complaint  ?Patient presents with  ? Follow-up  ? ? ?History of present illness: ?Last blood work was 1 year ago. ?Hypertension: Losartan 50 mg. Blood pressure has been normal at home.  ?Anxiety/depression: Effexor - wondering bout increasing effexor. Feeling more down, less motivated. Trying to find therapist, but has been hard. Has been on this dose for at least 9 months now. In past did take wellbutrin alone- made her a little motivated - too much alone; was better with something else. Lexapro made her gain a lot of weight. Prozac didn't help as much as effexor. Does keep gaining weight.  ? ?Seasonal allergies: bad right now. Using otc claritin and flonase nasal spray which helps.  ? ?Feels that she has some hormonal changes - periods every 3 weeks. They are heavy. Talked with gyn about ablation. She is debating this. Having hard time with weight gain. Has just 2 weeks a month where she doesn't have period. Does pass blood clots. On heavy days will soak regular tampon in an hour. Usually 2 heavy days/cycle, but cycle itself is lasting 5-7 days.  ? ?Anemia in pregnancy, but not outside of that.  ? ?Taking vitamin D regularly. Uncertain dose.  ? ?No Known Allergies ?Current Meds  ?Medication Sig  ? Cholecalciferol (VITAMIN D PO) Take by mouth.  ? losartan (COZAAR) 50 MG tablet TAKE 1 TABLET(50 MG) BY MOUTH DAILY  ? Multiple Vitamin (MULTIVITAMIN) capsule Take 1 capsule by mouth daily.  ? Omega-3 Fatty Acids (FISH OIL PO) Take by mouth.  ? venlafaxine XR (EFFEXOR XR) 75 MG 24 hr capsule Take 1 capsule (75 mg total) by mouth daily with breakfast. Take with the 150mg  effexor XR  ? [DISCONTINUED] venlafaxine XR (EFFEXOR-XR) 150 MG 24 hr capsule TAKE 1 CAPSULE BY MOUTH EVERY DAY WITH 37.5 MG TO EQUAL 187.5 MG DAILY  ? [DISCONTINUED] venlafaxine XR (EFFEXOR-XR) 37.5 MG 24 hr capsule TAKE 1 CAPSULE BY MOUTH  DAILY WITH BREAKFAST. TAKE WITH 150 MG TO EQUAL 187.5 MG DAILY.  ? ? ?Review of Systems  ?Constitutional:  Negative for chills, fatigue and fever.  ?Respiratory:  Negative for cough, chest tightness, shortness of breath and wheezing.   ?Cardiovascular:  Negative for chest pain, palpitations and leg swelling.  ? ?Objective: ? ?BP 112/78 (BP Location: Left Arm, Patient Position: Sitting, Cuff Size: Normal)   Pulse 80   Temp 97.9 ?F (36.6 ?C) (Oral)   Ht 5\' 2"  (1.575 m)   Wt 168 lb 4.8 oz (76.3 kg)   LMP 11/26/2021   SpO2 99%   BMI 30.78 kg/m?   Weight: 168 lb 4.8 oz (76.3 kg)  ? ?BP Readings from Last 3 Encounters:  ?12/05/21 112/78  ?07/01/21 120/82  ?04/07/21 122/82  ? ?Wt Readings from Last 3 Encounters:  ?12/05/21 168 lb 4.8 oz (76.3 kg)  ?07/01/21 160 lb (72.6 kg)  ?04/07/21 162 lb 8 oz (73.7 kg)  ? ? ?Physical Exam ?Constitutional:   ?   General: She is not in acute distress. ?   Appearance: She is well-developed.  ?Cardiovascular:  ?   Rate and Rhythm: Normal rate and regular rhythm.  ?   Heart sounds: Normal heart sounds. No murmur heard. ?  No friction rub.  ?Pulmonary:  ?   Effort: Pulmonary effort is normal. No respiratory distress.  ?   Breath sounds: Normal breath sounds. No wheezing or rales.  ?  Abdominal:  ?   General: Abdomen is flat. Bowel sounds are normal.  ?   Palpations: Abdomen is soft.  ?Musculoskeletal:  ?   Right lower leg: No edema.  ?   Left lower leg: No edema.  ?Neurological:  ?   Mental Status: She is alert and oriented to person, place, and time.  ?Psychiatric:     ?   Behavior: Behavior normal.  ? ?PHQ9 SCORE ONLY 12/05/2021 12/05/2021 04/07/2021  ?PHQ-9 Total Score 4 4 3   ? ? ?Assessment/Plan ? ?1. Menometrorrhagia ?Start with blood work.  She is following regularly with gynecology and try to determine next step in care. ?- CBC with Differential/Platelet; Future ?- IBC + Ferritin; Future ? ?2. Vitamin D deficiency ?- VITAMIN D 25 Hydroxy (Vit-D Deficiency, Fractures); Future ? ?3.  Lipid screening ?- Lipid panel; Future ? ?4. Other fatigue ?- TSH; Future ?- Vitamin B12; Future ?- Folate; Future ?- Homocysteine; Future ?- Methylmalonic acid, serum; Future ? ?5. Primary hypertension ?Well-controlled with losartan.  Continue this at current dose. ?- CBC with Differential/Platelet; Future ?- Comprehensive metabolic panel; Future ? ?6. Seasonal allergies ?Manages with over-the-counter treatments as needed. ? ?7. Screening for diabetes mellitus ?- Hemoglobin A1c; Future ? ?8.  Depression ?We are going to increase Effexor to 25 mg daily.  We discussed alternative options like adding in Wellbutrin.  She prefers to try increasing the Effexor first.  She has tried other medications in the past including Lexapro and Prozac (see above).  If increase in Effexor is not working well for her, I would certainly consider doing GeneSight testing, to which she is agreeable.  She will keep me posted on improvement. ? ?Return for pending lab or imaging results. ? ? ? ?Theodis Shove, MD ?

## 2021-12-07 ENCOUNTER — Encounter: Payer: Self-pay | Admitting: Family Medicine

## 2021-12-07 DIAGNOSIS — R7989 Other specified abnormal findings of blood chemistry: Secondary | ICD-10-CM

## 2021-12-09 LAB — COMPREHENSIVE METABOLIC PANEL
AG Ratio: 1.7 (calc) (ref 1.0–2.5)
ALT: 19 U/L (ref 6–29)
AST: 20 U/L (ref 10–30)
Albumin: 4.4 g/dL (ref 3.6–5.1)
Alkaline phosphatase (APISO): 95 U/L (ref 31–125)
BUN/Creatinine Ratio: 16 (calc) (ref 6–22)
BUN: 20 mg/dL (ref 7–25)
CO2: 29 mmol/L (ref 20–32)
Calcium: 10.5 mg/dL — ABNORMAL HIGH (ref 8.6–10.2)
Chloride: 100 mmol/L (ref 98–110)
Creat: 1.27 mg/dL — ABNORMAL HIGH (ref 0.50–0.99)
Globulin: 2.6 g/dL (calc) (ref 1.9–3.7)
Glucose, Bld: 91 mg/dL (ref 65–99)
Potassium: 3.8 mmol/L (ref 3.5–5.3)
Sodium: 138 mmol/L (ref 135–146)
Total Bilirubin: 0.4 mg/dL (ref 0.2–1.2)
Total Protein: 7 g/dL (ref 6.1–8.1)

## 2021-12-09 LAB — CBC WITH DIFFERENTIAL/PLATELET
Absolute Monocytes: 596 cells/uL (ref 200–950)
Basophils Absolute: 57 cells/uL (ref 0–200)
Basophils Relative: 0.8 %
Eosinophils Absolute: 142 cells/uL (ref 15–500)
Eosinophils Relative: 2 %
HCT: 40.2 % (ref 35.0–45.0)
Hemoglobin: 13.5 g/dL (ref 11.7–15.5)
Lymphs Abs: 1590 cells/uL (ref 850–3900)
MCH: 30.2 pg (ref 27.0–33.0)
MCHC: 33.6 g/dL (ref 32.0–36.0)
MCV: 89.9 fL (ref 80.0–100.0)
MPV: 10.6 fL (ref 7.5–12.5)
Monocytes Relative: 8.4 %
Neutro Abs: 4714 cells/uL (ref 1500–7800)
Neutrophils Relative %: 66.4 %
Platelets: 223 10*3/uL (ref 140–400)
RBC: 4.47 10*6/uL (ref 3.80–5.10)
RDW: 12.5 % (ref 11.0–15.0)
Total Lymphocyte: 22.4 %
WBC: 7.1 10*3/uL (ref 3.8–10.8)

## 2021-12-09 LAB — IRON,TIBC AND FERRITIN PANEL
%SAT: 23 % (calc) (ref 16–45)
Ferritin: 23 ng/mL (ref 16–232)
Iron: 89 ug/dL (ref 40–190)
TIBC: 387 mcg/dL (calc) (ref 250–450)

## 2021-12-09 LAB — FOLATE: Folate: 9.6 ng/mL

## 2021-12-09 LAB — LIPID PANEL
Cholesterol: 202 mg/dL — ABNORMAL HIGH (ref <200)
HDL: 52 mg/dL (ref >=50)
LDL Cholesterol (Calc): 116 mg/dL (calc) — ABNORMAL HIGH
Non-HDL Cholesterol (Calc): 150 mg/dL (calc) — ABNORMAL HIGH (ref <130)
Total CHOL/HDL Ratio: 3.9 (calc) (ref <5.0)
Triglycerides: 227 mg/dL — ABNORMAL HIGH (ref <150)

## 2021-12-09 LAB — HEMOGLOBIN A1C
Hgb A1c MFr Bld: 5.4 % of total Hgb (ref <5.7)
Mean Plasma Glucose: 108 mg/dL
eAG (mmol/L): 6 mmol/L

## 2021-12-09 LAB — VITAMIN D 25 HYDROXY (VIT D DEFICIENCY, FRACTURES): Vit D, 25-Hydroxy: 48 ng/mL (ref 30–100)

## 2021-12-09 LAB — VITAMIN B12: Vitamin B-12: 375 pg/mL (ref 200–1100)

## 2021-12-09 LAB — METHYLMALONIC ACID, SERUM: Methylmalonic Acid, Quant: 228 nmol/L (ref 87–318)

## 2021-12-09 LAB — TSH: TSH: 1.13 mIU/L

## 2021-12-09 LAB — HOMOCYSTEINE: Homocysteine: 7.3 umol/L (ref <10.4)

## 2021-12-12 DIAGNOSIS — I8312 Varicose veins of left lower extremity with inflammation: Secondary | ICD-10-CM | POA: Diagnosis not present

## 2021-12-12 DIAGNOSIS — I8311 Varicose veins of right lower extremity with inflammation: Secondary | ICD-10-CM | POA: Diagnosis not present

## 2021-12-15 ENCOUNTER — Other Ambulatory Visit: Payer: Self-pay | Admitting: Family Medicine

## 2021-12-15 DIAGNOSIS — F32A Depression, unspecified: Secondary | ICD-10-CM

## 2021-12-16 ENCOUNTER — Telehealth: Payer: Self-pay | Admitting: Family Medicine

## 2021-12-16 NOTE — Telephone Encounter (Signed)
Patient called in stating that her venlafaxine XR (EFFEXOR XR) 150 MG 24 hr capsule [[id number]]  was denied and she wants to know what her next steps should be. Patient is requesting a phone call back. ? ?Please advise. ?

## 2021-12-17 ENCOUNTER — Other Ambulatory Visit: Payer: Self-pay | Admitting: Family Medicine

## 2021-12-17 ENCOUNTER — Telehealth: Payer: Self-pay | Admitting: Family Medicine

## 2021-12-17 ENCOUNTER — Other Ambulatory Visit: Payer: Self-pay

## 2021-12-17 DIAGNOSIS — F32A Depression, unspecified: Secondary | ICD-10-CM

## 2021-12-17 MED ORDER — VENLAFAXINE HCL ER 150 MG PO CP24: ORAL_CAPSULE | ORAL | 1 refills | Status: DC

## 2021-12-17 NOTE — Telephone Encounter (Signed)
I'm not sure who denied effexor - may have been on a refill? But I have sent in new '150mg'$  dose because her instructions were to take this along with the '75mg'$  dose. Pharmacy should fill both. If there is issue, please see what we need to do. Insurance shouldn't give issue with this because there isn't another way to make this total daily dose. ?

## 2021-12-17 NOTE — Telephone Encounter (Signed)
Patient is calling in asking if she needs to fast for her labs tomorrow. Dr.Koberlein told her two different things and she would like confirmation on what she should do. ? ?Please advise. ?

## 2021-12-17 NOTE — Telephone Encounter (Signed)
Reviewed chart. Pt notified that she can fast for labs; to let us know if she she does not. Pt verb understanding. ?

## 2021-12-18 ENCOUNTER — Other Ambulatory Visit (INDEPENDENT_AMBULATORY_CARE_PROVIDER_SITE_OTHER): Payer: BC Managed Care – PPO

## 2021-12-18 DIAGNOSIS — R7989 Other specified abnormal findings of blood chemistry: Secondary | ICD-10-CM | POA: Diagnosis not present

## 2021-12-18 LAB — BASIC METABOLIC PANEL
BUN: 19 mg/dL (ref 6–23)
CO2: 24 mEq/L (ref 19–32)
Calcium: 8.4 mg/dL (ref 8.4–10.5)
Chloride: 105 mEq/L (ref 96–112)
Creatinine, Ser: 0.89 mg/dL (ref 0.40–1.20)
GFR: 80.51 mL/min (ref >60.00)
Glucose, Bld: 102 mg/dL — ABNORMAL HIGH (ref 70–99)
Potassium: 4.1 mEq/L (ref 3.5–5.1)
Sodium: 136 mEq/L (ref 135–145)

## 2021-12-18 LAB — URINALYSIS, ROUTINE W REFLEX MICROSCOPIC
Bilirubin Urine: NEGATIVE
Ketones, ur: NEGATIVE
Leukocytes,Ua: NEGATIVE
Nitrite: NEGATIVE
Specific Gravity, Urine: 1.02 (ref 1.000–1.030)
Total Protein, Urine: NEGATIVE
Urine Glucose: NEGATIVE
Urobilinogen, UA: 0.2 (ref 0.0–1.0)
pH: 6 (ref 5.0–8.0)

## 2021-12-18 NOTE — Telephone Encounter (Signed)
Patient informed of the message below.

## 2021-12-19 LAB — PTH, INTACT AND CALCIUM
Calcium: 8.2 mg/dL — ABNORMAL LOW (ref 8.6–10.2)
PTH: 48 pg/mL (ref 16–77)

## 2022-01-22 ENCOUNTER — Encounter: Payer: Self-pay | Admitting: Family Medicine

## 2022-01-22 DIAGNOSIS — R32 Unspecified urinary incontinence: Secondary | ICD-10-CM

## 2022-01-26 ENCOUNTER — Telehealth (INDEPENDENT_AMBULATORY_CARE_PROVIDER_SITE_OTHER): Payer: BC Managed Care – PPO | Admitting: Family Medicine

## 2022-01-26 ENCOUNTER — Encounter: Payer: Self-pay | Admitting: Family Medicine

## 2022-01-26 DIAGNOSIS — H109 Unspecified conjunctivitis: Secondary | ICD-10-CM | POA: Diagnosis not present

## 2022-01-26 MED ORDER — TOBRAMYCIN-DEXAMETHASONE 0.3-0.1 % OP SUSP: 2.0000 [drp] | OPHTHALMIC | 0 refills | Status: DC

## 2022-01-26 NOTE — Progress Notes (Signed)
? ?Subjective:  ? ? Patient ID: Alison [name], female    DOB: July 26, 1981, 41 y.o.   MRN: [id number] ? ?HPI ?Virtual Visit via Video Note ? ?I connected with the patient on 01/26/22 at  2:30 PM EDT by a video enabled telemedicine application and verified that I am speaking with the correct person using two identifiers. ? Location patient: home ?Location provider:work or home office ?Persons participating in the virtual visit: patient, provider ? ?I discussed the limitations of evaluation and management by telemedicine and the availability of in person appointments. The patient expressed understanding and agreed to proceed. ? ? ?HPI: ?Here for 4 days of itching and sticky DC in both eyes. No pain or blurred vision. She does not wear contacts. She has been using her usual allergy meds including Claritin, Flonase, and Pataday eye drops. No other URI symptoms.  ? ? ?ROS: See pertinent positives and negatives per HPI. ? ?Past Medical History:  ?Diagnosis Date  ? Anxiety and depression   ? Benign gestational thrombocytopenia (HCC) 12/03/2017  ? Cancer (HCC) 03/2014  ? basal carcinoma  ? Gestational hypertension 11/2011  ? Nephrolithiasis   ? Obstetrical laceration - bilateral perilabial splays 12/03/2017  ? Seasonal allergies   ? ? ?Past Surgical History:  ?Procedure Laterality Date  ? MOHS SURGERY  03/2014  ? BCC on nose.  ? ? ?Family History  ?Problem Relation Age of Onset  ? Arthritis Mother   ? Ankylosing spondylitis Mother   ? Osteoporosis Mother 110  ? Arthritis Father   ? Other Father   ?     car accident  ? Anxiety disorder Brother   ? Hypertension Brother   ? Arthritis Maternal Grandmother   ?     Rheumatoid Arthritis  ? Breast cancer Maternal Grandmother 60  ? Healthy Half-Brother   ? Healthy Half-Sister   ? Diabetes Other   ? Hypertension Other   ?     grandparent  ? Stroke Other   ?     grandparent  ? ? ? ?Current Outpatient Medications:  ?  Cholecalciferol (VITAMIN D PO), Take by mouth., Disp: , Rfl:  ?  losartan  (COZAAR) 50 MG tablet, TAKE 1 TABLET(50 MG) BY MOUTH DAILY, Disp: 90 tablet, Rfl: 1 ?  Multiple Vitamin (MULTIVITAMIN) capsule, Take 1 capsule by mouth daily., Disp: , Rfl:  ?  Omega-3 Fatty Acids (FISH OIL PO), Take by mouth., Disp: , Rfl:  ?  venlafaxine XR (EFFEXOR XR) 75 MG 24 hr capsule, Take 1 capsule (75 mg total) by mouth daily with breakfast. Take with the 150mg  effexor XR, Disp: 90 capsule, Rfl: 1 ?  venlafaxine XR (EFFEXOR-XR) 150 MG 24 hr capsule, TAKE 1 CAPSULE BY MOUTH EVERY DAY WITH 75mg  to total 225mg  daily dose, Disp: 90 capsule, Rfl: 1 ?  tobramycin-dexamethasone (TOBRADEX) ophthalmic solution, Place 2 drops into both eyes every 4 (four) hours while awake., Disp: 5 mL, Rfl: 0 ? ?EXAM: ? ?VITALS per patient if applicable: ? ?GENERAL: alert, oriented, appears well and in no acute distress ? ?HEENT: atraumatic, conjunttiva clear, no obvious abnormalities on inspection of external nose and ears ? ?NECK: normal movements of the head and neck ? ?LUNGS: on inspection no signs of respiratory distress, breathing rate appears normal, no obvious gross SOB, gasping or wheezing ? ?CV: no obvious cyanosis ? ?MS: moves all visible extremities without noticeable abnormality ? ?PSYCH/NEURO: pleasant and cooperative, no obvious depression or anxiety, speech and thought processing grossly intact ? ?ASSESSMENT  AND PLAN: ?Conjunctivitis, treat with Tobradex drops.  ?Gershon Crane, MD ? ?Discussed the following assessment and plan: ? ?No diagnosis found. ? ? ?  ?I discussed the assessment and treatment plan with the patient. The patient was provided an opportunity to ask questions and all were answered. The patient agreed with the plan and demonstrated an understanding of the instructions. ?  ?The patient was advised to call back or seek an in-person evaluation if the symptoms worsen or if the condition fails to improve as anticipated. ? ?  ? ? ?Review of Systems ? ?   ?Objective:  ? Physical Exam ? ? ? ? ?   ?Assessment  & Plan:  ? ? ?

## 2022-01-27 DIAGNOSIS — I8312 Varicose veins of left lower extremity with inflammation: Secondary | ICD-10-CM | POA: Diagnosis not present

## 2022-01-27 DIAGNOSIS — I8311 Varicose veins of right lower extremity with inflammation: Secondary | ICD-10-CM | POA: Diagnosis not present

## 2022-02-02 DIAGNOSIS — J029 Acute pharyngitis, unspecified: Secondary | ICD-10-CM | POA: Diagnosis not present

## 2022-02-03 DIAGNOSIS — I8311 Varicose veins of right lower extremity with inflammation: Secondary | ICD-10-CM | POA: Diagnosis not present

## 2022-02-09 ENCOUNTER — Other Ambulatory Visit: Payer: Self-pay | Admitting: Family Medicine

## 2022-02-09 ENCOUNTER — Ambulatory Visit: Payer: BC Managed Care – PPO | Attending: Family Medicine | Admitting: Physical Therapy

## 2022-02-09 DIAGNOSIS — R32 Unspecified urinary incontinence: Secondary | ICD-10-CM | POA: Diagnosis not present

## 2022-02-09 DIAGNOSIS — I10 Essential (primary) hypertension: Secondary | ICD-10-CM

## 2022-02-09 DIAGNOSIS — R279 Unspecified lack of coordination: Secondary | ICD-10-CM

## 2022-02-09 DIAGNOSIS — M6281 Muscle weakness (generalized): Secondary | ICD-10-CM

## 2022-02-09 NOTE — Therapy (Signed)
?OUTPATIENT PHYSICAL THERAPY FEMALE PELVIC EVALUATION ? ? ?Patient Name: Alison [name] ?MRN: [id number] ?DOB:September 10, 1981, 41 y.o., female ?Today's Date: 02/09/2022 ? ? PT End of Session - 02/09/22 1531   ? ? Visit Number 1   ? Date for PT Re-Evaluation 05/04/22   ? PT Start Time 1150   ? PT Stop Time 1227   ? PT Time Calculation (min) 37 min   ? Activity Tolerance Patient tolerated treatment well   ? Behavior During Therapy [facility] for tasks assessed/performed   ? ?  ?  ? ?  ? ? ?Past Medical History:  ?Diagnosis Date  ? Anxiety and depression   ? Benign gestational thrombocytopenia (HCC) 12/03/2017  ? Cancer (HCC) 03/2014  ? basal carcinoma  ? Gestational hypertension 11/2011  ? Nephrolithiasis   ? Obstetrical laceration - bilateral perilabial splays 12/03/2017  ? Seasonal allergies   ? ?Past Surgical History:  ?Procedure Laterality Date  ? MOHS SURGERY  03/2014  ? BCC on nose.  ? ?Patient Active Problem List  ? Diagnosis Date Noted  ? Hypertension 12/20/2016  ? Other fatigue 12/20/2016  ? Precordial chest pain 12/20/2016  ? Seasonal allergies 12/28/2014  ? Anxiety and depression 12/28/2014  ? ? ?PCP: Wynn Banker, MD ? ?REFERRING PROVIDER: Wynn Banker, MD ? ?REFERRING DIAG: R32 (ICD-10-CM) - Incontinence in female ? ?THERAPY DIAG:  ?Muscle weakness (generalized) ? ?Unspecified lack of coordination ? ?ONSET DATE: 4 years ? ?SUBJECTIVE:                                                                                                                                                                                          ? ?SUBJECTIVE STATEMENT: ?I have had SI joint issues and leakage since my 2nd vaginal delivery.  I notice leakage when squatting and sometimes when using a tampon more leakage. ?Fluid intake: 6-8 glasses ? ?Patient confirms identification and approves PT to assess pelvic floor and treatment Yes ? ? ?PAIN:  ?Are you having pain? Yes (not currently but comes and goes) ?NPRS scale: 0/10 ?Pain location:  SI joint ? ?Pain type: aching ?Pain description: intermittent  ? ?Aggravating factors: last time it happened when tubing, yoga was hurting it ?Relieving factors: ibuprofen and rest for 3-4 days ? ?PRECAUTIONS: None ? ?WEIGHT BEARING RESTRICTIONS No ? ?FALLS:  ?Has patient fallen in last 6 months? No ? 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 ?PLOF: Independent ? ?PATIENT GOALS stop leakage and SI pain ? ?PERTINENT HISTORY:  ?2 vaginal deliveries ?Sexual abuse: No ? ?BOWEL MOVEMENT ?Pain  with bowel movement: No ?Type of bowel movement:Strain No ?Fully empty rectum: Yes:   ? ? ?URINATION ?Pain with urination: No ?Fully empty bladder: No ?Stream: Strong ?Urgency: No ?Frequency: normal ?Leakage: Lifting, Bending forward, and worse with tampon inserted ?Pads: No ? ?INTERCOURSE ?Pain with intercourse:  no ? ?Marinoff Scale: 0/3 ? ?PREGNANCY ?Vaginal deliveries 2 ?Tearing No ? ?PROLAPSE ?None ? ? ? ?OBJECTIVE:  ? ? ? ?COGNITION: ? Overall cognitive status: Within functional limits for tasks assessed   ?  ? ? ?MUSCLE LENGTH: ?Hamstrings: Right WFL deg; Left WFL  deg ?Thomas test: Right WFL  deg; Left WFL  deg ? ?LUMBAR SPECIAL TESTS:  ?ASLR test - negative ? ? ? ?GAIT: ?Comments: WFL ? ?POSTURE:  ?Increased anterior tilt and lumbar lordosis, fwd flexion Rt side less movement ? ?LUMBARAROM/PROM ? ?A/PROM A/PROM  ?02/09/2022  ?Flexion normal  ?Extension   ?Right lateral flexion   ?Left lateral flexion   ?Right rotation   ?Left rotation   ? (Blank rows = not tested) ? ?LE MMT: ? ?MMT Right ?02/09/2022 Left ?02/09/2022  ?Hip flexion    ?Hip extension    ?Hip abduction 4+/5 4/5  ?Hip adduction 5/5 5/5  ?Hip internal rotation    ?Hip external rotation    ?Knee flexion    ?Knee extension    ?Ankle dorsiflexion    ?Ankle plantarflexion    ?Ankle inversion    ?Ankle eversion    ? ?PELVIC MMT: ?  ?MMT  ?02/09/2022  ?Vaginal 2/5 weak squeeze x 5 sec   ?Internal Anal Sphincter   ?External Anal Sphincter   ?Puborectalis   ?Diastasis Recti   ?(Blank rows = not tested) ? ?      PALPATION: ?  General  DRA above umbilicus one finger width and 1 cm depth ? ?              External Perineal Exam normal ?              ?              Internal Pelvic Floor Rt levators TTP and tight, gluteal compensation and holding breath ? ?TONE: ?High on Rt side ? ?PROLAPSE: ?Posterior to hymen with valsalva ? ?TODAY'S TREATMENT  ?EVAL - initial HEP and self care ? ? ?PATIENT EDUCATION:  ?Education details: intiial HEP and toileting, foam noodle massage ?Person educated: Patient ?Education method: Explanation, Demonstration, and Handouts ?Education comprehension: verbalized understanding and returned demonstration ? ? ?HOME EXERCISE PROGRAM: ?Access Code: [email or web address] ?URL: [email or web address] ?Date: 02/09/2022 ?Prepared by: Dwana Curd ? ?Exercises ?- Supine Diaphragmatic Breathing  - 3 x daily - 7 x weekly - 1 sets - 10 reps ? ?ASSESSMENT: ? ?CLINICAL IMPRESSION: ?Patient is a 41 y.o. female who was seen today for physical therapy evaluation and treatment for bladder leakage. Pt demonstrates a lot of compensation when doing pelvic floor contraction.  She has a weak squeeze of 2/5.  There is tension of levators on the Rt and TTP coccygeus attachments.  Pt has posture deviations and core weakness as mentioned. Pt will benefit from skilled PT to address all above mentioned impairments and improve function. ? ? ? ?OBJECTIVE IMPAIRMENTS decreased coordination, decreased endurance, decreased strength, increased fascial restrictions, increased muscle spasms, impaired flexibility, impaired tone, postural dysfunction, and pain.  ? ?ACTIVITY LIMITATIONS community activity and exercise and recreational activities .  ? ?PERSONAL FACTORS 1-2 comorbidities: 2 vaginal deliveries  are also affecting patient's functional outcome.  ? ? ?REHAB  POTENTIAL: Excellent ? ?CLINICAL DECISION  MAKING: Evolving/moderate complexity ? ?EVALUATION COMPLEXITY: Moderate ? ? ?GOALS: ?Goals reviewed with patient? Yes ? ?SHORT TERM GOALS: Target date: 03/09/2022  (Remove Blue Hyperlink) ? ?Ind with initial HEP ?Baseline: ?Goal status: INITIAL ? ?2.  Pt will be able to isolate pelvic floor for kegel to strengthening muscles and coordinate correctly ?Baseline:  ?Goal status: INITIAL ? ? ? ?LONG TERM GOALS: Target date: 05/04/2022  (Remove Blue Hyperlink) ? ?Pt will be independent with advanced HEP to maintain improvements made throughout therapy ? ?Baseline:  ?Goal status: INITIAL ? ?2.  Pt will be able to functional actions such as bending and coughing without leakage ? ?Baseline:  ?Goal status: INITIAL ? ?3.  Pt will be able to lift at least 10 lb correctly for 10 reps without pain or leakage for functional activities  ?Baseline:  ?Goal status: INITIAL ? ?4.  Pt will report 80% reduction of pain due to improvements in posture, strength, and muscle length and be able to participate in yoga or other exercises ? ?Baseline:  ?Goal status: INITIAL ? ? ?PLAN: ?PT FREQUENCY: 1x/week ? ?PT DURATION: 12 weeks ? ?PLANNED INTERVENTIONS: Therapeutic exercises, Therapeutic activity, Neuromuscular re-education, Balance training, Gait training, Patient/Family education, Joint mobilization, Dry Needling, Electrical stimulation, Cryotherapy, Moist heat, Taping, Biofeedback, Manual therapy, and Re-evaluation ? ?PLAN FOR NEXT SESSION: internal STM to levators and breathing/stretches; basic core transversus abdominus strength ? ? ?Junious Silk, PT ?02/09/2022, 3:32 PM ? ?

## 2022-02-20 ENCOUNTER — Ambulatory Visit: Payer: BC Managed Care – PPO | Admitting: Physical Therapy

## 2022-02-20 DIAGNOSIS — R32 Unspecified urinary incontinence: Secondary | ICD-10-CM | POA: Diagnosis not present

## 2022-02-20 DIAGNOSIS — R279 Unspecified lack of coordination: Secondary | ICD-10-CM

## 2022-02-20 DIAGNOSIS — M6281 Muscle weakness (generalized): Secondary | ICD-10-CM

## 2022-02-20 NOTE — Therapy (Signed)
OUTPATIENT PHYSICAL THERAPY TREATMENT NOTE   Patient Name: Alison [name] MRN: [id number] DOB:01/31/1981, 41 y.o., female Today's Date: 02/20/2022  PCP: PCP: Wynn Banker, MD REFERRING PROVIDER:  Wynn Banker, MD  END OF SESSION:   PT End of Session - 02/20/22 0806     Visit Number 2    Date for PT Re-Evaluation 05/04/22    Authorization Type BCBS    PT Start Time 0802    PT Stop Time 0842    PT Time Calculation (min) 40 min    Activity Tolerance Patient tolerated treatment well    Behavior During Therapy [facility] for tasks assessed/performed             Past Medical History:  Diagnosis Date   Anxiety and depression    Benign gestational thrombocytopenia (HCC) 12/03/2017   Cancer (HCC) 03/2014   basal carcinoma   Gestational hypertension 11/2011   Nephrolithiasis    Obstetrical laceration - bilateral perilabial splays 12/03/2017   Seasonal allergies    Past Surgical History:  Procedure Laterality Date   MOHS SURGERY  03/2014   BCC on nose.   Patient Active Problem List   Diagnosis Date Noted   Hypertension 12/20/2016   Other fatigue 12/20/2016   Precordial chest pain 12/20/2016   Seasonal allergies 12/28/2014   Anxiety and depression 12/28/2014    REFERRING DIAG: R32 (ICD-10-CM) - Incontinence in female  THERAPY DIAG:  Muscle weakness (generalized)  Unspecified lack of coordination  Rationale for Evaluation and Treatment Rehabilitation  PERTINENT HISTORY: 2 vaginal deliveries  PRECAUTIONS: None  SUBJECTIVE: I was doing a lot of digging and lifting to work on the [employer] and my Lt buttocks is sore  PAIN:  Are you having pain? Yes: NPRS scale: 4/10 Pain location: Lt buttock Pain description: sore Aggravating factors: standing and walking Relieving factors: rest   OBJECTIVE: (objective measures completed at initial evaluation unless otherwise dated)  MUSCLE LENGTH: Hamstrings: Right WFL deg; Left WFL  deg Maisie Fus test: Right WFL  deg;  Left WFL  deg   LUMBAR SPECIAL TESTS:  ASLR test - negative       GAIT: Comments: WFL   POSTURE:  Increased anterior tilt and lumbar lordosis, fwd flexion Rt side less movement   LUMBARAROM/PROM   A/PROM A/PROM  02/09/2022  Flexion normal  Extension    Right lateral flexion    Left lateral flexion    Right rotation    Left rotation     (Blank rows = not tested)   LE MMT:   MMT Right 02/09/2022 Left 02/09/2022  Hip flexion      Hip extension      Hip abduction 4+/5 4/5  Hip adduction 5/5 5/5  Hip internal rotation      Hip external rotation      Knee flexion      Knee extension      Ankle dorsiflexion      Ankle plantarflexion      Ankle inversion      Ankle eversion        PELVIC MMT:   MMT   02/09/2022  Vaginal 2/5 weak squeeze x 5 sec  Internal Anal Sphincter    External Anal Sphincter    Puborectalis    Diastasis Recti    (Blank rows = not tested)         PALPATION:   General  DRA above umbilicus one finger width and 1 cm depth  External Perineal Exam normal                             Internal Pelvic Floor Rt levators TTP and tight, gluteal compensation and holding breath   TONE: High on Rt side   PROLAPSE: Posterior and anterior to hymen with valsalva   TODAY'S TREATMENT  Treatment:02/20/22 Exercises  See education stretched - all performed in session Manual Patient confirms identification and approves physical therapist to perform internal soft tissue work  STM to left more than rigth pelvic floor - obdurator internus and coccygeus Fascial release around bladder internally and externally Nuero Re-ed Education and cues for coordination of breathing and relaxing at appropriate times    PATIENT EDUCATION:  Education details: Access Code: [email or web address] Education method: Explanation, Facilities manager, and Handouts Education comprehension: verbalized understanding and returned demonstration     HOME EXERCISE PROGRAM: Access Code:  [email or web address] URL: [email or web address] Date: 02/20/2022 Prepared by: Dwana Curd  Exercises - Supine Diaphragmatic Breathing  - 3 x daily - 7 x weekly - 1 sets - 10 reps - Happy Baby with Pelvic Floor Lengthening  - 1 x daily - 7 x weekly - 1 sets - 3 reps - 30 hold - Diaphragmatic Breathing in Child's Pose with Pelvic Floor Relaxation  - 1 x daily - 7 x weekly - 1 sets - 5 reps - 30 sec hold - Child's Pose with Thread the Needle  - 1 x daily - 7 x weekly - 1 sets - 3 reps - World's Greatest Stretch - Lunge with Ipsilateral Thoracic Rotation  - 1 x daily - 7 x weekly - 1 sets - 3 reps - Pigeon Pose  - 1 x daily - 7 x weekly - 1 sets - 3 reps ASSESSMENT:   CLINICAL IMPRESSION: Patient did well with STM and had a release of left OI and coccygeus that helped relieve the buttock pain she was feeling upon arrival.  Pt was given stretches to work on maintaining improvements in soft tissue length.  Pt will benefit from skilled PT to continue to work on improved soft tissue flexibility and strength for functional outcomes as stated in goals.       OBJECTIVE IMPAIRMENTS decreased coordination, decreased endurance, decreased strength, increased fascial restrictions, increased muscle spasms, impaired flexibility, impaired tone, postural dysfunction, and pain.    ACTIVITY LIMITATIONS community activity and exercise and recreational activities .    PERSONAL FACTORS 1-2 comorbidities: 2 vaginal deliveries  are also affecting patient's functional outcome.      REHAB POTENTIAL: Excellent   CLINICAL DECISION MAKING: Evolving/moderate complexity   EVALUATION COMPLEXITY: Moderate     GOALS: Goals reviewed with patient? Yes   SHORT TERM GOALS: Target date: 03/09/2022  (Remove Blue Hyperlink), updated 02/20/22    Ind with initial HEP Baseline: Goal status: met   2.  Pt will be able to isolate pelvic floor for kegel to strengthening muscles and coordinate correctly Baseline:   Goal status: ongoing       LONG TERM GOALS: Target date: 05/04/2022  (Remove Blue Hyperlink)   Pt will be independent with advanced HEP to maintain improvements made throughout therapy   Baseline:  Goal status: INITIAL   2.  Pt will be able to functional actions such as bending and coughing without leakage   Baseline:  Goal status: INITIAL   3.  Pt will be able to lift at least 10 lb correctly for 10 reps without  pain or leakage for functional activities  Baseline:  Goal status: INITIAL   4.  Pt will report 80% reduction of pain due to improvements in posture, strength, and muscle length and be able to participate in yoga or other exercises   Baseline:  Goal status: INITIAL     PLAN: PT FREQUENCY: 1x/week   PT DURATION: 12 weeks   PLANNED INTERVENTIONS: Therapeutic exercises, Therapeutic activity, Neuromuscular re-education, Balance training, Gait training, Patient/Family education, Joint mobilization, Dry Needling, Electrical stimulation, Cryotherapy, Moist heat, Taping, Biofeedback, Manual therapy, and Re-evaluation   PLAN FOR NEXT SESSION: internal STM to left OI and coccygeus f/u on breathing/stretches; basic core transversus abdominus strength    Brayton Caves Joi Leyva, PT 02/20/2022, 8:26 AM

## 2022-03-05 NOTE — Therapy (Signed)
OUTPATIENT PHYSICAL THERAPY TREATMENT NOTE   Patient Name: Alison [name] MRN: [id number] DOB:1981/05/29, 41 y.o., female Today's Date: 03/06/2022  PCP: PCP: Wynn Banker, MD REFERRING PROVIDER:  Wynn Banker, MD  END OF SESSION:   PT End of Session - 03/06/22 1712     Visit Number 3    Date for PT Re-Evaluation 05/04/22    Authorization Type BCBS    PT Start Time 0933    PT Stop Time 1011    PT Time Calculation (min) 38 min    Activity Tolerance Patient tolerated treatment well    Behavior During Therapy [facility] for tasks assessed/performed              Past Medical History:  Diagnosis Date   Anxiety and depression    Benign gestational thrombocytopenia (HCC) 12/03/2017   Cancer (HCC) 03/2014   basal carcinoma   Gestational hypertension 11/2011   Nephrolithiasis    Obstetrical laceration - bilateral perilabial splays 12/03/2017   Seasonal allergies    Past Surgical History:  Procedure Laterality Date   MOHS SURGERY  03/2014   BCC on nose.   Patient Active Problem List   Diagnosis Date Noted   Hypertension 12/20/2016   Other fatigue 12/20/2016   Precordial chest pain 12/20/2016   Seasonal allergies 12/28/2014   Anxiety and depression 12/28/2014    REFERRING DIAG: R32 (ICD-10-CM) - Incontinence in female  THERAPY DIAG:  Unspecified lack of coordination  Muscle weakness (generalized)  Rationale for Evaluation and Treatment Rehabilitation  PERTINENT HISTORY: 2 vaginal deliveries  PRECAUTIONS: None  SUBJECTIVE: I am about the same with the leakage.  When I bend and lift, cough, or sneeze.  PAIN:  Are you having pain? Yes: NPRS scale: 4/10 Pain location: Lt buttock Pain description: sore Aggravating factors: standing and walking Relieving factors: rest   OBJECTIVE: (objective measures completed at initial evaluation unless otherwise dated)  MUSCLE LENGTH: Hamstrings: Right WFL deg; Left WFL  deg Maisie Fus test: Right WFL  deg; Left WFL  deg    LUMBAR SPECIAL TESTS:  ASLR test - negative       GAIT: Comments: WFL   POSTURE:  Increased anterior tilt and lumbar lordosis, fwd flexion Rt side less movement   LUMBARAROM/PROM   A/PROM A/PROM  02/09/2022  Flexion normal  Extension    Right lateral flexion    Left lateral flexion    Right rotation    Left rotation     (Blank rows = not tested)   LE MMT:   MMT Right 02/09/2022 Left 02/09/2022  Hip flexion      Hip extension      Hip abduction 4+/5 4/5  Hip adduction 5/5 5/5  Hip internal rotation      Hip external rotation      Knee flexion      Knee extension      Ankle dorsiflexion      Ankle plantarflexion      Ankle inversion      Ankle eversion        PELVIC MMT:   MMT   02/09/2022  Vaginal 2/5 weak squeeze x 5 sec  Internal Anal Sphincter    External Anal Sphincter    Puborectalis    Diastasis Recti    (Blank rows = not tested)         PALPATION:   General  DRA above umbilicus one finger width and 1 cm depth  External Perineal Exam normal                             Internal Pelvic Floor Rt levators TTP and tight, gluteal compensation and holding breath   TONE: High on Rt side   PROLAPSE: Posterior and anterior to hymen with valsalva   TODAY'S TREATMENT  Treatment:6/9//23 Exercises and Neuro re-ed: Standing staggered and neutral, toes in - kegel all ways Quadruped on mat and on ball - UE reaches; kegel with hip IR Squats with various cues to activate core and pelvic floor more - rotation of feet, lifting arches  Manual: Patient confirms identification and approves physical therapist to perform internal soft tissue work  STM to pelvic floor - obdurator internus and coccygeus  Nuero Re-ed: Education and cues for coordination of breathing and relaxing at appropriate times - pelvic floor isolating exercises  Treatment:02/20/22 Exercises  See education stretched - all performed in session Manual Patient confirms  identification and approves physical therapist to perform internal soft tissue work  STM to left more than rigth pelvic floor - obdurator internus and coccygeus Fascial release around bladder internally and externally Nuero Re-ed Education and cues for coordination of breathing and relaxing at appropriate times    PATIENT EDUCATION:  Education details: Access Code: [email or web address] Education method: Explanation, Facilities manager, and Handouts Education comprehension: verbalized understanding and returned demonstration     HOME EXERCISE PROGRAM: Access Code: [email or web address] URL: [email or web address] Date: 02/20/2022 Prepared by: Dwana Curd  Exercises - Supine Diaphragmatic Breathing  - 3 x daily - 7 x weekly - 1 sets - 10 reps - Happy Baby with Pelvic Floor Lengthening  - 1 x daily - 7 x weekly - 1 sets - 3 reps - 30 hold - Diaphragmatic Breathing in Child's Pose with Pelvic Floor Relaxation  - 1 x daily - 7 x weekly - 1 sets - 5 reps - 30 sec hold - Child's Pose with Thread the Needle  - 1 x daily - 7 x weekly - 1 sets - 3 reps - World's Greatest Stretch - Lunge with Ipsilateral Thoracic Rotation  - 1 x daily - 7 x weekly - 1 sets - 3 reps - Pigeon Pose  - 1 x daily - 7 x weekly - 1 sets - 3 reps ASSESSMENT:   CLINICAL IMPRESSION: Patient had improved muscle contraction when the coccygeus and obdurator release during STM.  Pt was educated on multiple ways to isolate the pelvic floor and then how to cue with squatting.  Pt did well with exercises but still needing to think a lot while performing exercises.  Pt will benefit from skilled PT to continue to work on improved soft tissue flexibility and strength for functional outcomes as stated in goals.       OBJECTIVE IMPAIRMENTS decreased coordination, decreased endurance, decreased strength, increased fascial restrictions, increased muscle spasms, impaired flexibility, impaired tone, postural dysfunction, and pain.    ACTIVITY  LIMITATIONS community activity and exercise and recreational activities .    PERSONAL FACTORS 1-2 comorbidities: 2 vaginal deliveries  are also affecting patient's functional outcome.      REHAB POTENTIAL: Excellent   CLINICAL DECISION MAKING: Evolving/moderate complexity   EVALUATION COMPLEXITY: Moderate     GOALS: Goals reviewed with patient? Yes   SHORT TERM GOALS: Target date: 03/09/2022  (Remove Blue Hyperlink),  updated 03/06/22    Ind with initial HEP Baseline: Goal status: met   2.  Pt will  be able to isolate pelvic floor for kegel to strengthening muscles and coordinate correctly Baseline:  Goal status: Met 03/06/22        LONG TERM GOALS: Target date: 05/04/2022  (Remove Blue Hyperlink)  updated:  Pt will be independent with advanced HEP to maintain improvements made throughout therapy   Baseline:  Goal status: INITIAL   2.  Pt will be able to functional actions such as bending and coughing without leakage   Baseline:  Goal status: INITIAL   3.  Pt will be able to lift at least 10 lb correctly for 10 reps without pain or leakage for functional activities  Baseline:  Goal status: INITIAL   4.  Pt will report 80% reduction of pain due to improvements in posture, strength, and muscle length and be able to participate in yoga or other exercises   Baseline:  Goal status: INITIAL     PLAN: PT FREQUENCY: 1x/week   PT DURATION: 12 weeks   PLANNED INTERVENTIONS: Therapeutic exercises, Therapeutic activity, Neuromuscular re-education, Balance training, Gait training, Patient/Family education, Joint mobilization, Dry Needling, Electrical stimulation, Cryotherapy, Moist heat, Taping, Biofeedback, Manual therapy, and Re-evaluation   PLAN FOR NEXT SESSION: f/u on strengthening exercises; breathing/stretches to gluteals; basic core transversus abdominus strength    Brayton Caves Ahan Eisenberger, PT 03/06/2022, 5:17 PM

## 2022-03-06 ENCOUNTER — Ambulatory Visit: Payer: BC Managed Care – PPO | Attending: Family Medicine | Admitting: Physical Therapy

## 2022-03-06 ENCOUNTER — Encounter: Payer: Self-pay | Admitting: Physical Therapy

## 2022-03-06 DIAGNOSIS — M6281 Muscle weakness (generalized): Secondary | ICD-10-CM | POA: Diagnosis not present

## 2022-03-06 DIAGNOSIS — R279 Unspecified lack of coordination: Secondary | ICD-10-CM | POA: Diagnosis not present

## 2022-03-10 ENCOUNTER — Encounter: Payer: BC Managed Care – PPO | Admitting: Physical Therapy

## 2022-03-12 ENCOUNTER — Ambulatory Visit: Payer: BC Managed Care – PPO | Admitting: Physical Therapy

## 2022-03-19 ENCOUNTER — Ambulatory Visit: Payer: BC Managed Care – PPO | Admitting: Physical Therapy

## 2022-03-19 DIAGNOSIS — R279 Unspecified lack of coordination: Secondary | ICD-10-CM | POA: Diagnosis not present

## 2022-03-19 DIAGNOSIS — M6281 Muscle weakness (generalized): Secondary | ICD-10-CM

## 2022-03-19 NOTE — Therapy (Signed)
OUTPATIENT PHYSICAL THERAPY TREATMENT NOTE   Patient Name: Alison [name] MRN: [id number] DOB:[birth year], 41 y.o., female Today's Date: 03/20/2022  PCP: PCP: Wynn Banker, MD REFERRING PROVIDER:  Wynn Banker, MD  END OF SESSION:   PT End of Session - 03/20/22 0848     Visit Number 4    Date for PT Re-Evaluation 05/04/22    Authorization Type BCBS    PT Start Time 1400    PT Stop Time 1440    PT Time Calculation (min) 40 min    Activity Tolerance Patient tolerated treatment well    Behavior During Therapy [facility] for tasks assessed/performed               Past Medical History:  Diagnosis Date   Anxiety and depression    Benign gestational thrombocytopenia (HCC) 12/03/2017   Cancer (HCC) 03/2014   basal carcinoma   Gestational hypertension 11/2011   Nephrolithiasis    Obstetrical laceration - bilateral perilabial splays 12/03/2017   Seasonal allergies    Past Surgical History:  Procedure Laterality Date   MOHS SURGERY  03/2014   BCC on nose.   Patient Active Problem List   Diagnosis Date Noted   Hypertension 12/20/2016   Other fatigue 12/20/2016   Precordial chest pain 12/20/2016   Seasonal allergies 12/28/2014   Anxiety and depression 12/28/2014    REFERRING DIAG: R32 (ICD-10-CM) - Incontinence in female  THERAPY DIAG:  Unspecified lack of coordination  Muscle weakness (generalized)  Rationale for Evaluation and Treatment Rehabilitation  PERTINENT HISTORY: 2 vaginal deliveries  PRECAUTIONS: None  SUBJECTIVE: I am about the same with the leakage.  When I bend or lift. Less pain since last time  PAIN:  Are you having pain? No   OBJECTIVE: (objective measures completed at initial evaluation unless otherwise dated)  MUSCLE LENGTH: Hamstrings: Right WFL deg; Left WFL  deg Maisie Fus test: Right WFL  deg; Left WFL  deg   LUMBAR SPECIAL TESTS:  ASLR test - negative       GAIT: Comments: WFL   POSTURE:  Increased anterior tilt and lumbar  lordosis, fwd flexion Rt side less movement   LUMBARAROM/PROM   A/PROM A/PROM  02/09/2022  Flexion normal  Extension    Right lateral flexion    Left lateral flexion    Right rotation    Left rotation     (Blank rows = not tested)   LE MMT:   MMT Right 02/09/2022 Left 02/09/2022  Hip flexion      Hip extension      Hip abduction 4+/5 4/5  Hip adduction 5/5 5/5  Hip internal rotation      Hip external rotation      Knee flexion      Knee extension      Ankle dorsiflexion      Ankle plantarflexion      Ankle inversion      Ankle eversion        PELVIC MMT:   MMT   02/09/2022  Vaginal 2/5 weak squeeze x 5 sec  Internal Anal Sphincter    External Anal Sphincter    Puborectalis    Diastasis Recti    (Blank rows = not tested)         PALPATION:   General  DRA above umbilicus one finger width and 1 cm depth                 External Perineal Exam normal  Internal Pelvic Floor Rt levators TTP and tight, gluteal compensation and holding breath   TONE: High on Rt side   PROLAPSE: Posterior and anterior to hymen with valsalva   TODAY'S TREATMENT  Treatment:03/19/22 Exercises and Neuro re-ed: Hip abduction standing blue band Woodpeckers holding on to the chair Side and fwd lunge with BOSU - needs some UE support Prone kegel on pball and on wedge pillow Pball UE reaches and LE reches 20x Side lying hip abduction 20x Hollow holds - 5 x 20 sec  Nuero Re-ed: Education and cues for coordination of breathing and relaxing at appropriate times - pelvic floor isolating exercises  Treatment:6/9//23 Exercises and Neuro re-ed: Standing staggered and neutral, toes in - kegel all ways Quadruped on mat and on ball - UE reaches; kegel with hip IR Squats with various cues to activate core and pelvic floor more - rotation of feet, lifting arches  Manual: Patient confirms identification and approves physical therapist to perform internal soft tissue  work  STM to pelvic floor - obdurator internus and coccygeus  Nuero Re-ed: Education and cues for coordination of breathing and relaxing at appropriate times - pelvic floor isolating exercises  Treatment:02/20/22 Exercises  See education stretched - all performed in session Manual Patient confirms identification and approves physical therapist to perform internal soft tissue work  STM to left more than rigth pelvic floor - obdurator internus and coccygeus Fascial release around bladder internally and externally Nuero Re-ed Education and cues for coordination of breathing and relaxing at appropriate times    PATIENT EDUCATION:  Education details: Access Code: [email or web address] Education method: Explanation, Facilities manager, and Handouts Education comprehension: verbalized understanding and returned demonstration     HOME EXERCISE PROGRAM: Access Code: [email or web address] URL: [email or web address] Date: 03/20/2022 Prepared by: Dwana Curd  Exercises - Supine Diaphragmatic Breathing  - 3 x daily - 7 x weekly - 1 sets - 10 reps - Happy Baby with Pelvic Floor Lengthening  - 1 x daily - 7 x weekly - 1 sets - 3 reps - 30 hold - Diaphragmatic Breathing in Child's Pose with Pelvic Floor Relaxation  - 1 x daily - 7 x weekly - 1 sets - 5 reps - 30 sec hold - Child's Pose with Thread the Needle  - 1 x daily - 7 x weekly - 1 sets - 3 reps - World's Greatest Stretch - Lunge with Ipsilateral Thoracic Rotation  - 1 x daily - 7 x weekly - 1 sets - 3 reps - Pigeon Pose  - 1 x daily - 7 x weekly - 1 sets - 3 reps - Quadruped Alternating Arm Lift  - 1 x daily - 7 x weekly - 2 sets - 10 reps - Isometric Dead Bug  - 1 x daily - 7 x weekly - 3 sets - 10 reps - Sidelying Hip Abduction  - 1 x daily - 7 x weekly - 3 sets - 10 reps ASSESSMENT:   CLINICAL IMPRESSION: Patient had improved progress with pain when lifting.  Pt is experiencing 50% less pain overall.  Pt was able to progress core strength  today.  Pt did well with hollow hold for 20 sec knees are bent.  Pt did not notice changes in leakage and will benefit from skilled PT to continue working on strength and coordination.       OBJECTIVE IMPAIRMENTS decreased coordination, decreased endurance, decreased strength, increased fascial restrictions, increased muscle spasms, impaired flexibility, impaired tone, postural dysfunction, and pain.    ACTIVITY LIMITATIONS  community activity and exercise and recreational activities .    PERSONAL FACTORS 1-2 comorbidities: 2 vaginal deliveries  are also affecting patient's functional outcome.      REHAB POTENTIAL: Excellent   CLINICAL DECISION MAKING: Evolving/moderate complexity   EVALUATION COMPLEXITY: Moderate     GOALS: Goals reviewed with patient? Yes   SHORT TERM GOALS: Target date: 03/09/2022  (Remove Blue Hyperlink),  updated 03/06/22    Ind with initial HEP Baseline: Goal status: met   2.  Pt will be able to isolate pelvic floor for kegel to strengthening muscles and coordinate correctly Baseline:  Goal status: Met 03/06/22        LONG TERM GOALS: Target date: 05/04/2022  (Remove Blue Hyperlink)  updated: 03/19/22 Pt will be independent with advanced HEP to maintain improvements made throughout therapy   Baseline:  Goal status: Ongoing   2.  Pt will be able to functional actions such as bending and coughing without leakage   Baseline:  Goal status: Ongoing   3.  Pt will be able to lift at least 10 lb correctly for 10 reps without pain or leakage for functional activities  Baseline:  Goal status: Ongoing   4.  Pt will report 80% reduction of pain due to improvements in posture, strength, and muscle length and be able to participate in yoga or other exercises   Baseline: 50% improved Goal status: Ongoing     PLAN: PT FREQUENCY: 1x/week   PT DURATION: 12 weeks   PLANNED INTERVENTIONS: Therapeutic exercises, Therapeutic activity, Neuromuscular  re-education, Balance training, Gait training, Patient/Family education, Joint mobilization, Dry Needling, Electrical stimulation, Cryotherapy, Moist heat, Taping, Biofeedback, Manual therapy, and Re-evaluation   PLAN FOR NEXT SESSION: f/u on strengthening exercises hollow holds and hip abduction and rotations with band; breathing/stretches to gluteals; eccentric pelvic floor, biofeedback?    Brayton Caves Rydge [place], PT 03/20/2022, 8:50 AM

## 2022-03-20 ENCOUNTER — Encounter: Payer: Self-pay | Admitting: Physical Therapy

## 2022-03-24 ENCOUNTER — Ambulatory Visit: Payer: BC Managed Care – PPO | Admitting: Physical Therapy

## 2022-03-24 ENCOUNTER — Encounter: Payer: Self-pay | Admitting: Physical Therapy

## 2022-03-24 DIAGNOSIS — R279 Unspecified lack of coordination: Secondary | ICD-10-CM | POA: Diagnosis not present

## 2022-03-24 DIAGNOSIS — M6281 Muscle weakness (generalized): Secondary | ICD-10-CM | POA: Diagnosis not present

## 2022-03-24 NOTE — Therapy (Signed)
OUTPATIENT PHYSICAL THERAPY TREATMENT NOTE   Patient Name: Alison [name] MRN: [id number] DOB:Mar 01, 1981, 41 y.o., female Today's Date: 03/24/2022  PCP: PCP: Wynn Banker, MD REFERRING PROVIDER:  Wynn Banker, MD  END OF SESSION:   PT End of Session - 03/24/22 1401     Visit Number 5    Date for PT Re-Evaluation 05/04/22    Authorization Type BCBS    PT Start Time 1401    PT Stop Time 1443    PT Time Calculation (min) 42 min    Activity Tolerance Patient tolerated treatment well    Behavior During Therapy WFL for tasks assessed/performed                Past Medical History:  Diagnosis Date   Anxiety and depression    Benign gestational thrombocytopenia (HCC) 12/03/2017   Cancer (HCC) 03/2014   basal carcinoma   Gestational hypertension 11/2011   Nephrolithiasis    Obstetrical laceration - bilateral perilabial splays 12/03/2017   Seasonal allergies    Past Surgical History:  Procedure Laterality Date   MOHS SURGERY  03/2014   BCC on nose.   Patient Active Problem List   Diagnosis Date Noted   Hypertension 12/20/2016   Other fatigue 12/20/2016   Precordial chest pain 12/20/2016   Seasonal allergies 12/28/2014   Anxiety and depression 12/28/2014    REFERRING DIAG: R32 (ICD-10-CM) - Incontinence in female  THERAPY DIAG:  Unspecified lack of coordination  Muscle weakness (generalized)  Rationale for Evaluation and Treatment Rehabilitation  PERTINENT HISTORY: 2 vaginal deliveries  PRECAUTIONS: None  SUBJECTIVE: I am about the same with the leakage.  When I bend or lift. Less pain since last time  PAIN:  Are you having pain? No   OBJECTIVE: (objective measures completed at initial evaluation unless otherwise dated)  MUSCLE LENGTH: Hamstrings: Right WFL deg; Left WFL  deg Maisie Fus test: Right WFL  deg; Left WFL  deg   LUMBAR SPECIAL TESTS:  ASLR test - negative       GAIT: Comments: WFL   POSTURE:  Increased anterior tilt and  lumbar lordosis, fwd flexion Rt side less movement   LUMBARAROM/PROM   A/PROM A/PROM  02/09/2022  Flexion normal  Extension    Right lateral flexion    Left lateral flexion    Right rotation    Left rotation     (Blank rows = not tested)   LE MMT:   MMT Right 02/09/2022 Left 02/09/2022  Hip flexion      Hip extension      Hip abduction 4+/5 4/5  Hip adduction 5/5 5/5  Hip internal rotation      Hip external rotation      Knee flexion      Knee extension      Ankle dorsiflexion      Ankle plantarflexion      Ankle inversion      Ankle eversion        PELVIC MMT:   MMT   02/09/2022  Vaginal 2/5 weak squeeze x 5 sec  Internal Anal Sphincter    External Anal Sphincter    Puborectalis    Diastasis Recti    (Blank rows = not tested)         PALPATION:   General  DRA above umbilicus one finger width and 1 cm depth                 External Perineal Exam normal  Internal Pelvic Floor Rt levators TTP and tight, gluteal compensation and holding breath   TONE: High on Rt side   PROLAPSE: Posterior and anterior to hymen with valsalva   TODAY'S TREATMENT  Treatment:03/24/22 Exercises and Neuro re-ed: Hip flexion on step Clam on Rt side (increased pain and tension) Step down 10x bil Supine on foam roller - alt LE Primal push up Qped - UE alternating raises Pball under forearms in kneeling plank - rolling out and back; side to side - 2x 10   Nuero Re-ed: Education and cues for coordination of breathing and relaxing at appropriate times - pelvic floor isolating exercises  Manual Rt gluteals and lumbar paraspinals Trigger Point Dry-Needling  Treatment instructions: Expect mild to moderate muscle soreness. S/S of pneumothorax if dry needled over a lung field, and to seek immediate medical attention should they occur. Patient verbalized understanding of these instructions and education.  Patient Consent Given: Yes Education handout  provided: Yes Muscles treated: lumbar multifidi and gluteals on the right Electrical stimulation performed: No Parameters: N/A Treatment response/outcome: improved soft tissue length   Treatment:03/19/22 Exercises and Neuro re-ed: Hip abduction standing blue band Woodpeckers holding on to the chair Side and fwd lunge with BOSU - needs some UE support Prone kegel on pball and on wedge pillow Pball UE reaches and LE reches 20x Side lying hip abduction 20x Hollow holds - 5 x 20 sec  Nuero Re-ed: Education and cues for coordination of breathing and relaxing at appropriate times - pelvic floor isolating exercises  Treatment:6/9//23 Exercises and Neuro re-ed: Standing staggered and neutral, toes in - kegel all ways Quadruped on mat and on ball - UE reaches; kegel with hip IR Squats with various cues to activate core and pelvic floor more - rotation of feet, lifting arches  Manual: Patient confirms identification and approves physical therapist to perform internal soft tissue work  STM to pelvic floor - obdurator internus and coccygeus  Nuero Re-ed: Education and cues for coordination of breathing and relaxing at appropriate times - pelvic floor isolating exercises      PATIENT EDUCATION:  Education details: Access Code: [email or web address] Education method: Explanation, Facilities manager, and Handouts Education comprehension: verbalized understanding and returned demonstration     HOME EXERCISE PROGRAM: Access Code: [email or web address] URL: [email or web address] Date: 03/20/2022 Prepared by: Dwana Curd  Exercises - Supine Diaphragmatic Breathing  - 3 x daily - 7 x weekly - 1 sets - 10 reps - Happy Baby with Pelvic Floor Lengthening  - 1 x daily - 7 x weekly - 1 sets - 3 reps - 30 hold - Diaphragmatic Breathing in Child's Pose with Pelvic Floor Relaxation  - 1 x daily - 7 x weekly - 1 sets - 5 reps - 30 sec hold - Child's Pose with Thread the Needle  - 1 x daily - 7 x weekly  - 1 sets - 3 reps - World's Greatest Stretch - Lunge with Ipsilateral Thoracic Rotation  - 1 x daily - 7 x weekly - 1 sets - 3 reps - Pigeon Pose  - 1 x daily - 7 x weekly - 1 sets - 3 reps - Quadruped Alternating Arm Lift  - 1 x daily - 7 x weekly - 2 sets - 10 reps - Isometric Dead Bug  - 1 x daily - 7 x weekly - 3 sets - 10 reps - Sidelying Hip Abduction  - 1 x daily - 7 x weekly - 3 sets - 10 reps ASSESSMENT:   CLINICAL  IMPRESSION: Patient had less pain after manual and dry needling.  She was getting SI joint flare ups with clam.  She was given additional core strengthening exercises today as that is helping stabilize the pelvis and she has experienced less leakage this week.  Pt did not notice changes in leakage and will benefit from skilled PT to continue working on strength and coordination.       OBJECTIVE IMPAIRMENTS decreased coordination, decreased endurance, decreased strength, increased fascial restrictions, increased muscle spasms, impaired flexibility, impaired tone, postural dysfunction, and pain.    ACTIVITY LIMITATIONS community activity and exercise and recreational activities .    PERSONAL FACTORS 1-2 comorbidities: 2 vaginal deliveries  are also affecting patient's functional outcome.      REHAB POTENTIAL: Excellent   CLINICAL DECISION MAKING: Evolving/moderate complexity   EVALUATION COMPLEXITY: Moderate     GOALS: Goals reviewed with patient? Yes   SHORT TERM GOALS: Target date: 03/09/2022  (Remove Blue Hyperlink),  updated 03/06/22    Ind with initial HEP Baseline: Goal status: met   2.  Pt will be able to isolate pelvic floor for kegel to strengthening muscles and coordinate correctly Baseline:  Goal status: Met 03/06/22        LONG TERM GOALS: Target date: 05/04/2022  (Remove Blue Hyperlink)  updated: 03/19/22 Pt will be independent with advanced HEP to maintain improvements made throughout therapy   Baseline:  Goal status: Ongoing   2.  Pt  will be able to functional actions such as bending and coughing without leakage   Baseline:  Goal status: Ongoing   3.  Pt will be able to lift at least 10 lb correctly for 10 reps without pain or leakage for functional activities  Baseline:  Goal status: Ongoing   4.  Pt will report 80% reduction of pain due to improvements in posture, strength, and muscle length and be able to participate in yoga or other exercises   Baseline: 50% improved Goal status: Ongoing     PLAN: PT FREQUENCY: 1x/week   PT DURATION: 12 weeks   PLANNED INTERVENTIONS: Therapeutic exercises, Therapeutic activity, Neuromuscular re-education, Balance training, Gait training, Patient/Family education, Joint mobilization, Dry Needling, Electrical stimulation, Cryotherapy, Moist heat, Taping, Biofeedback, Manual therapy, and Re-evaluation   PLAN FOR NEXT SESSION: f/u on strengthening exercises core on pball and continue DN#2 to gluteals as needed; core progression and gentle glute med strength as able    [employer], PT 03/24/2022, 2:01 PM

## 2022-04-02 ENCOUNTER — Ambulatory Visit: Payer: BC Managed Care – PPO | Admitting: Physical Therapy

## 2022-04-23 ENCOUNTER — Ambulatory Visit: Payer: BC Managed Care – PPO | Admitting: Physical Therapy

## 2022-05-13 NOTE — Therapy (Signed)
OUTPATIENT PHYSICAL THERAPY TREATMENT NOTE   Patient Name: Alison [name] MRN: [id number] DOB:1981/01/19, 41 y.o., female Today's Date: 05/14/2022  PCP: PCP: Wynn Banker, MD REFERRING PROVIDER:  Wynn Banker, MD  END OF SESSION:   PT End of Session - 05/14/22 1417     Visit Number 6    Date for PT Re-Evaluation 08/06/22    Authorization Type BC[employer]    PT Start Time 1400    PT Stop Time 1445    PT Time Calculation (min) 45 min    Activity Tolerance Patient tolerated treatment well    Behavior During Therapy [facility] for tasks assessed/performed                 Past Medical History:  Diagnosis Date   Anxiety and depression    Benign gestational thrombocytopenia (HCC) 12/03/2017   Cancer (HCC) 03/2014   basal carcinoma   Gestational hypertension 11/2011   Nephrolithiasis    Obstetrical laceration - bilateral perilabial splays 12/03/2017   Seasonal allergies    Past Surgical History:  Procedure Laterality Date   MOHS SURGERY  03/2014   BCC on nose.   Patient Active Problem List   Diagnosis Date Noted   Hypertension 12/20/2016   Other fatigue 12/20/2016   Precordial chest pain 12/20/2016   Seasonal allergies 12/28/2014   Anxiety and depression 12/28/2014    REFERRING DIAG: R32 (ICD-10-CM) - Incontinence in female  THERAPY DIAG:  Unspecified lack of coordination  Muscle weakness (generalized)  Rationale for Evaluation and Treatment Rehabilitation  PERTINENT HISTORY: 2 vaginal deliveries  PRECAUTIONS: None  SUBJECTIVE: I think the leakage is better but the SI  PAIN:  Are you having pain? No   OBJECTIVE: (objective measures completed at initial evaluation unless otherwise dated)  MUSCLE LENGTH: Hamstrings: Right WFL deg; Left WFL  deg Maisie Fus test: Right WFL  deg; Left WFL  deg   LUMBAR SPECIAL TESTS:  ASLR test - negative       GAIT: Comments: WFL   POSTURE:  Increased anterior tilt and lumbar lordosis, fwd flexion Rt side less  movement   LUMBARAROM/PROM   A/PROM A/PROM  02/09/2022  Flexion normal  Extension    Right lateral flexion    Left lateral flexion    Right rotation    Left rotation     (Blank rows = not tested)   LE MMT:   MMT Right 02/09/2022 Left 02/09/2022  Hip flexion      Hip extension      Hip abduction 4+/5 4/5  Hip adduction 5/5 5/5  Hip internal rotation      Hip external rotation      Knee flexion      Knee extension      Ankle dorsiflexion      Ankle plantarflexion      Ankle inversion      Ankle eversion        PELVIC MMT:   MMT   02/09/2022  Vaginal 2/5 weak squeeze x 5 sec  Internal Anal Sphincter    External Anal Sphincter    Puborectalis    Diastasis Recti    (Blank rows = not tested)         PALPATION:   General  DRA above umbilicus one finger width and 1 cm depth                 External Perineal Exam normal  Internal Pelvic Floor Rt levators TTP and tight, gluteal compensation and holding breath   TONE: High on Rt side   PROLAPSE: Posterior and anterior to hymen with valsalva  Assessment 05/14/22: SI joint provocation - thrust and geanslen positive Left SI TTP; pubic symphysis TTP; Left ilium upslip Left adductors 4/5   TODAY'S TREATMENT  Treatment:05/14/22  Manual: Internal STM Patient confirms identification and approves physical therapist to perform internal soft tissue work  Bilateral coccygeus and Rt obturator internus MET to correct left ilium - isometric hip ext with overpressure from PT; 10x add/abd isometrics Rt gluteals and piriformis  Trigger Point Dry-Needling  Treatment instructions: Expect mild to moderate muscle soreness. S/S of pneumothorax if dry needled over a lung field, and to seek immediate medical attention should they occur. Patient verbalized understanding of these instructions and education.  Patient Consent Given: Yes Education handout provided: Previously provided Muscles treated: glutes and  piriformis bil Electrical stimulation performed: No Parameters: N/A Treatment response/outcome: muscle twitch and increased soft tissue length    Treatment:03/24/22 Exercises and Neuro re-ed: Hip flexion on step Clam on Rt side (increased pain and tension) Step down 10x bil Supine on foam roller - alt LE Primal push up Qped - UE alternating raises Pball under forearms in kneeling plank - rolling out and back; side to side - 2x 10   Nuero Re-ed: Education and cues for coordination of breathing and relaxing at appropriate times - pelvic floor isolating exercises  Manual Rt gluteals and lumbar paraspinals Trigger Point Dry-Needling  Treatment instructions: Expect mild to moderate muscle soreness. S/S of pneumothorax if dry needled over a lung field, and to seek immediate medical attention should they occur. Patient verbalized understanding of these instructions and education.  Patient Consent Given: Yes Education handout provided: Yes Muscles treated: lumbar multifidi and gluteals on the right Electrical stimulation performed: No Parameters: N/A Treatment response/outcome: improved soft tissue length   Treatment:03/19/22 Exercises and Neuro re-ed: Hip abduction standing blue band Woodpeckers holding on to the chair Side and fwd lunge with BOSU - needs some UE support Prone kegel on pball and on wedge pillow Pball UE reaches and LE reches 20x Side lying hip abduction 20x Hollow holds - 5 x 20 sec  Nuero Re-ed: Education and cues for coordination of breathing and relaxing at appropriate times - pelvic floor isolating exercises  Treatment:6/9//23 Exercises and Neuro re-ed: Standing staggered and neutral, toes in - kegel all ways Quadruped on mat and on ball - UE reaches; kegel with hip IR Squats with various cues to activate core and pelvic floor more - rotation of feet, lifting arches  Manual: Patient confirms identification and approves physical therapist to perform  internal soft tissue work  STM to pelvic floor - obdurator internus and coccygeus  Nuero Re-ed: Education and cues for coordination of breathing and relaxing at appropriate times - pelvic floor isolating exercises      PATIENT EDUCATION:  Education details: Access Code: [email or web address] Education method: Explanation, Facilities manager, and Handouts Education comprehension: verbalized understanding and returned demonstration     HOME EXERCISE PROGRAM: Access Code: [email or web address] URL: [email or web address] Date: 03/20/2022 Prepared by: Dwana Curd  Exercises - Supine Diaphragmatic Breathing  - 3 x daily - 7 x weekly - 1 sets - 10 reps - Happy Baby with Pelvic Floor Lengthening  - 1 x daily - 7 x weekly - 1 sets - 3 reps - 30 hold - Diaphragmatic Breathing in Child's Pose with Pelvic Floor Relaxation  - 1 x  daily - 7 x weekly - 1 sets - 5 reps - 30 sec hold - Child's Pose with Thread the Needle  - 1 x daily - 7 x weekly - 1 sets - 3 reps - World's Greatest Stretch - Lunge with Ipsilateral Thoracic Rotation  - 1 x daily - 7 x weekly - 1 sets - 3 reps - Pigeon Pose  - 1 x daily - 7 x weekly - 1 sets - 3 reps - Quadruped Alternating Arm Lift  - 1 x daily - 7 x weekly - 2 sets - 10 reps - Isometric Dead Bug  - 1 x daily - 7 x weekly - 3 sets - 10 reps - Sidelying Hip Abduction  - 1 x daily - 7 x weekly - 3 sets - 10 reps ASSESSMENT:   CLINICAL IMPRESSION: Patient had to cancel appointment and then unable to get in due to our clinic being full at the times she needed, so today was a re-eval of status.  Pt had some very tender and active trigger points in obturator and coccygeus as mentioned.  Pt has some hip weakness. She has upward shift of the left ilium which was corrected with MET today.  She has made excellent progress with decreased urgency and leakage.  Pt will benefit from skilled PT to continue in order to address functional strength and low back/hip pain at this time.        OBJECTIVE IMPAIRMENTS decreased coordination, decreased endurance, decreased strength, increased fascial restrictions, increased muscle spasms, impaired flexibility, impaired tone, postural dysfunction, and pain.    ACTIVITY LIMITATIONS community activity and exercise and recreational activities .    PERSONAL FACTORS 1-2 comorbidities: 2 vaginal deliveries  are also affecting patient's functional outcome.      REHAB POTENTIAL: Excellent   CLINICAL DECISION MAKING: Evolving/moderate complexity   EVALUATION COMPLEXITY: Moderate     GOALS: Goals reviewed with patient? Yes   SHORT TERM GOALS: Target date: 03/09/2022  (Remove Blue Hyperlink),  updated 03/06/22    Ind with initial HEP Baseline: Goal status: met   2.  Pt will be able to isolate pelvic floor for kegel to strengthening muscles and coordinate correctly Baseline:  Goal status: Met 03/06/22        LONG TERM GOALS: Target date: 05/04/2022  (Remove Blue Hyperlink)  updated: 03/19/22 Pt will be independent with advanced HEP to maintain improvements made throughout therapy   Baseline:  Goal status: Ongoing   2.  Pt will be able to functional actions such as bending and coughing without leakage   Baseline:  Goal status: Ongoing   3.  Pt will be able to lift at least 10 lb correctly for 10 reps without pain or leakage for functional activities  Baseline:  Goal status: Ongoing   4.  Pt will report 80% reduction of pain due to improvements in posture, strength, and muscle length and be able to participate in yoga or other exercises   Baseline: 50% improved Goal status: Ongoing     PLAN: PT FREQUENCY: 1x/week   PT DURATION: 12 weeks   PLANNED INTERVENTIONS: Therapeutic exercises, Therapeutic activity, Neuromuscular re-education, Balance training, Gait training, Patient/Family education, Joint mobilization, Dry Needling, Electrical stimulation, Cryotherapy, Moist heat, Taping, Biofeedback, Manual therapy, and  Re-evaluation   PLAN FOR NEXT SESSION: f/u on pelvic alignment and hip strengthening; f/u on response to dry needling and internal trigger point release to coccygeus Lt and obturator Rt    Brayton Caves Donley Harland, PT 05/14/2022,  5:00 PM

## 2022-05-14 ENCOUNTER — Ambulatory Visit: Payer: BC Managed Care – PPO | Attending: Family Medicine | Admitting: Physical Therapy

## 2022-05-14 DIAGNOSIS — M6281 Muscle weakness (generalized): Secondary | ICD-10-CM | POA: Diagnosis not present

## 2022-05-14 DIAGNOSIS — R279 Unspecified lack of coordination: Secondary | ICD-10-CM | POA: Insufficient documentation

## 2022-05-26 ENCOUNTER — Other Ambulatory Visit: Payer: Self-pay | Admitting: *Deleted

## 2022-05-26 ENCOUNTER — Encounter: Payer: Self-pay | Admitting: Sports Medicine

## 2022-05-26 ENCOUNTER — Ambulatory Visit: Payer: BC Managed Care – PPO | Admitting: Sports Medicine

## 2022-05-26 VITALS — BP 143/88 | HR 102 | Ht 63.0 in | Wt 167.8 lb

## 2022-05-26 DIAGNOSIS — G5702 Lesion of sciatic nerve, left lower limb: Secondary | ICD-10-CM | POA: Diagnosis not present

## 2022-05-26 DIAGNOSIS — F419 Anxiety disorder, unspecified: Secondary | ICD-10-CM

## 2022-05-26 DIAGNOSIS — R29898 Other symptoms and signs involving the musculoskeletal system: Secondary | ICD-10-CM | POA: Diagnosis not present

## 2022-05-26 DIAGNOSIS — M25552 Pain in left hip: Secondary | ICD-10-CM | POA: Diagnosis not present

## 2022-05-26 MED ORDER — VENLAFAXINE HCL ER 150 MG PO CP24: ORAL_CAPSULE | ORAL | 0 refills | Status: DC

## 2022-05-26 MED ORDER — VENLAFAXINE HCL ER 75 MG PO CP24: 75.0000 mg | ORAL_CAPSULE | Freq: Every day | ORAL | 0 refills | Status: DC

## 2022-05-26 NOTE — Progress Notes (Signed)
Increase in pain down left leg for 2 months.  Numbness and tingling x1 month in left foot. No groin pain No injury. Has tried ibuprofen, PT, Dry needling which helped only for a couple days. Hx of right SI joint injection but no injection on left yet.

## 2022-05-26 NOTE — Patient Instructions (Signed)
Airyanna - It was great to see you today, thank you for letting me participate in your care!  Today, we discussed your piriformis and lateral hip abductors (gluteus medius and minimus) --> ask your PT to add these exercises into your treatment  You will follow-up in 6 weeks if not improving.  Elba Barman, DO Primary Care Sports Medicine Physician  [place]

## 2022-05-26 NOTE — Progress Notes (Signed)
Office Visit Note   Patient: Alison [name]           Date of Birth: [birth year]           MRN: [id number] Visit Date: 05/26/2022              Requested by: No referring provider defined for this encounter. PCP: Wynn Banker, MD (Inactive)  Assessment & Plan: Visit Diagnoses:  1. Piriformis syndrome, left   2. Greater trochanteric pain syndrome of left lower extremity   3. Weakness of left hip    Plan: Discussed with Heiley that her symptoms seem very classic for piriformis syndrome as well as her having a weakness of her lateral hip abductors, which I feel is contributing to her posterior lateral hip pain.  It is reassuring that her MRI from a few years ago did not show any evidence of neural or spinal cord impingement.  She will work with her current physical therapist on modifying some piriformis stretches and exercises, as well as working on hip abduction strength to get both hips firing equivocally.  I also provided home exercises for her for the piriformis syndrome as well as hip abduction and range of motion exercises.  She will perform these once daily.  She can take ibuprofen as needed for breakthrough pain.  She will follow-up in 6 weeks if not improving, although I am hopeful that once we correct the muscle imbalance her pain will improve.  If symptoms are not improving, we may consider MRI of the pelvis or hip.   Follow-Up Instructions: Return for 6 weeks if not improving with Dr. Shon Baton.   Subjective: Chief Complaint  Patient presents with   Left Hip - Pain   Lower Back - Pain   HPI Alison [name] is a pleasant 41 year old female who presents with left posterolateral leg and hip pain.  This has been a chronic issue for her and her pain waxes and wanes.  She was last seen here by Dr. Prince Rome in June 2020.  She reports a history of a cortisone injection, I believe this to be in the greater trochanteric region by Dr. Lajoyce Corners in Oct 2019, which did not provide much relief.  She had an  MRI of the lumbar spine which did not show any signs of nerve impingement.  Her pain is come and go, and she has managed it with occasional ibuprofen which does help with her pain.  Here recently she has been undergoing pelvic floor physical therapy.  Her therapist is also been working on her posterior hip and did do some dry needling which significantly improved her posterior buttock pain for a few days, however it has returned.  She has had an increase in pain down the left leg on the posterior lateral side to the level of the knee for the last 2 months.  Sometimes some radiating and tingling sensation.  Denies any groin pain or injury.  She does have a history of right SI joint issues, however this feels like a different issue.  No specific injury known.  Currently still doing physical therapy through [facility].  Objective: Vital Signs: BP (!) 143/88   Pulse (!) 102   Ht 5\' 3"  (1.6 m)   Wt 167 lb 12.8 oz (76.1 kg)   BMI 29.72 kg/m   Physical Exam Gen: Well-appearing, in no acute distress; non-toxic CV: Regular Rate. Well-perfused. Warm.  Resp: Breathing unlabored on room air; no wheezing. Psych: Fluid speech in conversation; appropriate affect;  normal thought process Neuro: Sensation intact throughout. No gross coordination deficits.   Ortho Exam  - Lumbar spine: No significant scoliosis noted.  No midline spinous process tenderness.  There is some mild TTP over the right SI joint, no pain of the left SI joint.  Full range of motion with lumbar flexion and extension.  Negative straight leg raise bilaterally.  - Left hip: There is rather point specific tenderness within the mid belly of the piriformis muscle with some hypertonicity.  There is also some tenderness to palpation over the left greater trochanteric region.  There is notable weakness with hip abduction on the left compared to the contralateral right leg, otherwise full strength throughout the hip.  Negative passive logroll  internally and externally.  There is also pain with resisted abduction in a pretzel leg position.   Specialty Comments:  No specialty comments available.  Imaging:  EXAM: MRI LUMBAR SPINE WITHOUT CONTRAST   TECHNIQUE: Multiplanar, multisequence MR imaging of the lumbar spine was performed. No intravenous contrast was administered.   COMPARISON:  Prior radiograph from 10/01/2016.   FINDINGS: Segmentation: Standard. Lowest well-formed disc labeled the L5-S1 level.   Alignment: Mild levoscoliosis. Alignment otherwise normal with preservation of the normal lumbar lordosis. No listhesis or subluxation.   Vertebrae: Vertebral body height maintained without evidence for acute or chronic fracture. Bone marrow signal intensity within normal limits. No discrete or worrisome osseous lesions. No abnormal marrow edema.   Conus medullaris and cauda equina: Conus extends to the L1 level. Conus and cauda equina appear normal.   Paraspinal and other soft tissues: Paraspinous soft tissues within normal limits. Visualized visceral structures unremarkable. Retroaortic left renal vein noted.   Disc levels:   No significant disc pathology seen within the lumbar spine. Intervertebral discs are well hydrated with preserved disc height. No disc bulge or of disc protrusion. No significant facet degeneration. No canal or neural foraminal stenosis. No impingement.   IMPRESSION: 1. Mild levoscoliosis. 2. Otherwise unremarkable and normal MRI of the lumbar spine.     Electronically Signed   By: Rise Mu M.D.   On: 03/27/2019 22:02   PMFS History: Patient Active Problem List   Diagnosis Date Noted   Hypertension 12/20/2016   Other fatigue 12/20/2016   Precordial chest pain 12/20/2016   Seasonal allergies 12/28/2014   Anxiety and depression 12/28/2014   Past Medical History:  Diagnosis Date   Anxiety and depression    Benign gestational thrombocytopenia (HCC) 12/03/2017    Cancer (HCC) 03/2014   basal carcinoma   Gestational hypertension 11/2011   Nephrolithiasis    Obstetrical laceration - bilateral perilabial splays 12/03/2017   Seasonal allergies     Family History  Problem Relation Age of Onset   Arthritis Mother    Ankylosing spondylitis Mother    Osteoporosis Mother 51   Arthritis Father    Other Father        car accident   Anxiety disorder Brother    Hypertension Brother    Arthritis Maternal Grandmother        Rheumatoid Arthritis   Breast cancer Maternal Grandmother 60   Healthy Half-Brother    Healthy Half-Sister    Diabetes Other    Hypertension Other        grandparent   Stroke Other        grandparent    Past Surgical History:  Procedure Laterality Date   MOHS SURGERY  03/2014   BCC on nose.   Social History  Occupational History    Comment: prn   Occupation:    Tobacco Use   Smoking status: Never   Smokeless tobacco: Never   Tobacco comments:    Married, lives with spouse. 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

## 2022-05-26 NOTE — Addendum Note (Signed)
Addended by: Agnes Lawrence on: 05/26/2022 10:11 AM   Modules accepted: Orders

## 2022-06-05 ENCOUNTER — Encounter: Payer: Self-pay | Admitting: Family Medicine

## 2022-06-05 ENCOUNTER — Ambulatory Visit: Payer: BC Managed Care – PPO | Admitting: Family Medicine

## 2022-06-05 VITALS — BP 116/60 | HR 95 | Temp 98.2°F | Ht 63.0 in | Wt 168.4 lb

## 2022-06-05 DIAGNOSIS — F32A Depression, unspecified: Secondary | ICD-10-CM | POA: Diagnosis not present

## 2022-06-05 DIAGNOSIS — I10 Essential (primary) hypertension: Secondary | ICD-10-CM | POA: Diagnosis not present

## 2022-06-05 DIAGNOSIS — F419 Anxiety disorder, unspecified: Secondary | ICD-10-CM | POA: Diagnosis not present

## 2022-06-05 LAB — BASIC METABOLIC PANEL
BUN: 17 mg/dL (ref 6–23)
CO2: 30 mEq/L (ref 19–32)
Calcium: 9.3 mg/dL (ref 8.4–10.5)
Chloride: 103 mEq/L (ref 96–112)
Creatinine, Ser: 0.94 mg/dL (ref 0.40–1.20)
GFR: 75.16 mL/min (ref >60.00)
Glucose, Bld: 88 mg/dL (ref 70–99)
Potassium: 3.7 mEq/L (ref 3.5–5.1)
Sodium: 139 mEq/L (ref 135–145)

## 2022-06-05 MED ORDER — VENLAFAXINE HCL ER 75 MG PO CP24: 75.0000 mg | ORAL_CAPSULE | Freq: Every day | ORAL | 1 refills | Status: DC

## 2022-06-05 MED ORDER — BUPROPION HCL ER (XL) 150 MG PO TB24: 150.0000 mg | ORAL_TABLET | Freq: Every day | ORAL | 1 refills | Status: DC

## 2022-06-05 MED ORDER — VENLAFAXINE HCL ER 150 MG PO CP24: ORAL_CAPSULE | ORAL | 1 refills | Status: DC

## 2022-06-05 NOTE — Progress Notes (Unsigned)
Established Patient Office Visit  Subjective   Patient ID: Alison [name], female    DOB: [birth year]  Age: 41 y.o. MRN: [id number]  Chief Complaint  Patient presents with  . Establish Care    Patient is here for transition of care visit. Patient is reporting continued fatigue and weight gain. States that she is not sure if   Current Outpatient Medications  Medication Instructions  . buPROPion (WELLBUTRIN XL) 150 mg, Oral, Daily  . Cholecalciferol (VITAMIN D PO) Oral  . losartan (COZAAR) 50 MG tablet TAKE 1 TABLET(50 MG) BY MOUTH DAILY  . Multiple Vitamin (MULTIVITAMIN) capsule 1 capsule, Oral, Daily  . Omega-3 Fatty Acids (FISH OIL PO) Oral  . venlafaxine XR (EFFEXOR XR) 75 mg, Oral, Daily with breakfast, Take with the 150mg  effexor XR  . venlafaxine XR (EFFEXOR-XR) 150 MG 24 hr capsule TAKE 1 CAPSULE BY MOUTH EVERY DAY WITH 75mg  to total 225mg  daily dose    Patient Active Problem List   Diagnosis Date Noted  . Hypertension 12/20/2016  . Other fatigue 12/20/2016  . Precordial chest pain 12/20/2016  . Seasonal allergies 12/28/2014  . Anxiety and depression 12/28/2014      Review of Systems  All other systems reviewed and are negative.     Objective:     BP 116/60 (BP Location: Left Arm, Patient Position: Sitting, Cuff Size: Normal)   Pulse 95   Temp 98.2 F (36.8 C) (Oral)   Ht 5\' 3"  (1.6 m)   Wt 168 lb 6.4 oz (76.4 kg)   LMP 05/22/2022 (Approximate)   SpO2 100%   BMI 29.83 kg/m  BP Readings from Last 3 Encounters:  06/05/22 116/60  05/26/22 (!) 143/88  12/05/21 112/78   Wt Readings from Last 3 Encounters:  06/05/22 168 lb 6.4 oz (76.4 kg)  05/26/22 167 lb 12.8 oz (76.1 kg)  12/05/21 168 lb 4.8 oz (76.3 kg)      Physical Exam Vitals reviewed.  Constitutional:      Appearance: Normal appearance. She is well-groomed and normal weight.  HENT:     Head: Normocephalic and atraumatic.     Mouth/Throat:     Mouth: Mucous membranes are moist.     Pharynx:  Oropharynx is clear.  Eyes:     Extraocular Movements: Extraocular movements intact.     Conjunctiva/sclera: Conjunctivae normal.     Pupils: Pupils are equal, round, and reactive to light.  Cardiovascular:     Rate and Rhythm: Normal rate and regular rhythm.     Pulses: Normal pulses.     Heart sounds: S1 normal and S2 normal.  Pulmonary:     Effort: Pulmonary effort is normal.     Breath sounds: Normal breath sounds and air entry.  Abdominal:     General: Abdomen is flat. Bowel sounds are normal.     Palpations: Abdomen is soft.  Musculoskeletal:        General: Normal range of motion.     Cervical back: Normal range of motion and neck supple.     Right lower leg: No edema.     Left lower leg: No edema.  Skin:    General: Skin is warm and dry.  Neurological:     Mental Status: She is alert and oriented to person, place, and time. Mental status is at baseline.     Gait: Gait is intact.  Psychiatric:        Mood and Affect: Mood and affect normal.  Speech: Speech normal.        Behavior: Behavior normal.        Judgment: Judgment normal.     No results found for any visits on 06/05/22.  Last metabolic panel Lab Results  Component Value Date   GLUCOSE 102 (H) 12/18/2021   NA 136 12/18/2021   K 4.1 12/18/2021   CL 105 12/18/2021   CO2 24 12/18/2021   BUN 19 12/18/2021   CREATININE 0.89 12/18/2021   GFRNONAA >60 12/03/2017   CALCIUM 8.4 12/18/2021   CALCIUM 8.2 (L) 12/18/2021   PROT 7.0 12/05/2021   ALBUMIN 3.1 (L) 12/03/2017   BILITOT 0.4 12/05/2021   ALKPHOS 119 12/03/2017   AST 20 12/05/2021   ALT 19 12/05/2021   ANIONGAP 9 12/03/2017   Last lipids Lab Results  Component Value Date   CHOL 202 (H) 12/05/2021   HDL 52 12/05/2021   LDLCALC 116 (H) 12/05/2021   LDLDIRECT 129.2 08/23/2013   TRIG 227 (H) 12/05/2021   CHOLHDL 3.9 12/05/2021      The 10-year ASCVD risk score (Arnett DK, et al., 2019) is: 0.8%    Assessment & Plan:   Problem List  Items Addressed This Visit       Cardiovascular and Mediastinum   Hypertension - Primary   Relevant Orders   Basic Metabolic Panel     Other   Anxiety and depression   Relevant Medications   buPROPion (WELLBUTRIN XL) 150 MG 24 hr tablet   venlafaxine XR (EFFEXOR-XR) 150 MG 24 hr capsule   venlafaxine XR (EFFEXOR XR) 75 MG 24 hr capsule    No follow-ups on file.    Karie Georges, MD

## 2022-06-08 NOTE — Progress Notes (Signed)
Normal labs.

## 2022-06-08 NOTE — Assessment & Plan Note (Signed)
BP is very well controlled today on 50 mg losartan daily, will continue this medication

## 2022-06-08 NOTE — Assessment & Plan Note (Signed)
    06/05/2022    1:03 PM 12/05/2021    3:54 PM 12/05/2021    3:30 PM  PHQ9 SCORE ONLY  PHQ-9 Total Score '4 4 4     '$ PHQ shows good control of her mood symptoms. She does report chronic fatigue, her weight is not much changed since last visit, reassured patient. Will continue effexor 225 mg daily and we discussed adding wellbutrin 150 mg daily to help with the fatigue and weight gain, we discussed risks and benefits of the medication and she will let me know how it works for her.

## 2022-06-25 ENCOUNTER — Ambulatory Visit: Payer: BC Managed Care – PPO | Attending: Pediatric Radiology | Admitting: Physical Therapy

## 2022-06-25 ENCOUNTER — Encounter: Payer: Self-pay | Admitting: Physical Therapy

## 2022-06-25 DIAGNOSIS — R279 Unspecified lack of coordination: Secondary | ICD-10-CM

## 2022-06-25 DIAGNOSIS — M6281 Muscle weakness (generalized): Secondary | ICD-10-CM | POA: Diagnosis not present

## 2022-06-25 NOTE — Therapy (Signed)
OUTPATIENT PHYSICAL THERAPY TREATMENT NOTE   Patient Name: Alison [name] MRN: [id number] DOB:July 14, 1981, 41 y.o., female Today's Date: 06/25/2022  PCP: PCP: Wynn Banker, MD REFERRING PROVIDER:  Wynn Banker, MD  END OF SESSION:   PT End of Session - 06/25/22 1459     Visit Number 7    Date for PT Re-Evaluation 08/06/22    Authorization Type [employer]    PT Start Time 1400    PT Stop Time 1442    PT Time Calculation (min) 42 min    Activity Tolerance Patient tolerated treatment well    Behavior During Therapy [facility] for tasks assessed/performed                  Past Medical History:  Diagnosis Date   Anxiety and depression    Benign gestational thrombocytopenia (HCC) 12/03/2017   Cancer (HCC) 03/2014   basal carcinoma   Gestational hypertension 11/2011   Nephrolithiasis    Obstetrical laceration - bilateral perilabial splays 12/03/2017   Seasonal allergies    Past Surgical History:  Procedure Laterality Date   MOHS SURGERY  03/2014   BCC on nose.   Patient Active Problem List   Diagnosis Date Noted   Hypertension 12/20/2016   Other fatigue 12/20/2016   Precordial chest pain 12/20/2016   Seasonal allergies 12/28/2014   Anxiety and depression 12/28/2014    REFERRING DIAG: R32 (ICD-10-CM) - Incontinence in female  THERAPY DIAG:  Unspecified lack of coordination  Muscle weakness (generalized)  Rationale for Evaluation and Treatment Rehabilitation  PERTINENT HISTORY: 2 vaginal deliveries  PRECAUTIONS: None  SUBJECTIVE: I am feeling better andno pain today.  After the dry needling I felt really good for a while but   PAIN:  Are you having pain? No   OBJECTIVE: (objective measures completed at initial evaluation unless otherwise dated)  MUSCLE LENGTH: Hamstrings: Right WFL deg; Left WFL  deg Maisie Fus test: Right WFL  deg; Left WFL  deg   LUMBAR SPECIAL TESTS:  ASLR test - negative       GAIT: Comments: WFL   POSTURE:  Increased  anterior tilt and lumbar lordosis, fwd flexion Rt side less movement   LUMBARAROM/PROM   A/PROM A/PROM  02/09/2022  Flexion normal  Extension    Right lateral flexion    Left lateral flexion    Right rotation    Left rotation     (Blank rows = not tested)   LE MMT:   MMT Right 02/09/2022 Left 02/09/2022  Hip flexion      Hip extension      Hip abduction 4+/5 4/5  Hip adduction 5/5 5/5  Hip internal rotation      Hip external rotation      Knee flexion      Knee extension      Ankle dorsiflexion      Ankle plantarflexion      Ankle inversion      Ankle eversion        PELVIC MMT:   MMT   02/09/2022  Vaginal 2/5 weak squeeze x 5 sec  Internal Anal Sphincter    External Anal Sphincter    Puborectalis    Diastasis Recti    (Blank rows = not tested)         PALPATION:   General  DRA above umbilicus one finger width and 1 cm depth                 External Perineal  Exam normal                             Internal Pelvic Floor Rt levators TTP and tight, gluteal compensation and holding breath   TONE: High on Rt side   PROLAPSE: Posterior and anterior to hymen with valsalva  Assessment 05/14/22: SI joint provocation - thrust and geanslen positive Left SI TTP; pubic symphysis TTP; Left ilium upslip Left adductors 4/5   TODAY'S TREATMENT  Treatment:05/25/22  Manual: STM Lt gluteals and piriformis  Trigger Point Dry-Needling  Treatment instructions: Expect mild to moderate muscle soreness. S/S of pneumothorax if dry needled over a lung field, and to seek immediate medical attention should they occur. Patient verbalized understanding of these instructions and education.  Patient Consent Given: Yes Education handout provided: Previously provided Muscles treated: glutes and piriformis Left side Electrical stimulation performed: No Parameters: N/A Treatment response/outcome: muscle twitch and increased soft tissue length  Exercise: Standing:  Pallof press  green band - side step; presses; squats - 20x each Half kneel rotation - green band - 20 x Single leg - rotation with 10lb - 2x5 bil Single leg 10lb overhead - marching 10x Quadruped and supine Primal press ups - 3 x 5 LE 90-90 - alt LE tapping   Treatment:05/14/22  Manual: Internal STM Patient confirms identification and approves physical therapist to perform internal soft tissue work  Bilateral coccygeus and Rt obturator internus MET to correct left ilium - isometric hip ext with overpressure from PT; 10x add/abd isometrics Rt gluteals and piriformis  Trigger Point Dry-Needling  Treatment instructions: Expect mild to moderate muscle soreness. S/S of pneumothorax if dry needled over a lung field, and to seek immediate medical attention should they occur. Patient verbalized understanding of these instructions and education.  Patient Consent Given: Yes Education handout provided: Previously provided Muscles treated: glutes and piriformis bil Electrical stimulation performed: No Parameters: N/A Treatment response/outcome: muscle twitch and increased soft tissue length    Treatment:03/24/22 Exercises and Neuro re-ed: Hip flexion on step Clam on Rt side (increased pain and tension) Step down 10x bil Supine on foam roller - alt LE Primal push up Qped - UE alternating raises Pball under forearms in kneeling plank - rolling out and back; side to side - 2x 10   Nuero Re-ed: Education and cues for coordination of breathing and relaxing at appropriate times - pelvic floor isolating exercises  Manual Rt gluteals and lumbar paraspinals Trigger Point Dry-Needling  Treatment instructions: Expect mild to moderate muscle soreness. S/S of pneumothorax if dry needled over a lung field, and to seek immediate medical attention should they occur. Patient verbalized understanding of these instructions and education.  Patient Consent Given: Yes Education handout provided: Yes Muscles  treated: lumbar multifidi and gluteals on the right Electrical stimulation performed: No Parameters: N/A Treatment response/outcome: improved soft tissue length   Treatment:03/19/22 Exercises and Neuro re-ed: Hip abduction standing blue band Woodpeckers holding on to the chair Side and fwd lunge with BOSU - needs some UE support Prone kegel on pball and on wedge pillow Pball UE reaches and LE reches 20x Side lying hip abduction 20x Hollow holds - 5 x 20 sec  Nuero Re-ed: Education and cues for coordination of breathing and relaxing at appropriate times - pelvic floor isolating exercises      PATIENT EDUCATION:  Education details: Access Code: [email or web address] Education method: Explanation, Demonstration, and Handouts Education comprehension: verbalized understanding and returned demonstration  HOME EXERCISE PROGRAM: Access Code: [email or web address] URL: [email or web address] Date: 03/20/2022 Prepared by: Dwana Curd  Exercises - Supine Diaphragmatic Breathing  - 3 x daily - 7 x weekly - 1 sets - 10 reps - Happy Baby with Pelvic Floor Lengthening  - 1 x daily - 7 x weekly - 1 sets - 3 reps - 30 hold - Diaphragmatic Breathing in Child's Pose with Pelvic Floor Relaxation  - 1 x daily - 7 x weekly - 1 sets - 5 reps - 30 sec hold - Child's Pose with Thread the Needle  - 1 x daily - 7 x weekly - 1 sets - 3 reps - World's Greatest Stretch - Lunge with Ipsilateral Thoracic Rotation  - 1 x daily - 7 x weekly - 1 sets - 3 reps - Pigeon Pose  - 1 x daily - 7 x weekly - 1 sets - 3 reps - Quadruped Alternating Arm Lift  - 1 x daily - 7 x weekly - 2 sets - 10 reps - Isometric Dead Bug  - 1 x daily - 7 x weekly - 3 sets - 10 reps - Sidelying Hip Abduction  - 1 x daily - 7 x weekly - 3 sets - 10 reps ASSESSMENT:   CLINICAL IMPRESSION: Patient has been doing a little better but still inflamed around the left SI joint.  Still more weakness around the Rt glutes.  Pt responded well  to exercises and were challenging but able to maintain good techcnique and form.  Pt did well with cues to lengthen low back and keep ribcage pressed into the mat.  No DRA present with correctly activating the transversus abdominus and obliques  Pt will benefit from skilled PT to continue in order to address functional strength and low back/hip pain at this time.       OBJECTIVE IMPAIRMENTS decreased coordination, decreased endurance, decreased strength, increased fascial restrictions, increased muscle spasms, impaired flexibility, impaired tone, postural dysfunction, and pain.    ACTIVITY LIMITATIONS community activity and exercise and recreational activities .    PERSONAL FACTORS 1-2 comorbidities: 2 vaginal deliveries  are also affecting patient's functional outcome.      REHAB POTENTIAL: Excellent   CLINICAL DECISION MAKING: Evolving/moderate complexity   EVALUATION COMPLEXITY: Moderate     GOALS: Goals reviewed with patient? Yes   SHORT TERM GOALS: Target date: 03/09/2022  (Remove Blue Hyperlink),  updated 03/06/22    Ind with initial HEP Baseline: Goal status: met   2.  Pt will be able to isolate pelvic floor for kegel to strengthening muscles and coordinate correctly Baseline:  Goal status: Met 03/06/22        LONG TERM GOALS: Target date: 05/04/2022  (Remove Blue Hyperlink)  updated: 03/19/22 Pt will be independent with advanced HEP to maintain improvements made throughout therapy   Baseline:  Goal status: Ongoing   2.  Pt will be able to functional actions such as bending and coughing without leakage   Baseline:  Goal status: Ongoing   3.  Pt will be able to lift at least 10 lb correctly for 10 reps without pain or leakage for functional activities  Baseline:  Goal status: Ongoing   4.  Pt will report 80% reduction of pain due to improvements in posture, strength, and muscle length and be able to participate in yoga or other exercises   Baseline: 50%  improved Goal status: Ongoing     PLAN: PT FREQUENCY: 1x/week   PT DURATION: 12 weeks  PLANNED INTERVENTIONS: Therapeutic exercises, Therapeutic activity, Neuromuscular re-education, Balance training, Gait training, Patient/Family education, Joint mobilization, Dry Needling, Electrical stimulation, Cryotherapy, Moist heat, Taping, Biofeedback, Manual therapy, and Re-evaluation   PLAN FOR NEXT SESSION: cont dry needling glutes as needed and progress core strength    Brayton Caves Hedi Barkan, PT 06/25/2022, 3:48 PM

## 2022-07-02 ENCOUNTER — Ambulatory Visit: Payer: BC Managed Care – PPO | Admitting: Physical Therapy

## 2022-07-09 ENCOUNTER — Ambulatory Visit: Payer: BC Managed Care – PPO | Attending: Pediatric Radiology | Admitting: Physical Therapy

## 2022-07-09 ENCOUNTER — Encounter: Payer: Self-pay | Admitting: Physical Therapy

## 2022-07-09 DIAGNOSIS — R279 Unspecified lack of coordination: Secondary | ICD-10-CM | POA: Insufficient documentation

## 2022-07-09 DIAGNOSIS — M6281 Muscle weakness (generalized): Secondary | ICD-10-CM | POA: Diagnosis not present

## 2022-07-09 NOTE — Therapy (Signed)
OUTPATIENT PHYSICAL THERAPY TREATMENT NOTE   Patient Name: Alison [name] MRN: [id number] DOB:[birth year], 41 y.o., female Today's Date: 07/09/2022  PCP: PCP: Wynn Banker, MD REFERRING PROVIDER:  Wynn Banker, MD  END OF SESSION:          Past Medical History:  Diagnosis Date   Anxiety and depression    Benign gestational thrombocytopenia (HCC) 12/03/2017   Cancer (HCC) 03/2014   basal carcinoma   Gestational hypertension 11/2011   Nephrolithiasis    Obstetrical laceration - bilateral perilabial splays 12/03/2017   Seasonal allergies    Past Surgical History:  Procedure Laterality Date   MOHS SURGERY  03/2014   BCC on nose.   Patient Active Problem List   Diagnosis Date Noted   Hypertension 12/20/2016   Other fatigue 12/20/2016   Precordial chest pain 12/20/2016   Seasonal allergies 12/28/2014   Anxiety and depression 12/28/2014    REFERRING DIAG: R32 (ICD-10-CM) - Incontinence in female  THERAPY DIAG:  No diagnosis found.  Rationale for Evaluation and Treatment Rehabilitation  PERTINENT HISTORY: 2 vaginal deliveries  PRECAUTIONS: None  SUBJECTIVE: I am feeling better and have not had much pain.  It is back a small amount. PAIN:  Are you having pain? Yes NPRS scale: 3/10 Pain location: piriformis Pain orientation: Lateral  PAIN TYPE: dull Pain description: intermittent  Aggravating factors: comes and goes Relieving factors: comes and goes    OBJECTIVE: (objective measures completed at initial evaluation unless otherwise dated)  MUSCLE LENGTH: Hamstrings: Right WFL deg; Left WFL  deg Maisie Fus test: Right WFL  deg; Left WFL  deg   LUMBAR SPECIAL TESTS:  ASLR test - negative       GAIT: Comments: WFL   POSTURE:  Increased anterior tilt and lumbar lordosis, fwd flexion Rt side less movement   LUMBARAROM/PROM   A/PROM A/PROM  02/09/2022  Flexion normal  Extension    Right lateral flexion    Left lateral flexion    Right  rotation    Left rotation     (Blank rows = not tested)   LE MMT:   MMT Right 02/09/2022 Left 02/09/2022  Hip flexion      Hip extension      Hip abduction 4+/5 4/5  Hip adduction 5/5 5/5  Hip internal rotation      Hip external rotation      Knee flexion      Knee extension      Ankle dorsiflexion      Ankle plantarflexion      Ankle inversion      Ankle eversion        PELVIC MMT:   MMT   02/09/2022  Vaginal 2/5 weak squeeze x 5 sec  Internal Anal Sphincter    External Anal Sphincter    Puborectalis    Diastasis Recti    (Blank rows = not tested)         PALPATION:   General  DRA above umbilicus one finger width and 1 cm depth                 External Perineal Exam normal                             Internal Pelvic Floor Rt levators TTP and tight, gluteal compensation and holding breath   TONE: High on Rt side   PROLAPSE: Posterior and anterior to hymen with valsalva  Assessment  05/14/22: SI joint provocation - thrust and geanslen positive Left SI TTP; pubic symphysis TTP; Left ilium upslip Left adductors 4/5   TODAY'S TREATMENT  Treatment:07/09/22  Manual: STM Lt gluteals and piriformis  Trigger Point Dry-Needling  Treatment instructions: Expect mild to moderate muscle soreness. S/S of pneumothorax if dry needled over a lung field, and to seek immediate medical attention should they occur. Patient verbalized understanding of these instructions and education.  Patient Consent Given: Yes Education handout provided: Previously provided Muscles treated: glutes and piriformis Left side Electrical stimulation performed: No Parameters: N/A Treatment response/outcome: muscle twitch and increased soft tissue length  Exercise: Blue band with torque around shoulder - step outs backward - 2 x 10  Press out to the side blue band in half kneel - 10x each Tall kneel anti rotation blue band - 10x each side Quadruped LE and UE/LE - blue resistance - 10x  each Single leg dead lift 5lb - opposing leg support - 2 x 10  Treatment:05/25/22  Manual: STM Lt gluteals and piriformis  Trigger Point Dry-Needling  Treatment instructions: Expect mild to moderate muscle soreness. S/S of pneumothorax if dry needled over a lung field, and to seek immediate medical attention should they occur. Patient verbalized understanding of these instructions and education.  Patient Consent Given: Yes Education handout provided: Previously provided Muscles treated: glutes and piriformis Left side Electrical stimulation performed: No Parameters: N/A Treatment response/outcome: muscle twitch and increased soft tissue length  Exercise: Standing:  Pallof press green band - side step; presses; squats - 20x each Half kneel rotation - green band - 20 x Single leg - rotation with 10lb - 2x5 bil Single leg 10lb overhead - marching 10x Quadruped and supine Primal press ups - 3 x 5 LE 90-90 - alt LE tapping   Treatment:05/14/22  Manual: Internal STM Patient confirms identification and approves physical therapist to perform internal soft tissue work  Bilateral coccygeus and Rt obturator internus MET to correct left ilium - isometric hip ext with overpressure from PT; 10x add/abd isometrics Rt gluteals and piriformis  Trigger Point Dry-Needling  Treatment instructions: Expect mild to moderate muscle soreness. S/S of pneumothorax if dry needled over a lung field, and to seek immediate medical attention should they occur. Patient verbalized understanding of these instructions and education.  Patient Consent Given: Yes Education handout provided: Previously provided Muscles treated: glutes and piriformis bil Electrical stimulation performed: No Parameters: N/A Treatment response/outcome: muscle twitch and increased soft tissue length     PATIENT EDUCATION:  Education details: Access Code: [email or web address] Education method: Explanation, Demonstration, and  Handouts Education comprehension: verbalized understanding and returned demonstration     HOME EXERCISE PROGRAM: Access Code: [email or web address] URL: [email or web address] Date: 03/20/2022 Prepared by: Dwana Curd  Exercises - Supine Diaphragmatic Breathing  - 3 x daily - 7 x weekly - 1 sets - 10 reps - Happy Baby with Pelvic Floor Lengthening  - 1 x daily - 7 x weekly - 1 sets - 3 reps - 30 hold - Diaphragmatic Breathing in Child's Pose with Pelvic Floor Relaxation  - 1 x daily - 7 x weekly - 1 sets - 5 reps - 30 sec hold - Child's Pose with Thread the Needle  - 1 x daily - 7 x weekly - 1 sets - 3 reps - World's Greatest Stretch - Lunge with Ipsilateral Thoracic Rotation  - 1 x daily - 7 x weekly - 1 sets - 3 reps - Pigeon Pose  - 1 x daily -  7 x weekly - 1 sets - 3 reps - Quadruped Alternating Arm Lift  - 1 x daily - 7 x weekly - 2 sets - 10 reps - Isometric Dead Bug  - 1 x daily - 7 x weekly - 3 sets - 10 reps - Sidelying Hip Abduction  - 1 x daily - 7 x weekly - 3 sets - 10 reps ASSESSMENT:   CLINICAL IMPRESSION: Patient has been doing much better and only having a little pain.  Still having leakage but that is also better.  Today's session focused on continue to improve soft tissue length and exercises progressions to work on pelvic and hip stability.  Pt will benefit from skilled PT to continue in order to address functional strength and low back/hip pain at this time.       OBJECTIVE IMPAIRMENTS decreased coordination, decreased endurance, decreased strength, increased fascial restrictions, increased muscle spasms, impaired flexibility, impaired tone, postural dysfunction, and pain.    ACTIVITY LIMITATIONS community activity and exercise and recreational activities .    PERSONAL FACTORS 1-2 comorbidities: 2 vaginal deliveries  are also affecting patient's functional outcome.      REHAB POTENTIAL: Excellent   CLINICAL DECISION MAKING: Evolving/moderate  complexity   EVALUATION COMPLEXITY: Moderate     GOALS: Goals reviewed with patient? Yes   SHORT TERM GOALS: Target date: 03/09/2022  (Remove Blue Hyperlink),  updated 03/06/22    Ind with initial HEP Baseline: Goal status: met   2.  Pt will be able to isolate pelvic floor for kegel to strengthening muscles and coordinate correctly Baseline:  Goal status: Met 03/06/22        LONG TERM GOALS: Target date: 05/04/2022  (Remove Blue Hyperlink)  updated: 07/09/22  Pt will be independent with advanced HEP to maintain improvements made throughout therapy   Baseline:  Goal status: Ongoing   2.  Pt will be able to functional actions such as bending and coughing without leakage   Baseline: reports some leakage with bend and cough but I think it has improved Goal status: Ongoing   3.  Pt will be able to lift at least 10 lb correctly for 10 reps without pain or leakage for functional activities  Baseline: 5lb done correctly for 2 sets of 10 Goal status: Ongoing   4.  Pt will report 80% reduction of pain due to improvements in posture, strength, and muscle length and be able to participate in yoga or other exercises   Baseline: 50% improved Goal status: Ongoing     PLAN: PT FREQUENCY: 1x/week   PT DURATION: 12 weeks   PLANNED INTERVENTIONS: Therapeutic exercises, Therapeutic activity, Neuromuscular re-education, Balance training, Gait training, Patient/Family education, Joint mobilization, Dry Needling, Electrical stimulation, Cryotherapy, Moist heat, Taping, Biofeedback, Manual therapy, and Re-evaluation   PLAN FOR NEXT SESSION: cont dry needling glutes as needed and progress core strength    Brayton Caves Telesha Deguzman, PT 07/09/2022, 1:03 PM

## 2022-07-16 ENCOUNTER — Ambulatory Visit: Payer: BC Managed Care – PPO | Admitting: Physical Therapy

## 2022-07-16 ENCOUNTER — Encounter: Payer: Self-pay | Admitting: Physical Therapy

## 2022-07-16 DIAGNOSIS — R279 Unspecified lack of coordination: Secondary | ICD-10-CM

## 2022-07-16 DIAGNOSIS — M6281 Muscle weakness (generalized): Secondary | ICD-10-CM

## 2022-07-16 NOTE — Therapy (Addendum)
OUTPATIENT PHYSICAL THERAPY TREATMENT NOTE   Patient Name: Alison [name] MRN: [id number] DOB:07-26-1981, 41 y.o., female Today's Date: 07/16/2022  PCP: PCP: Wynn Banker, MD REFERRING PROVIDER:  Wynn Banker, MD  END OF SESSION:   PT End of Session - 07/16/22 1400     Visit Number 9    Date for PT Re-Evaluation 08/06/22    Authorization Type [employer]    PT Start Time 1400    PT Stop Time 1439    PT Time Calculation (min) 39 min    Activity Tolerance Patient tolerated treatment well    Behavior During Therapy WFL for tasks assessed/performed                   Past Medical History:  Diagnosis Date   Anxiety and depression    Benign gestational thrombocytopenia (HCC) 12/03/2017   Cancer (HCC) 03/2014   basal carcinoma   Gestational hypertension 11/2011   Nephrolithiasis    Obstetrical laceration - bilateral perilabial splays 12/03/2017   Seasonal allergies    Past Surgical History:  Procedure Laterality Date   MOHS SURGERY  03/2014   BCC on nose.   Patient Active Problem List   Diagnosis Date Noted   Hypertension 12/20/2016   Other fatigue 12/20/2016   Precordial chest pain 12/20/2016   Seasonal allergies 12/28/2014   Anxiety and depression 12/28/2014    REFERRING DIAG: R32 (ICD-10-CM) - Incontinence in female  THERAPY DIAG:  Unspecified lack of coordination  Muscle weakness (generalized)  Rationale for Evaluation and Treatment Rehabilitation  PERTINENT HISTORY: 2 vaginal deliveries  PRECAUTIONS: None  SUBJECTIVE: I am more flared up today and this week was worse. I cannot sleep on that side.   PAIN:  Are you having pain? Yes NPRS scale: 4/10 Pain location: left buttock down the leg Pain orientation: Lateral  PAIN TYPE: dull Pain description: intermittent  Aggravating factors: comes and goes Relieving factors: comes and goes    OBJECTIVE: (objective measures completed at initial evaluation unless otherwise dated)  MUSCLE  LENGTH: Hamstrings: Right WFL deg; Left WFL  deg Maisie Fus test: Right WFL  deg; Left WFL  deg   LUMBAR SPECIAL TESTS:  ASLR test - negative       GAIT: Comments: WFL   POSTURE:  Increased anterior tilt and lumbar lordosis, fwd flexion Rt side less movement   LUMBARAROM/PROM   A/PROM A/PROM  02/09/2022  Flexion normal  Extension    Right lateral flexion    Left lateral flexion    Right rotation    Left rotation     (Blank rows = not tested)   LE MMT:   MMT Right 02/09/2022 Left 02/09/2022  Hip flexion      Hip extension      Hip abduction 4+/5 4/5  Hip adduction 5/5 5/5  Hip internal rotation      Hip external rotation      Knee flexion      Knee extension      Ankle dorsiflexion      Ankle plantarflexion      Ankle inversion      Ankle eversion        PELVIC MMT:   MMT   02/09/2022  Vaginal 2/5 weak squeeze x 5 sec  Internal Anal Sphincter    External Anal Sphincter    Puborectalis    Diastasis Recti    (Blank rows = not tested)         PALPATION:  General  DRA above umbilicus one finger width and 1 cm depth                 External Perineal Exam normal                             Internal Pelvic Floor Rt levators TTP and tight, gluteal compensation and holding breath   TONE: High on Rt side   PROLAPSE: Posterior and anterior to hymen with valsalva  Assessment 05/14/22: SI joint provocation - thrust and geanslen positive Left SI TTP; pubic symphysis TTP; Left ilium upslip Left adductors 4/5   TODAY'S TREATMENT  Treatment:07/16/22  Manual: STM Lt gluteals and piriformis and hip flexor  Trigger Point Dry-Needling  Treatment instructions: Expect mild to moderate muscle soreness. S/S of pneumothorax if dry needled over a lung field, and to seek immediate medical attention should they occur. Patient verbalized understanding of these instructions and education.  Patient Consent Given: Yes Education handout provided: Previously provided Muscles  treated: glutes and piriformis Left side Electrical stimulation performed: No Parameters: N/A Treatment response/outcome: muscle twitch and increased soft tissue length  Exercise: Lateral step out with band presses Hip flexor stretch with quad in half kneel MET demonstration and educated Reviewed HEP for d/c  Treatment:07/09/22  Manual: STM Lt gluteals and piriformis  Trigger Point Dry-Needling  Treatment instructions: Expect mild to moderate muscle soreness. S/S of pneumothorax if dry needled over a lung field, and to seek immediate medical attention should they occur. Patient verbalized understanding of these instructions and education.  Patient Consent Given: Yes Education handout provided: Previously provided Muscles treated: glutes and piriformis Left side Electrical stimulation performed: No Parameters: N/A Treatment response/outcome: muscle twitch and increased soft tissue length  Exercise: Blue band with torque around shoulder - step outs backward - 2 x 10  Press out to the side blue band in half kneel - 10x each Tall kneel anti rotation blue band - 10x each side Quadruped LE and UE/LE - blue resistance - 10x each Single leg dead lift 5lb - opposing leg support - 2 x 10  Treatment:05/25/22  Manual: STM Lt gluteals and piriformis  Trigger Point Dry-Needling  Treatment instructions: Expect mild to moderate muscle soreness. S/S of pneumothorax if dry needled over a lung field, and to seek immediate medical attention should they occur. Patient verbalized understanding of these instructions and education.  Patient Consent Given: Yes Education handout provided: Previously provided Muscles treated: glutes and piriformis Left side Electrical stimulation performed: No Parameters: N/A Treatment response/outcome: muscle twitch and increased soft tissue length  Exercise: Standing:  Pallof press green band - side step; presses; squats - 20x each Half kneel rotation -  green band - 20 x Single leg - rotation with 10lb - 2x5 bil Single leg 10lb overhead - marching 10x Quadruped and supine Primal press ups - 3 x 5 LE 90-90 - alt LE tapping   Treatment:05/14/22  Manual: Internal STM Patient confirms identification and approves physical therapist to perform internal soft tissue work  Bilateral coccygeus and Rt obturator internus MET to correct left ilium - isometric hip ext with overpressure from PT; 10x add/abd isometrics Rt gluteals and piriformis  Trigger Point Dry-Needling  Treatment instructions: Expect mild to moderate muscle soreness. S/S of pneumothorax if dry needled over a lung field, and to seek immediate medical attention should they occur. Patient verbalized understanding of these instructions and education.  Patient Consent Given:  Yes Education handout provided: Previously provided Muscles treated: glutes and piriformis bil Electrical stimulation performed: No Parameters: N/A Treatment response/outcome: muscle twitch and increased soft tissue length     PATIENT EDUCATION:  Education details: Access Code: [email or web address] Education method: Explanation, Demonstration, and Handouts Education comprehension: verbalized understanding and returned demonstration     HOME EXERCISE PROGRAM: Access Code: [email or web address] URL: [email or web address] Date: 03/20/2022 Prepared by: Dwana Curd  Exercises - Supine Diaphragmatic Breathing  - 3 x daily - 7 x weekly - 1 sets - 10 reps - Happy Baby with Pelvic Floor Lengthening  - 1 x daily - 7 x weekly - 1 sets - 3 reps - 30 hold - Diaphragmatic Breathing in Child's Pose with Pelvic Floor Relaxation  - 1 x daily - 7 x weekly - 1 sets - 5 reps - 30 sec hold - Child's Pose with Thread the Needle  - 1 x daily - 7 x weekly - 1 sets - 3 reps - World's Greatest Stretch - Lunge with Ipsilateral Thoracic Rotation  - 1 x daily - 7 x weekly - 1 sets - 3 reps - Pigeon Pose  - 1 x daily - 7 x weekly -  1 sets - 3 reps - Quadruped Alternating Arm Lift  - 1 x daily - 7 x weekly - 2 sets - 10 reps - Isometric Dead Bug  - 1 x daily - 7 x weekly - 3 sets - 10 reps - Sidelying Hip Abduction  - 1 x daily - 7 x weekly - 3 sets - 10 reps ASSESSMENT:   CLINICAL IMPRESSION: At this time, pt is ind with exercise program with core and hip strength for improved pelvic stability.  Pt was educated on some things to do when flare ups occur which is still happening on occasion.  Dry needling piriformis and gluteals seems to consistently resolve the issue. There is a suspicion of some underlying inflammatory process that is causing the flare ups to occur.  At this time, she will return to her MD for re-assessing further testing or imaging regarding the current level of pain.       OBJECTIVE IMPAIRMENTS decreased coordination, decreased endurance, decreased strength, increased fascial restrictions, increased muscle spasms, impaired flexibility, impaired tone, postural dysfunction, and pain.    ACTIVITY LIMITATIONS community activity and exercise and recreational activities .    PERSONAL FACTORS 1-2 comorbidities: 2 vaginal deliveries  are also affecting patient's functional outcome.      REHAB POTENTIAL: Excellent   CLINICAL DECISION MAKING: Evolving/moderate complexity   EVALUATION COMPLEXITY: Moderate     GOALS: Goals reviewed with patient? Yes   SHORT TERM GOALS: Target date: 03/09/2022  (Remove Blue Hyperlink),  updated 03/06/22    Ind with initial HEP Baseline: Goal status: met   2.  Pt will be able to isolate pelvic floor for kegel to strengthening muscles and coordinate correctly Baseline:  Goal status: Met 03/06/22        LONG TERM GOALS: Target date: 05/04/2022  (Remove Blue Hyperlink)  updated: 07/09/22  Pt will be independent with advanced HEP to maintain improvements made throughout therapy   Baseline:  Goal status: MET   2.  Pt will be able to functional actions such as  bending and coughing without leakage   Baseline: imporved Goal status: Partially met 3.  Pt will be able to lift at least 10 lb correctly for 10 reps without pain or leakage for functional activities  Baseline: 5lb done correctly for 2  sets of 10 Goal status: MET   4.  Pt will report 80% reduction of pain due to improvements in posture, strength, and muscle length and be able to participate in yoga or other exercises   Baseline: 50% improved Goal status: Partially met     PLAN: PT FREQUENCY: 1x/week   PT DURATION: 12 weeks   PLANNED INTERVENTIONS: Therapeutic exercises, Therapeutic activity, Neuromuscular re-education, Balance training, Gait training, Patient/Family education, Joint mobilization, Dry Needling, Electrical stimulation, Cryotherapy, Moist heat, Taping, Biofeedback, Manual therapy, and Re-evaluation   PLAN FOR NEXT SESSION: d/c today  Junious Silk, PT 07/16/2022, 2:00 PM   PHYSICAL THERAPY DISCHARGE SUMMARY  Visits from Start of Care: 9  Current functional level related to goals / functional outcomes: See goals   Remaining deficits: See above details   Education / Equipment: HEP   Patient agrees to discharge. Patient goals were partially met. Patient is being discharged due to being pleased with the current functional level. Also lack of progress with remaining goals resulted in d/c from PT.  Pt will follow up with her doctor about looking into why the flare ups continue to occur.  Russella Dar, PT 07/16/22 4:13 PM

## 2022-07-17 DIAGNOSIS — Z1231 Encounter for screening mammogram for malignant neoplasm of breast: Secondary | ICD-10-CM | POA: Diagnosis not present

## 2022-07-17 DIAGNOSIS — Z6830 Body mass index (BMI) 30.0-30.9, adult: Secondary | ICD-10-CM | POA: Diagnosis not present

## 2022-07-17 DIAGNOSIS — Z01419 Encounter for gynecological examination (general) (routine) without abnormal findings: Secondary | ICD-10-CM | POA: Diagnosis not present

## 2022-07-20 ENCOUNTER — Encounter: Payer: Self-pay | Admitting: Family Medicine

## 2022-07-20 ENCOUNTER — Encounter: Payer: Self-pay | Admitting: Sports Medicine

## 2022-07-20 ENCOUNTER — Other Ambulatory Visit: Payer: Self-pay | Admitting: Sports Medicine

## 2022-07-20 DIAGNOSIS — G5702 Lesion of sciatic nerve, left lower limb: Secondary | ICD-10-CM

## 2022-07-20 DIAGNOSIS — F419 Anxiety disorder, unspecified: Secondary | ICD-10-CM

## 2022-07-20 MED ORDER — BUPROPION HCL ER (XL) 300 MG PO TB24: 300.0000 mg | ORAL_TABLET | Freq: Every day | ORAL | 1 refills | Status: DC

## 2022-07-22 ENCOUNTER — Ambulatory Visit
Admission: RE | Admit: 2022-07-22 | Discharge: 2022-07-22 | Disposition: A | Discharge status: Home / Self Care | Payer: BC Managed Care – PPO | Source: Ambulatory Visit | Attending: Sports Medicine | Admitting: Sports Medicine

## 2022-07-22 DIAGNOSIS — R600 Localized edema: Secondary | ICD-10-CM | POA: Diagnosis not present

## 2022-07-22 DIAGNOSIS — M160 Bilateral primary osteoarthritis of hip: Secondary | ICD-10-CM | POA: Diagnosis not present

## 2022-07-22 DIAGNOSIS — N888 Other specified noninflammatory disorders of cervix uteri: Secondary | ICD-10-CM | POA: Diagnosis not present

## 2022-07-22 DIAGNOSIS — G5702 Lesion of sciatic nerve, left lower limb: Secondary | ICD-10-CM

## 2022-07-24 ENCOUNTER — Telehealth: Payer: Self-pay | Admitting: Sports Medicine

## 2022-07-24 ENCOUNTER — Other Ambulatory Visit: Payer: Self-pay | Admitting: Sports Medicine

## 2022-07-24 DIAGNOSIS — Z1589 Genetic susceptibility to other disease: Secondary | ICD-10-CM

## 2022-07-24 DIAGNOSIS — Z8261 Family history of arthritis: Secondary | ICD-10-CM

## 2022-07-24 DIAGNOSIS — M25551 Pain in right hip: Secondary | ICD-10-CM

## 2022-07-28 ENCOUNTER — Ambulatory Visit: Payer: BC Managed Care – PPO | Admitting: Sports Medicine

## 2022-07-28 ENCOUNTER — Encounter: Payer: Self-pay | Admitting: Sports Medicine

## 2022-07-28 DIAGNOSIS — M7062 Trochanteric bursitis, left hip: Secondary | ICD-10-CM

## 2022-07-28 DIAGNOSIS — M160 Bilateral primary osteoarthritis of hip: Secondary | ICD-10-CM | POA: Diagnosis not present

## 2022-07-28 NOTE — Progress Notes (Signed)
Alison [name] - 41 y.o. female MRN [id number]  Date of birth: 1981-07-26  Office Visit Note: Visit Date: 07/28/2022 PCP: Karie Georges, MD Referred by: Karie Georges, MD  Subjective: Chief Complaint  Patient presents with   Left Hip - Pain   HPI: Alison [name] is a pleasant 41 y.o. female who presents today for chronic bilateral hip pain, but moreso worsening left posterior lateral hip pain.  Had been treated for greater trochanteric pain and thought to have piriformis syndrome.  Has completed multiple physical therapy without much relief.  Did previous send a referral to rheumatology given her history of + HLA-B27 as well as family history of mother ankylosing spondylitis and family rheumatologic conditions.  Since last visit, had an MRI of the pelvis, see results below.  Her pain continues over the left posterior lateral hip and near the buttock region.  Pertinent ROS were reviewed with the patient and found to be negative unless otherwise specified above in HPI.   Assessment & Plan: Visit Diagnoses:  1. Greater trochanteric bursitis of left hip   2. Bilateral primary osteoarthritis of hip    Plan: Discussed with Dedorah her hip pain.  Her MRI does show some peritrochanteric edema, suggestions of greater trochanteric pain syndrome as well as some mild tendinosis of the distal left gluteus minimus tendon.  We did do a trial of extracorporeal shockwave therapy today.  We will see how she does with this, recommend following up in 1 week for repeat treatment and then evaluating if this is beneficial for her.  After our phone call last week, I did send a referral to rheumatology given her history and the multiple tendinosis and ailments throughout her pelvic region, she will see them in early December.  Discussed we could always consider an ultrasound-guided injection into the distal gluteus minimus tendon or around the trochanteric region, however would have her see rheumatology  first to obtain labs before doing this.  Follow-up in 1 week.  Follow-up: Return in about 1 year (around 07/29/2023) for week for repeat ECSWT.   Meds & Orders: No orders of the defined types were placed in this encounter.  No orders of the defined types were placed in this encounter.    Procedures: Procedure: ECSWT Indications:  Greater trochanteric bursitis, gluteus minimus tendinosus   Procedure Details Consent: Risks of procedure as well as the alternatives and risks of each were explained to the patient.  Verbal consent for procedure obtained. Time Out: Verified patient identification, verified procedure, site was marked, verified correct patient position. The area was cleaned with alcohol swab.     The left greater trochanter and gluteus minimus tendons was targeted for Extracorporeal shockwave therapy.    Preset: Trochanteric bursitis Power Level: 120 MJ Frequency: 10 Hz Impulse/cycles: 3000 Head size: Regular   Patient tolerated procedure well without immediate complications.         Clinical History: No specialty comments available.  She reports that she has never smoked. She has never used smokeless tobacco.  Recent Labs    12/05/21 1620  HGBA1C 5.4    Objective:    Physical Exam  Gen: Well-appearing, in no acute distress; non-toxic CV: Regular Rate. Well-perfused. Warm.  Resp: Breathing unlabored on room air; no wheezing. Psych: Fluid speech in conversation; appropriate affect; normal thought process Neuro: Sensation intact throughout. No gross coordination deficits.   Ortho Exam -Left hip: No blocks to internal or external rotation. + TTP over the posterior lateral  aspect of the greater trochanter. + Mild TTP near the gluteus medius and minimus mid belly and distal insertion.  There is some very mild weakness with resisted hip abduction of the left hip, otherwise full strength throughout. NVI.  Imaging: MR Pelvis w/o contrast CLINICAL DATA:  Pelvis  pain, stress fracture suspected, neg xray LEFT HIP PAIN left buttock and hip pain for 4 years, piriformis and  EXAM: MRI PELVIS WITHOUT CONTRAST  TECHNIQUE: Multiplanar multisequence MR imaging of the pelvis was performed. No intravenous contrast was administered.  COMPARISON:  Pelvis radiograph 04/30/2020  FINDINGS: Urinary Tract:  No abnormality visualized.  Bowel:  Unremarkable visualized pelvic bowel loops.  Vascular/Lymphatic: No pathologically enlarged lymph nodes. No significant vascular abnormality seen.  Reproductive:  Unremarkable for age.  Small nabothian cysts noted.  Other:  None.  Musculoskeletal: There is no evidence of acute fracture. There is no significant marrow signal alteration. No avascular necrosis. The SI joints unremarkable. There are moderate findings of chronic osteitis pubis, no current marrow edema there is no significant joint effusion. Mild hip chondrosis bilaterally. There is anterior superior acetabular subchondral cyst formation bilaterally. No evidence of labral tear. Symmetric sacral nerve roots normal in signal and caliber. Symmetric sciatic nerves, normal in signal and caliber.  There is mild tendinosis and adjacent peritrochanteric edema of the distal left gluteus minimus tendon. The gluteus medius is intact. Intact right gluteal tendons. The hip adductors are intact. There is mild tendinosis of the left proximal hamstrings. There is no intramuscular edema or muscle atrophy.  IMPRESSION: Mild tendinosis of the distal left gluteus minimus tendon with mild peritrochanteric edema.  Mild tendinosis of the left proximal hamstrings.  Mild bilateral hip osteoarthritis.  No acute osseous abnormality.  Electronically Signed   By: Caprice Renshaw M.D.   On: 07/22/2022 12:59    Past Medical/Family/Surgical/Social History: Medications & Allergies reviewed per EMR, new medications updated. Patient Active Problem List   Diagnosis Date  Noted   Hypertension 12/20/2016   Other fatigue 12/20/2016   Precordial chest pain 12/20/2016   Seasonal allergies 12/28/2014   Anxiety and depression 12/28/2014   Past Medical History:  Diagnosis Date   Anxiety and depression    Benign gestational thrombocytopenia (HCC) 12/03/2017   Cancer (HCC) 03/2014   basal carcinoma   Gestational hypertension 11/2011   Nephrolithiasis    Obstetrical laceration - bilateral perilabial splays 12/03/2017   Seasonal allergies    Family History  Problem Relation Age of Onset   Arthritis Mother    Ankylosing spondylitis Mother    Osteoporosis Mother 58   Arthritis Father    Other Father        car accident   Anxiety disorder Brother    Hypertension Brother    Arthritis Maternal Grandmother        Rheumatoid Arthritis   Breast cancer Maternal Grandmother 20   Healthy Half-Brother    Healthy Half-Sister    Diabetes Other    Hypertension Other        grandparent   Stroke Other        grandparent   Past Surgical History:  Procedure Laterality Date   MOHS SURGERY  03/2014   BCC on nose.   Social History   Occupational History    Comment: prn   Occupation:    Tobacco Use   Smoking status: Never   Smokeless tobacco: Never   Tobacco comments:    Married, lives with spouse. 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

## 2022-07-28 NOTE — Progress Notes (Signed)
Follow up on MRI;  Wants to discuss shockwave

## 2022-08-07 ENCOUNTER — Ambulatory Visit: Payer: BC Managed Care – PPO | Admitting: Sports Medicine

## 2022-08-07 ENCOUNTER — Other Ambulatory Visit: Payer: BC Managed Care – PPO

## 2022-08-07 ENCOUNTER — Other Ambulatory Visit: Payer: Self-pay | Admitting: *Deleted

## 2022-08-07 DIAGNOSIS — I10 Essential (primary) hypertension: Secondary | ICD-10-CM

## 2022-08-07 MED ORDER — LOSARTAN POTASSIUM 50 MG PO TABS: ORAL_TABLET | ORAL | 1 refills | Status: DC

## 2022-08-10 ENCOUNTER — Other Ambulatory Visit: Payer: Self-pay | Admitting: *Deleted

## 2022-08-10 DIAGNOSIS — I10 Essential (primary) hypertension: Secondary | ICD-10-CM

## 2022-08-10 MED ORDER — LOSARTAN POTASSIUM 50 MG PO TABS: ORAL_TABLET | ORAL | 1 refills | Status: DC

## 2022-08-10 NOTE — Telephone Encounter (Signed)
Rx done. 

## 2022-08-10 NOTE — Progress Notes (Signed)
Office Visit Note  Patient: Alison [name]             Date of Birth: May 05, 1981           MRN: [id number]             PCP: Karie Georges, MD Referring: Madelyn Brunner, DO Visit Date: 08/19/2022 Occupation: @GUAROCC @  Subjective:  Pain in left hip  History of Present Illness: Alison [name] is a 41 y.o. female returns today after her initial visit on April 30, 2020.  She was initially evaluated in 2016 for positive HLA-B27.  She continued to have shoulder joint discomfort, lower back pain and SI joint pain.  She had response to cortisone injection in the SI joints in the past.  She had MRI of her lumbar spine in the past which did not show any inflammation or  syndesmophytes.  SI joint x-rays obtained at the last visit were unremarkable.  She also gives history of recurrent trochanteric bursitis.  Stretching exercises were advised at the time.  She had facet joint arthropathy in the lower back.  Continues to have left-sided hip pain since her last visit in 2021.  She states over the last 1 year the pain has became more intense.  Describes the pain on the scale of 0-10 about 3-4.  She states the pain is worse in the morning and at night.  The pain is worse when she is laying on the left lateral side.  It gets better when she moves around.  She had recent MRI of her pelvis which showed tendinosis and osteoarthritic changes.  She has been also experiencing some discomfort in her bilateral arms.  None of the other joints are painful.  No history of psoriasis.  Activities of Daily Living:  Patient reports morning stiffness for 1-2 hours.   Patient Reports nocturnal pain.  Difficulty dressing/grooming: Denies Difficulty climbing stairs: Denies Difficulty getting out of chair: Denies Difficulty using hands for taps, buttons, cutlery, and/or writing: Denies  Review of Systems  Constitutional:  Positive for fatigue.  HENT:  Negative for mouth sores and mouth dryness.   Eyes:  Negative for  dryness.  Respiratory:  Negative for shortness of breath.   Cardiovascular:  Negative for chest pain and palpitations.  Gastrointestinal:  Negative for blood in stool, constipation and diarrhea.  Endocrine: Negative for increased urination.  Genitourinary:  Negative for involuntary urination.  Musculoskeletal:  Positive for myalgias, muscle weakness, morning stiffness, muscle tenderness and myalgias. Negative for joint pain, gait problem, joint pain and joint swelling.  Skin:  Positive for sensitivity to sunlight. Negative for color change, rash and hair loss.  Allergic/Immunologic: Negative for susceptible to infections.  Neurological:  Negative for dizziness and headaches.  Hematological:  Negative for swollen glands.  Psychiatric/Behavioral:  Negative for depressed mood and sleep disturbance. The patient is not nervous/anxious.     PMFS History:  Patient Active Problem List   Diagnosis Date Noted   Tendinopathy of gluteus medius 08/19/2022   Hypertension 12/20/2016   Other fatigue 12/20/2016   Precordial chest pain 12/20/2016   Seasonal allergies 12/28/2014   Anxiety and depression 12/28/2014    Past Medical History:  Diagnosis Date   Anxiety and depression    Benign gestational thrombocytopenia (HCC) 12/03/2017   Cancer (HCC) 03/2014   basal carcinoma   Gestational hypertension 11/2011   Nephrolithiasis    Obstetrical laceration - bilateral perilabial splays 12/03/2017   Seasonal allergies     Family  History  Problem Relation Age of Onset   Arthritis Mother    Ankylosing spondylitis Mother    Osteoporosis Mother 26   Arthritis Father    Other Father        car accident   Anxiety disorder Brother    Hypertension Brother    Arthritis Maternal Grandmother        Rheumatoid Arthritis   Breast cancer Maternal Grandmother 45   Healthy Half-Brother    Healthy Half-Sister    Diabetes Other    Hypertension Other        grandparent   Stroke Other        grandparent   Past  Surgical History:  Procedure Laterality Date   MOHS SURGERY  03/2014   BCC on nose.   Social History   Social History Narrative   Work or School: works part time as Veterinary surgeon - for th [employer] Situation: lives with son (5 yo) and husband      Spiritual Beliefs: Christian      Lifestyle: trying to get exercise; diet is fair      Immunization History  Administered Date(s) Administered   Influenza Whole 06/28/2009   Influenza-Unspecified 06/28/2016, 06/29/2019, 06/28/2021   Moderna Sars-Covid-2 Vaccination 09/28/2019, 10/26/2019, 10/03/2020   Td 09/29/2007   Tdap 12/31/2010, 09/28/2017     Objective: Vital Signs: BP (!) 136/95 (BP Location: Left Arm, Patient Position: Sitting, Cuff Size: Normal)   Pulse (!) 102   Resp 14   Ht 5\' 2"  (1.575 m)   Wt 171 lb (77.6 kg)   BMI 31.28 kg/m    Physical Exam Vitals and nursing note reviewed.  Constitutional:      Appearance: She is well-developed.  HENT:     Head: Normocephalic and atraumatic.  Eyes:     Conjunctiva/sclera: Conjunctivae normal.  Cardiovascular:     Rate and Rhythm: Normal rate and regular rhythm.     Heart sounds: Normal heart sounds.  Pulmonary:     Effort: Pulmonary effort is normal.     Breath sounds: Normal breath sounds.  Abdominal:     General: Bowel sounds are normal.     Palpations: Abdomen is soft.  Musculoskeletal:     Cervical back: Normal range of motion.  Lymphadenopathy:     Cervical: No cervical adenopathy.  Skin:    General: Skin is warm and dry.     Capillary Refill: Capillary refill takes less than 2 seconds.  Neurological:     Mental Status: She is alert and oriented to person, place, and time.  Psychiatric:        Behavior: Behavior normal.      Musculoskeletal Exam: Cervical, thoracic and lumbar spine were in good range of motion.  She had no SI joint tenderness.  Shoulder joints, elbow joints, wrist joints, MCPs PIPs and DIPs with good range of motion with no synovitis.   She had mild tenderness over insertion of the bilateral deltoid muscles.  Hip joints in good range of motion.  She had tenderness over bilateral trochanteric more prominent on the left side.  She had good range of motion of bilateral hip joints and knee joints.  There was no tenderness over ankles or MTPs.  CDAI Exam: CDAI Score: -- Patient Global: --; Provider Global: -- Swollen: --; Tender: -- Joint Exam 08/19/2022   No joint exam has been documented for this visit   There is currently no information documented on the homunculus. Go to  the Rheumatology activity and complete the homunculus joint exam.  Investigation: No additional findings.  Imaging: MR Pelvis w/o contrast  Result Date: 07/22/2022 CLINICAL DATA:  Pelvis pain, stress fracture suspected, neg xray LEFT HIP PAIN left buttock and hip pain for 4 years, piriformis and EXAM: MRI PELVIS WITHOUT CONTRAST TECHNIQUE: Multiplanar multisequence MR imaging of the pelvis was performed. No intravenous contrast was administered. COMPARISON:  Pelvis radiograph 04/30/2020 FINDINGS: Urinary Tract:  No abnormality visualized. Bowel:  Unremarkable visualized pelvic bowel loops. Vascular/Lymphatic: No pathologically enlarged lymph nodes. No significant vascular abnormality seen. Reproductive:  Unremarkable for age.  Small nabothian cysts noted. Other:  None. Musculoskeletal: There is no evidence of acute fracture. There is no significant marrow signal alteration. No avascular necrosis. The SI joints unremarkable. There are moderate findings of chronic osteitis pubis, no current marrow edema there is no significant joint effusion. Mild hip chondrosis bilaterally. There is anterior superior acetabular subchondral cyst formation bilaterally. No evidence of labral tear. Symmetric sacral nerve roots normal in signal and caliber. Symmetric sciatic nerves, normal in signal and caliber. There is mild tendinosis and adjacent peritrochanteric edema of the distal  left gluteus minimus tendon. The gluteus medius is intact. Intact right gluteal tendons. The hip adductors are intact. There is mild tendinosis of the left proximal hamstrings. There is no intramuscular edema or muscle atrophy. IMPRESSION: Mild tendinosis of the distal left gluteus minimus tendon with mild peritrochanteric edema. Mild tendinosis of the left proximal hamstrings. Mild bilateral hip osteoarthritis. No acute osseous abnormality. Electronically Signed   By: Caprice Renshaw M.D.   On: 07/22/2022 12:59    Recent Labs: Lab Results  Component Value Date   WBC 7.1 12/05/2021   HGB 13.5 12/05/2021   PLT 223 12/05/2021   NA 139 06/05/2022   K 3.7 06/05/2022   CL 103 06/05/2022   CO2 30 06/05/2022   GLUCOSE 88 06/05/2022   BUN 17 06/05/2022   CREATININE 0.94 06/05/2022   BILITOT 0.4 12/05/2021   ALKPHOS 119 12/03/2017   AST 20 12/05/2021   ALT 19 12/05/2021   PROT 7.0 12/05/2021   ALBUMIN 3.1 (L) 12/03/2017   CALCIUM 9.3 06/05/2022   GFRAA >60 12/03/2017    Speciality Comments: No specialty comments available.  Procedures:  Large Joint Inj: L greater trochanter on 08/19/2022 1:25 PM Indications: pain Details: 27 G 1.5 in needle, lateral approach  Arthrogram: No  Medications: 40 mg triamcinolone acetonide 40 MG/ML; 1.5 mL lidocaine 1 % Aspirate: 0 mL Outcome: tolerated well, no immediate complications Procedure, treatment alternatives, risks and benefits explained, specific risks discussed. Consent was given by the patient. Immediately prior to procedure a time out was called to verify the correct patient, procedure, equipment, support staff and site/side marked as required. Patient was prepped and draped in the usual sterile fashion.     Allergies: Patient has no known allergies.   Assessment / Plan:     Visit Diagnoses: HLA B27 positive - Family history of HLA-B27.  History of multiple arthralgias.  X-rays of SI joints were unremarkable in the past.  Recent MRI also  showed SI joints were normal.  She had no inflammatory arthritis or synovitis on examination.  Chronic SI joint pain - Chronic SI joint dysfunction.  X-rays were unremarkable in the past.  She responds to cortisone injections.  MRI July 22, 2022-tendinosis distal left gluteus  Trochanteric bursitis of both hips - She of recurrent trochanteric bursitis.  A handout on IT band stretching was given at  the last visit.  She continues to have pain and discomfort over bilateral trochanteric region more prominent on the left side.  She has been experiencing nocturnal pain and not been able to sleep well.  She was evaluated by Dr. Shon Baton recently who did shockwave therapy for left trochanteric bursitis.  Recent MRI was reviewed with the patient.  The MRI revealed left trochanteric bursitis, mild tendinosis of the distal gluteus minimus, mild tendinosis of left proximal hamstrings and mild bilateral hip osteoarthritis.  Patient reports severe nocturnal pain at nighttime when she is sleeping on the left side.  Different treatment options were discussed and after informed consent was obtained left trochanteric bursa was injected with lidocaine and cortisone as described above.  Postprocedure instructions were given.  She is also advised to monitor blood pressure closely.  Plan: Sedimentation rate.  I will contact her with the lab results.  I will also refer her to physical therapy.  Natural anti-inflammatories were discussed.  Primary osteoarthritis of both hips-mild osteoarthritis of both hips was noted on the MRI.  Weight loss diet and exercise was discussed.  She may benefit from water aerobics and swimming.  Dietary modifications were discussed.  Arthropathy of lumbar facet joint - History of chronic back pain.  She continues to have some lower back discomfort.  Chronic right shoulder pain -she has had right shoulder joint pain in the past.  Recently she has been experiencing pain over the bilateral deltoid  muscle insertion.  Both shoulder joints with good range of motion.  Range of motion exercises were discussed.  Primary hypertension-she is on losartan.  Her blood pressure was elevated at 142/101 when she came.  She states this because she of discomfort and not been sleeping well.  Repeat blood pressure was 136/95.  She was advised to monitor blood pressure closely and follow-up with her PCP if the blood pressure stays elevated.  Other fatigue-related to nocturnal pain.  Seasonal allergies  Anxiety and depression-she is on venlafaxine XR and bupropion XL.  Orders: Orders Placed This Encounter  Procedures   Large Joint Inj   Sedimentation rate   Ambulatory referral to Physical Therapy   No orders of the defined types were placed in this encounter.   Follow-Up Instructions: Return in about 6 months (around 02/17/2023).   Pollyann Savoy, MD  Note - This record has been created using Animal nutritionist.  Chart creation errors have been sought, but may not always  have been located. Such creation errors do not reflect on  the standard of medical care.

## 2022-08-13 ENCOUNTER — Ambulatory Visit: Payer: BC Managed Care – PPO | Admitting: Sports Medicine

## 2022-08-19 ENCOUNTER — Encounter: Payer: Self-pay | Admitting: Rheumatology

## 2022-08-19 ENCOUNTER — Ambulatory Visit: Payer: BC Managed Care – PPO | Attending: Rheumatology | Admitting: Rheumatology

## 2022-08-19 VITALS — BP 136/95 | HR 102 | Resp 14 | Ht 62.0 in | Wt 171.0 lb

## 2022-08-19 DIAGNOSIS — M7062 Trochanteric bursitis, left hip: Secondary | ICD-10-CM

## 2022-08-19 DIAGNOSIS — Z8269 Family history of other diseases of the musculoskeletal system and connective tissue: Secondary | ICD-10-CM

## 2022-08-19 DIAGNOSIS — M533 Sacrococcygeal disorders, not elsewhere classified: Secondary | ICD-10-CM | POA: Diagnosis not present

## 2022-08-19 DIAGNOSIS — M160 Bilateral primary osteoarthritis of hip: Secondary | ICD-10-CM

## 2022-08-19 DIAGNOSIS — F419 Anxiety disorder, unspecified: Secondary | ICD-10-CM

## 2022-08-19 DIAGNOSIS — G8929 Other chronic pain: Secondary | ICD-10-CM

## 2022-08-19 DIAGNOSIS — M461 Sacroiliitis, not elsewhere classified: Secondary | ICD-10-CM

## 2022-08-19 DIAGNOSIS — I10 Essential (primary) hypertension: Secondary | ICD-10-CM

## 2022-08-19 DIAGNOSIS — M67959 Unspecified disorder of synovium and tendon, unspecified thigh: Secondary | ICD-10-CM | POA: Insufficient documentation

## 2022-08-19 DIAGNOSIS — M47816 Spondylosis without myelopathy or radiculopathy, lumbar region: Secondary | ICD-10-CM

## 2022-08-19 DIAGNOSIS — Z1589 Genetic susceptibility to other disease: Secondary | ICD-10-CM | POA: Diagnosis not present

## 2022-08-19 DIAGNOSIS — J302 Other seasonal allergic rhinitis: Secondary | ICD-10-CM

## 2022-08-19 DIAGNOSIS — M7061 Trochanteric bursitis, right hip: Secondary | ICD-10-CM | POA: Diagnosis not present

## 2022-08-19 DIAGNOSIS — R5383 Other fatigue: Secondary | ICD-10-CM

## 2022-08-19 DIAGNOSIS — M25511 Pain in right shoulder: Secondary | ICD-10-CM

## 2022-08-19 DIAGNOSIS — M4696 Unspecified inflammatory spondylopathy, lumbar region: Secondary | ICD-10-CM

## 2022-08-19 DIAGNOSIS — F32A Depression, unspecified: Secondary | ICD-10-CM

## 2022-08-19 MED ORDER — TRIAMCINOLONE ACETONIDE 40 MG/ML IJ SUSP
40.0000 mg | INTRAMUSCULAR | Status: AC | PRN
  Administered 2022-08-19: 40 mg via INTRA_ARTICULAR

## 2022-08-19 MED ORDER — LIDOCAINE HCL 1 % IJ SOLN
1.5000 mL | INTRAMUSCULAR | Status: AC | PRN
  Administered 2022-08-19: 1.5 mL

## 2022-08-20 LAB — SEDIMENTATION RATE: Sed Rate: 11 mm/h (ref 0–20)

## 2022-08-20 NOTE — Progress Notes (Signed)
Sed rate normal.

## 2022-08-22 ENCOUNTER — Telehealth: Payer: BC Managed Care – PPO | Admitting: Urgent Care

## 2022-08-22 DIAGNOSIS — I10 Essential (primary) hypertension: Secondary | ICD-10-CM | POA: Diagnosis not present

## 2022-08-22 NOTE — Progress Notes (Signed)
Virtual Visit Consent   Alison [name], you are scheduled for a virtual visit with a [facility] provider today. Just as with appointments in the office, your consent must be obtained to participate. Your consent will be active for this visit and any virtual visit you may have with one of our providers in the next 365 days. If you have a MyChart account, a copy of this consent can be sent to you electronically.  As this is a virtual visit, video technology does not allow for your provider to perform a traditional examination. This may limit your provider's ability to fully assess your condition. If your provider identifies any concerns that need to be evaluated in person or the need to arrange testing (such as labs, EKG, etc.), we will make arrangements to do so. Although advances in technology are sophisticated, we cannot ensure that it will always work on either your end or our end. If the connection with a video visit is poor, the visit may have to be switched to a telephone visit. With either a video or telephone visit, we are not always able to ensure that we have a secure connection.  By engaging in this virtual visit, you consent to the provision of healthcare and authorize for your insurance to be billed (if applicable) for the services provided during this visit. Depending on your insurance coverage, you may receive a charge related to this service.  I need to obtain your verbal consent now. Are you willing to proceed with your visit today? Alda Gautreaux Whitmill has provided verbal consent on 08/22/2022 for a virtual visit (video or telephone). Maretta Bees, PA  Date: 08/22/2022 5:50 PM  Virtual Visit via Video Note   I, Ketsia Linebaugh L Jania Steinke, connected with  SUANNE DAMBOISE  ([id number], Feb 07, 1981) on 08/22/22 at  5:30 PM EST by a video-enabled telemedicine application and verified that I am speaking with the correct person using two identifiers.  Location: Patient: Virtual Visit Location Patient:  Home Provider: Virtual Visit Location Provider: Home Office   I discussed the limitations of evaluation and management by telemedicine and the availability of in person appointments. The patient expressed understanding and agreed to proceed.    History of Present Illness: Alison [name] is a 41 y.o. who identifies as a female who was assigned female at birth, and is being seen today for elevated blood pressure.  HPI: Pleasant 41yo female presents today with concern of her BP. Had a rheumatology appointment on 08/19/22 and was told that her BP was 142/101. They rechecked it several times at the office with the lowest being 136/95. Pt was tiven a Triamcinolone shot into her hip for bursitis. Pt reports BP has seemed to be high since then, higher today than yesterday. Systolic was 150 at its highest, 104 diastolic today. Pt denies headache, blurred vision, tinnitus, or dizziness. She reports some anxiety driven chest pains, but otherwise denies CP/ palps. Pt admits she recently got a new BP cuff and is uncertain if this could also be the cause for the noted numbers. Pt admits, prior to her 08/19/22 rheum appointment, she had not been checking her BP often. She recently had to cut back on her venlafaxine as the higher dose caused elevated BP and sweating. One month ago, she increased her buproprion, but had not checked since. Is uncertain if this is why BP is higher.     Problems:  Patient Active Problem List   Diagnosis Date Noted   Tendinopathy  of gluteus medius 08/19/2022   Hypertension 12/20/2016   Other fatigue 12/20/2016   Precordial chest pain 12/20/2016   Seasonal allergies 12/28/2014   Anxiety and depression 12/28/2014    Allergies: No Known Allergies Medications:  Current Outpatient Medications:    buPROPion (WELLBUTRIN XL) 300 MG 24 hr tablet, Take 1 tablet (300 mg total) by mouth daily., Disp: 90 tablet, Rfl: 1   Cholecalciferol (VITAMIN D PO), Take by mouth., Disp: , Rfl:     losartan (COZAAR) 50 MG tablet, TAKE 1 TABLET(50 MG) BY MOUTH DAILY, Disp: 90 tablet, Rfl: 1   Multiple Vitamin (MULTIVITAMIN) capsule, Take 1 capsule by mouth daily., Disp: , Rfl:    venlafaxine XR (EFFEXOR-XR) 150 MG 24 hr capsule, TAKE 1 CAPSULE BY MOUTH EVERY DAY WITH 75mg  to total 225mg  daily dose, Disp: 90 capsule, Rfl: 1  Observations/Objective: Patient is well-developed, well-nourished in no acute distress.  Resting comfortably at home. No diaphoresis. Pt smiling. Head is normocephalic, atraumatic.  No labored breathing. No cough. Speech is clear and coherent with logical content.  Patient is alert and oriented at baseline.    Assessment and Plan: 1. Uncontrolled hypertension  Pt with newly noted elevations in her BP. She believes this to most likely be secondary to her dosage change in wellbutrin. Will have pt cut the medication in half and take 150mg  wellbutrin for the next several days. Continue losartan once daily. Check BP once in the morning 1-2 hours after taking the losartan. If BP remains elevated, please discuss possibly switching to buspirone as this medication has excellent indications for anxiety and BP control. Additional BP medication today is not warranted, but rather additional monitoring.  Follow Up Instructions: I discussed the assessment and treatment plan with the patient. The patient was provided an opportunity to ask questions and all were answered. The patient agreed with the plan and demonstrated an understanding of the instructions.  A copy of instructions were sent to the patient via MyChart unless otherwise noted below.    The patient was advised to call back or seek an in-person evaluation if the symptoms worsen or if the condition fails to improve as anticipated.  Time:  I spent 10 minutes with the patient via telehealth technology discussing the above problems/concerns.    Maryl Blalock L Elbert Spickler, PA

## 2022-08-22 NOTE — Patient Instructions (Addendum)
  Alison [name], thank you for joining Chaney Malling, PA for today's virtual visit.  While this provider is not your primary care provider (PCP), if your PCP is located in our provider database this encounter information will be shared with them immediately following your visit.   [facility] account gives you access to today's visit and all your visits, tests, and labs performed at [facility] " click here if you don't have a [facility] account or go to [email or web address]  Consent: (Patient) Alison [name] provided verbal consent for this virtual visit at the beginning of the encounter.  Current Medications:  Current Outpatient Medications:    buPROPion (WELLBUTRIN XL) 300 MG 24 hr tablet, Take 1 tablet (300 mg total) by mouth daily., Disp: 90 tablet, Rfl: 1   Cholecalciferol (VITAMIN D PO), Take by mouth., Disp: , Rfl:    losartan (COZAAR) 50 MG tablet, TAKE 1 TABLET(50 MG) BY MOUTH DAILY, Disp: 90 tablet, Rfl: 1   Multiple Vitamin (MULTIVITAMIN) capsule, Take 1 capsule by mouth daily., Disp: , Rfl:    venlafaxine XR (EFFEXOR-XR) 150 MG 24 hr capsule, TAKE 1 CAPSULE BY MOUTH EVERY DAY WITH '75mg'$  to total '225mg'$  daily dose, Disp: 90 capsule, Rfl: 1   Medications ordered in this encounter:  No orders of the defined types were placed in this encounter.    *If you need refills on other medications prior to your next appointment, please contact your pharmacy*  Follow-Up: Call back or seek an in-person evaluation if the symptoms worsen or if the condition fails to improve as anticipated.  [place] [phone]  Other Instructions Please continue to monitor your BP. Best to take this 1-2 hours after taking your BP medication in the morning. Cut your buproprion back to '150mg'$  daily to see if this is related since you had BP issues on higher doses of venlafaxine.  Some people are extremely sensitive to salt as well as the cause of elevated  BP. Please monitor the sodium content of the foods you have been eating, cut back on processed meats and foods and see if this also has an effect on bp. I have attached several handouts for you to review.   If you have been instructed to have an in-person evaluation today at a local Urgent Care facility, please use the link below. It will take you to a list of all of our available [facility], including address, phone number and hours of operation. Please do not delay care.  [facility]  If you or a family member do not have a primary care provider, use the link below to schedule a visit and establish care. When you choose a [facility] primary care physician or advanced practice provider, you gain a long-term partner in health. Find a Primary Care Provider  Learn more about [facility]'s in-office and virtual care options: [facility]

## 2022-09-08 ENCOUNTER — Ambulatory Visit: Payer: BC Managed Care – PPO | Admitting: Rheumatology

## 2022-09-10 ENCOUNTER — Ambulatory Visit: Payer: BC Managed Care – PPO | Admitting: Physical Therapy

## 2022-09-10 ENCOUNTER — Encounter: Payer: Self-pay | Admitting: Physical Therapy

## 2022-09-17 ENCOUNTER — Ambulatory Visit: Payer: BC Managed Care – PPO | Attending: Rheumatology | Admitting: Physical Therapy

## 2022-09-17 DIAGNOSIS — R279 Unspecified lack of coordination: Secondary | ICD-10-CM | POA: Diagnosis not present

## 2022-09-17 DIAGNOSIS — M6281 Muscle weakness (generalized): Secondary | ICD-10-CM | POA: Diagnosis not present

## 2022-09-17 DIAGNOSIS — M7061 Trochanteric bursitis, right hip: Secondary | ICD-10-CM | POA: Diagnosis not present

## 2022-09-17 DIAGNOSIS — M7062 Trochanteric bursitis, left hip: Secondary | ICD-10-CM | POA: Diagnosis not present

## 2022-09-17 NOTE — Therapy (Addendum)
OUTPATIENT PHYSICAL THERAPY FEMALE PELVIC EVALUATION   Patient Name: Alison [name] MRN: [id number] DOB:November 17, 1980, 41 y.o., female Today's Date: 09/18/2022  END OF SESSION:  PT End of Session - 09/18/22 1011     Visit Number 1    Date for PT Re-Evaluation 12/10/22    Authorization Type BCBS    PT Start Time 1400    PT Stop Time 1440    PT Time Calculation (min) 40 min    Activity Tolerance Patient tolerated treatment well    Behavior During Therapy [facility] for tasks assessed/performed             Past Medical History:  Diagnosis Date   Anxiety and depression    Benign gestational thrombocytopenia (HCC) 12/03/2017   Cancer (HCC) 03/2014   basal carcinoma   Gestational hypertension 11/2011   Nephrolithiasis    Obstetrical laceration - bilateral perilabial splays 12/03/2017   Seasonal allergies    Past Surgical History:  Procedure Laterality Date   MOHS SURGERY  03/2014   BCC on nose.   Patient Active Problem List   Diagnosis Date Noted   Tendinopathy of gluteus medius 08/19/2022   Hypertension 12/20/2016   Other fatigue 12/20/2016   Precordial chest pain 12/20/2016   Seasonal allergies 12/28/2014   Anxiety and depression 12/28/2014    PCP: Karie Georges, MD  REFERRING PROVIDER: Pollyann Savoy, MD  REFERRING DIAG: M70.61,M70.62 (ICD-10-CM) - Trochanteric bursitis of both hip   THERAPY DIAG:  Unspecified lack of coordination  Muscle weakness (generalized)  Rationale for Evaluation and Treatment: Rehabilitation  ONSET DATE: insidious  SUBJECTIVE:                                                                                                                                                                                           SUBJECTIVE STATEMENT: I had an MRI showing the bursitis and inflammation in the pelvis and OA of bil hips.  It showed tendinosis of gluteal attachments as well.  I just want to make sure I am doing everything I can for these  issues. Fluid intake:    PAIN:  Are you having pain? Yes NPRS scale: 3/10 Pain location: Left and buttock  Pain type: aching Pain description: intermittent   Aggravating factors: nothing flared up since the flare Relieving factors: moving a little   PRECAUTIONS: None  WEIGHT BEARING RESTRICTIONS: No  FALLS:  Has patient fallen in last 6 months? No  LIVING ENVIRONMENT: Lives with: lives with their family Lives in: House/apartment   OCCUPATION: active  PLOF: Independent  PATIENT GOALS: reduce pain and get exercise plan  PERTINENT HISTORY:  Gene that causes joint/inflammation issues;  Sexual abuse: No  BOWEL MOVEMENT: Pain with bowel movement: No   URINATION: Pain with urination: No Leakage:  no   INTERCOURSE: Pain with intercourse:  No  PREGNANCY: Vaginal deliveries 2 Tearing No   PROLAPSE: None   OBJECTIVE:   DIAGNOSTIC FINDINGS:  MRI bil OA hips (mild); tendinosis left glute med, bil trochanteric bursitis  PATIENT SURVEYS:    PFIQ-7   COGNITION: Overall cognitive status: Within functional limits for tasks assessed     SENSATION:   MUSCLE LENGTH: Hamstrings: Right full deg; Left 90% deg Maisie Fus test: WFL   LUMBAR SPECIAL TESTS:  Straight leg raise test: Negative  FUNCTIONAL TESTS:  Single leg - normal  GAIT:  Comments: WFL   POSTURE: anterior pelvic tilt  PELVIC ALIGNMENT: normal  LUMBARAROM/PROM:  A/PROM A/PROM  eval  Flexion WFL   Extension   Right lateral flexion   Left lateral flexion   Right rotation   Left rotation    (Blank rows = not tested)  LOWER EXTREMITY ROM:  Passive ROM Right eval Left eval  Hip flexion [facility]   Hip extension    Hip abduction    Hip adduction    Hip internal rotation 75% 75%  Hip external rotation 80% 80%  Knee flexion    Knee extension    Ankle dorsiflexion    Ankle plantarflexion    Ankle inversion    Ankle eversion     (Blank rows = not tested)  LOWER EXTREMITY  MMT:  MMT Right eval Left eval  Hip flexion 5 5  Hip extension 5 4/5  Hip abduction 5 4/5  Hip adduction 5 5  Hip internal rotation 5 5  Hip external rotation 5 4/5  Knee flexion    Knee extension    Ankle dorsiflexion    Ankle plantarflexion    Ankle inversion    Ankle eversion     PALPATION:   General:  sacral ligaments tender on left and normal on Rt side; bursa tender at Lt greater trochanter   Patient confirms identification and approves PT to assess internal pelvic floor and treatment No  PELVIC MMT:   MMT eval  Vaginal   Internal Anal Sphincter   External Anal Sphincter   Puborectalis   Diastasis Recti   (Blank rows = not tested)        TONE: NA  PROLAPSE: NA  TODAY'S TREATMENT:                                                                                                                              DATE: 09/17/22              EVAL and reviewed HEP that pt already had and gave updates for hip abduction and ER   PATIENT EDUCATION:  Education details: [email or web address] Person educated: Patient Education method: Explanation, Demonstration, Tactile cues, Verbal cues, and Handouts Education comprehension: verbalized understanding  and returned demonstration  HOME EXERCISE PROGRAM: Access Code: [email or web address] URL: [email or web address] Date: 09/18/2022 Prepared by: Dwana Curd  Exercises - Diaphragmatic Breathing in Child's Pose with Pelvic Floor Relaxation  - 1 x daily - 7 x weekly - 1 sets - 5 reps - 30 sec hold - Child's Pose with Thread the Needle  - 1 x daily - 7 x weekly - 1 sets - 3 reps - Pigeon Pose  - 1 x daily - 7 x weekly - 1 sets - 3 reps - Quadruped Alternating Arm Lift  - 1 x daily - 7 x weekly - 2 sets - 10 reps - Isometric Dead Bug  - 1 x daily - 7 x weekly - 3 sets - 10 reps - Primal Push Up  - 1 x daily - 7 x weekly - 3 sets - 10 reps - Squatting Anti-Rotation Press  - 1 x daily - 7 x weekly - 3 sets - 10 reps - Supine SI  Joint Self-Correction  - 1 x daily - 7 x weekly - 3 sets - 10 reps - Half Kneeling Hip Flexor Stretch with Chair  - 1 x daily - 7 x weekly - 3 sets - 10 reps - Modified Single-Leg Deadlift  - 1 x daily - 7 x weekly - 3 sets - 10 reps - Thomas Stretch on Table  - 1 x daily - 7 x weekly - 3 sets - 10 reps - Sidelying Hip Abduction at Wall  - 1 x daily - 7 x weekly - 3 sets - 5 reps - Clam with Resistance  - 1 x daily - 7 x weekly - 3 sets - 10 reps  Patient Education - Trigger Point Dry Needling  ASSESSMENT:  CLINICAL IMPRESSION: Patient is a 41 y.o. female who was seen today for physical therapy evaluation and treatment for bilateral hip pain. Pt has inflammation from multiple areas in bil hips as noted on MRI images.  Pt is at PT to understand ways to strengthen to avoid increased inflammation and reduce pain.  She demonstrates some pain and weakness as noted above, particularly the left gluteal muscles. Pt was educated on and given exercises today in order to address impairments. She will benefit from skilled PT for several sessions as needed to ensure she is strengthening with correct posture and body mechanics for reduced risk of flare up.  OBJECTIVE IMPAIRMENTS: decreased coordination, decreased endurance, decreased strength, increased muscle spasms, impaired flexibility, impaired tone, postural dysfunction, and pain.   ACTIVITY LIMITATIONS: lifting and standing  PARTICIPATION LIMITATIONS: community activity and exercise  PERSONAL FACTORS: Past/current experiences and 1 comorbidity: 2 vaginal deliveries, inherited gene causing inflammation  are also affecting patient's functional outcome.   REHAB POTENTIAL: Good  CLINICAL DECISION MAKING: Stable/uncomplicated  EVALUATION COMPLEXITY: Low   GOALS: Goals reviewed with patient? Yes    LONG TERM GOALS: Target date: 12/10/22  Pt will be independent with advanced HEP to maintain improvements made throughout therapy  Baseline:  Goal  status: INITIAL  2.  Pt will report 75% reduction of pain due to improvements in posture, strength, and muscle length  Baseline:  Goal status: INITIAL    PLAN:  PT FREQUENCY: 2x/month   PT DURATION: 12 weeks  PLANNED INTERVENTIONS: Therapeutic exercises, Therapeutic activity, Neuromuscular re-education, Balance training, Gait training, Patient/Family education, Self Care, Joint mobilization, Dry Needling, Electrical stimulation, Cryotherapy, Moist heat, Taping, Biofeedback, Manual therapy, and Re-evaluation  PLAN FOR NEXT SESSION: hip and gluteal strengthening   Sallyanne Havers  L Codi Folkerts, PT 09/18/2022, 10:13 AM   PHYSICAL THERAPY DISCHARGE SUMMARY  Visits from Start of Care: 1  Current functional level related to goals / functional outcomes: See above goals   Remaining deficits: See above   Education / Equipment: HEP discussed   Patient agrees to discharge. Patient goals were not met. Patient is being discharged due to not returning since the last visit. Had discussed this as potentially 1x visit  Russella Dar, PT, DPT 03/30/23 10:35 AM

## 2022-09-18 ENCOUNTER — Other Ambulatory Visit: Payer: Self-pay

## 2022-09-18 ENCOUNTER — Encounter: Payer: Self-pay | Admitting: Physical Therapy

## 2022-09-29 ENCOUNTER — Ambulatory Visit: Payer: BC Managed Care – PPO | Admitting: Family Medicine

## 2022-09-29 VITALS — BP 124/84 | HR 87 | Temp 98.2°F | Ht 62.0 in | Wt 174.5 lb

## 2022-09-29 DIAGNOSIS — I10 Essential (primary) hypertension: Secondary | ICD-10-CM

## 2022-09-29 DIAGNOSIS — J029 Acute pharyngitis, unspecified: Secondary | ICD-10-CM | POA: Diagnosis not present

## 2022-09-29 DIAGNOSIS — F32A Depression, unspecified: Secondary | ICD-10-CM | POA: Diagnosis not present

## 2022-09-29 DIAGNOSIS — F419 Anxiety disorder, unspecified: Secondary | ICD-10-CM | POA: Diagnosis not present

## 2022-09-29 LAB — POCT RAPID STREP A (OFFICE): Rapid Strep A Screen: NEGATIVE

## 2022-09-29 LAB — POC COVID19 BINAXNOW: SARS Coronavirus 2 Ag: NEGATIVE

## 2022-09-29 MED ORDER — VENLAFAXINE HCL ER 75 MG PO CP24: 75.0000 mg | ORAL_CAPSULE | Freq: Every day | ORAL | 1 refills | Status: DC

## 2022-09-29 NOTE — Assessment & Plan Note (Signed)
I advised we continue the 300 mg daily of the bupropion and reduce the effexor to 75 mg daily to help reduce her BP readings. Pt is agreeable to this.  Rx sent for the 75 mg daily effexor.

## 2022-09-29 NOTE — Assessment & Plan Note (Signed)
BP today is in the normal range. I advised that we should reduce the effexor to 75 mg daily since she is feeling better on the 300 mg daily of bupropion. The effexor is likely causing more issues with the BP than the bupropion. Patient will continue to monitor her BP at home daily. I will see her back in 6 months for follow up.

## 2022-09-29 NOTE — Progress Notes (Signed)
Established Patient Office Visit  Subjective   Patient ID: Alison [name], female    DOB: [birth year]  Age: 42 y.o. MRN: [id number]  Chief Complaint  Patient presents with   Hypertension    X6 weeks, states her readings have been 140/90, patient states she decreased the dose of Bupropion due to instructions from e-visit   Sore Throat    X3 days    In sept the bupropion was started and her BP has been intermittently high for the past 3 months. States that the effexor at 150 mg daily and the bupropion at 300 mg was working well, however she had some elevated BP readings. States she was told by another provider to reduce the bupropion to 150 mg daily because of the elevated readings. We discussed that effexor likely has more of an effect on her blood pressure and we discussed reducing the dose to 75 mg daily and going back up to 300 mg daily on the bupropion.   Pt is reporting 1 week history of sore throat, states that it is hurting on the left side and it hurts to swallow. She denies any fever/chills, no known sick contacts, no chest pain, ear pain, maybe some mild post nasal drip. Strep and COVID are negative today.      Review of Systems  All other systems reviewed and are negative.     Objective:     BP 124/84 (BP Location: Left Arm, Patient Position: Sitting, Cuff Size: Normal)   Pulse 87   Temp 98.2 F (36.8 C) (Oral)   Ht 5\' 2"  (1.575 m)   Wt 174 lb 8 oz (79.2 kg)   LMP 09/21/2022 (Exact Date)   SpO2 97%   BMI 31.92 kg/m    Physical Exam Vitals reviewed.  Constitutional:      Appearance: Normal appearance. She is well-groomed and normal weight.  HENT:     Right Ear: Tympanic membrane normal.     Left Ear: Tympanic membrane normal.     Mouth/Throat:     Mouth: Mucous membranes are moist.     Pharynx: No pharyngeal swelling or posterior oropharyngeal erythema.     Tonsils: No tonsillar exudate or tonsillar abscesses.  Eyes:     Conjunctiva/sclera: Conjunctivae  normal.  Cardiovascular:     Rate and Rhythm: Normal rate and regular rhythm.     Pulses: Normal pulses.     Heart sounds: S1 normal and S2 normal.  Pulmonary:     Effort: Pulmonary effort is normal.     Breath sounds: Normal breath sounds and air entry.  Abdominal:     General: Bowel sounds are normal.  Musculoskeletal:     Right lower leg: No edema.     Left lower leg: No edema.  Lymphadenopathy:     Cervical: No cervical adenopathy.  Neurological:     Mental Status: She is alert and oriented to person, place, and time. Mental status is at baseline.     Gait: Gait is intact.  Psychiatric:        Mood and Affect: Mood and affect normal.        Speech: Speech normal.        Behavior: Behavior normal.        Judgment: Judgment normal.      Results for orders placed or performed in visit on 09/29/22  POC Rapid Strep A  Result Value Ref Range   Rapid Strep A Screen Negative Negative  POC COVID-19  Result  Value Ref Range   SARS Coronavirus 2 Ag Negative Negative      The 10-year ASCVD risk score (Arnett DK, et al., 2019) is: 0.9%    Assessment & Plan:   Problem List Items Addressed This Visit       Unprioritized   Anxiety and depression    I advised we continue the 300 mg daily of the bupropion and reduce the effexor to 75 mg daily to help reduce her BP readings. Pt is agreeable to this.  Rx sent for the 75 mg daily effexor.      Relevant Medications   venlafaxine XR (EFFEXOR XR) 75 MG 24 hr capsule   Hypertension    BP today is in the normal range. I advised that we should reduce the effexor to 75 mg daily since she is feeling better on the 300 mg daily of bupropion. The effexor is likely causing more issues with the BP than the bupropion. Patient will continue to monitor her BP at home daily. I will see her back in 6 months for follow up.      Other Visit Diagnoses     Sore throat    -  Primary   Relevant Orders   POC Rapid Strep A (Completed)   POC  COVID-19 (Completed)  Physical exam is benign, most likely the sore throat is from PND or possibly a mild viral illness.      Return in about 6 months (around 03/30/2023).    Karie Georges, MD

## 2022-10-01 DIAGNOSIS — L700 Acne vulgaris: Secondary | ICD-10-CM | POA: Diagnosis not present

## 2022-10-01 DIAGNOSIS — Z85828 Personal history of other malignant neoplasm of skin: Secondary | ICD-10-CM | POA: Diagnosis not present

## 2022-10-01 DIAGNOSIS — L281 Prurigo nodularis: Secondary | ICD-10-CM | POA: Diagnosis not present

## 2022-10-01 DIAGNOSIS — D225 Melanocytic nevi of trunk: Secondary | ICD-10-CM | POA: Diagnosis not present

## 2022-10-15 ENCOUNTER — Ambulatory Visit: Payer: BC Managed Care – PPO | Admitting: Sports Medicine

## 2022-10-16 ENCOUNTER — Encounter: Payer: Self-pay | Admitting: Sports Medicine

## 2022-10-16 ENCOUNTER — Ambulatory Visit: Payer: BC Managed Care – PPO | Admitting: Sports Medicine

## 2022-10-16 DIAGNOSIS — M12811 Other specific arthropathies, not elsewhere classified, right shoulder: Secondary | ICD-10-CM | POA: Diagnosis not present

## 2022-10-16 DIAGNOSIS — M7062 Trochanteric bursitis, left hip: Secondary | ICD-10-CM | POA: Diagnosis not present

## 2022-10-16 DIAGNOSIS — M12812 Other specific arthropathies, not elsewhere classified, left shoulder: Secondary | ICD-10-CM | POA: Diagnosis not present

## 2022-10-16 DIAGNOSIS — Z1589 Genetic susceptibility to other disease: Secondary | ICD-10-CM | POA: Diagnosis not present

## 2022-10-16 MED ORDER — MELOXICAM 15 MG PO TABS: 15.0000 mg | ORAL_TABLET | Freq: Every day | ORAL | 1 refills | Status: DC

## 2022-10-16 NOTE — Progress Notes (Signed)
Alison [name] - 42 y.o. female MRN [id number]  Date of birth: 20-Mar-1981  Office Visit Note: Visit Date: 10/16/2022 PCP: Alison Georges, MD Referred by: Alison Georges, MD  Subjective: Chief Complaint  Patient presents with   Left Hip - Pain, Follow-up   HPI: Alison [name] is a pleasant 42 y.o. female who presents today for follow-up of left hip pain; also follow-up of rheumatology visit; bilateral shoulder pain.  Her left lateral hip pain still bothers her at times, although it is certainly improved with the physical therapy, rehab.  Also got a greater trochanteric injection on 08/19/2022 which did help improve her pain.  Strength is also improved.  She is having some bilateral shoulder pain near the proximal deltoid region bilaterally, the left is worse than right.  No specific injury.  Denies any change in activity or overuse.  She does point to Codman's only.  No redness or swelling.  Has taken ibuprofen in the past, but would not like to take this consistently given side effects.  Review of labs from 12/05/2021 showed a TSH within normal limits, vitamin D normal at 48, borderline low ferritin of 23.  Review of rheumatology note from 08/17/2022 by Alison [name] -history of HLA-B27 positive.  Reviewed MRI which did not show any inflammation or syndesmophytes.  Did provide trochanteric bursa injection.  Did not seem suspicious for inflammatory arthropathy at this time.  Review of ESR lab result from 08/19/2022 of 11 (wnl)  Pertinent ROS were reviewed with the patient and found to be negative unless otherwise specified above in HPI.   Assessment & Plan: Visit Diagnoses:  1. Rotator cuff arthropathy of both shoulders   2. Greater trochanteric bursitis of left hip   3. HLA B27 (HLA B27 positive)    Plan: Discussed with Alison [name] that I am pleased with her improvements in strength and her lessening of her pain with her physical therapy for the lateral hip abductors.  I think she  should continue with the therapy for the hip.  Her examination of both her right and left shoulder is most indicative of rotator cuff arthropathy, although I do not have a high suspicion for tearing as she has excellent strength.  Both of these do seem to have somewhat of an inflammatory origin.  Given this, we will do a trial of meloxicam 15 mg to be taken once daily for the next 3 weeks, then may transition to only as needed.  I did review previous lab work which was negative for inflammatory markers, normal TSH.  At this time do not have the exact reason for some of her ailments, but previous labs suggest against a systemic cause.  Review of previous MRI is not diagnostic that this is related to her HLA-B27 marker. She will f/u in 6 weeks. May consider ECSWT or injection therapy in the future if hip or shoulder pain continues despite above treatment.  Ferritin 23 - borderline low.  Follow-up: Return in about 6 weeks (around 11/27/2022).   Meds & Orders:  Meds ordered this encounter  Medications   meloxicam (MOBIC) 15 MG tablet    Sig: Take 1 tablet (15 mg total) by mouth daily.    Dispense:  30 tablet    Refill:  1   No orders of the defined types were placed in this encounter.    Procedures: No procedures performed      Clinical History: No specialty comments available.  She reports that she has never  smoked. She has never been exposed to tobacco smoke. She has never used smokeless tobacco.  Recent Labs    12/05/21 1620  HGBA1C 5.4    Objective:   Vital Signs: LMP 09/21/2022 (Exact Date)   Physical Exam  Gen: Well-appearing, in no acute distress; non-toxic CV: Regular Rate. Well-perfused. Warm.  Resp: Breathing unlabored on room air; no wheezing. Psych: Fluid speech in conversation; appropriate affect; normal thought process Neuro: Sensation intact throughout. No gross coordination deficits.   Ortho Exam - Bilateral shoulders: + TTP near Codman's point of the left shoulder  greater than right.  She has full active and passive range of motion throughout.  Positive empty can test on the left as well as some mild pain with resisted external rotation.  She has 5/5 strength in all directions with rotator cuff testing.  Negative Hawkins impingement, negative O'Brien's, negative speeds testing.  - Left hip: There is some mild TTP over the posterior lateral aspect of the greater trochanter, although improved from previous examinations.  She has marked improvement in weakness with 5/5 strength in all directions, specifically with resisted hip abduction.  No internal or external mechanical blocks to range of motion.  Imaging: MR Pelvis w/o contrast CLINICAL DATA:  Pelvis pain, stress fracture suspected, neg xray LEFT HIP PAIN left buttock and hip pain for 4 years, piriformis and  EXAM: MRI PELVIS WITHOUT CONTRAST  TECHNIQUE: Multiplanar multisequence MR imaging of the pelvis was performed. No intravenous contrast was administered.  COMPARISON:  Pelvis radiograph 04/30/2020  FINDINGS: Urinary Tract:  No abnormality visualized.  Bowel:  Unremarkable visualized pelvic bowel loops.  Vascular/Lymphatic: No pathologically enlarged lymph nodes. No significant vascular abnormality seen.  Reproductive:  Unremarkable for age.  Small nabothian cysts noted.  Other:  None.  Musculoskeletal: There is no evidence of acute fracture. There is no significant marrow signal alteration. No avascular necrosis. The SI joints unremarkable. There are moderate findings of chronic osteitis pubis, no current marrow edema there is no significant joint effusion. Mild hip chondrosis bilaterally. There is anterior superior acetabular subchondral cyst formation bilaterally. No evidence of labral tear. Symmetric sacral nerve roots normal in signal and caliber. Symmetric sciatic nerves, normal in signal and caliber.  There is mild tendinosis and adjacent peritrochanteric edema of  the distal left gluteus minimus tendon. The gluteus medius is intact. Intact right gluteal tendons. The hip adductors are intact. There is mild tendinosis of the left proximal hamstrings. There is no intramuscular edema or muscle atrophy.  IMPRESSION: Mild tendinosis of the distal left gluteus minimus tendon with mild peritrochanteric edema.  Mild tendinosis of the left proximal hamstrings.  Mild bilateral hip osteoarthritis.  No acute osseous abnormality.  Electronically Signed   By: Caprice Renshaw M.D.   On: 07/22/2022 12:59    Past Medical/Family/Surgical/Social History: Medications & Allergies reviewed per EMR, new medications updated. Patient Active Problem List   Diagnosis Date Noted   Tendinopathy of gluteus medius 08/19/2022   Hypertension 12/20/2016   Other fatigue 12/20/2016   Precordial chest pain 12/20/2016   Seasonal allergies 12/28/2014   Anxiety and depression 12/28/2014   Past Medical History:  Diagnosis Date   Anxiety and depression    Benign gestational thrombocytopenia (HCC) 12/03/2017   Cancer (HCC) 03/2014   basal carcinoma   Gestational hypertension 11/2011   Nephrolithiasis    Obstetrical laceration - bilateral perilabial splays 12/03/2017   Seasonal allergies    Family History  Problem Relation Age of Onset   Arthritis Mother  Ankylosing spondylitis Mother    Osteoporosis Mother 53   Arthritis Father    Other Father        car accident   Anxiety disorder Brother    Hypertension Brother    Arthritis Maternal Grandmother        Rheumatoid Arthritis   Breast cancer Maternal Grandmother 68   Healthy Half-Brother    Healthy Half-Sister    Diabetes Other    Hypertension Other        grandparent   Stroke Other        grandparent   Past Surgical History:  Procedure Laterality Date   MOHS SURGERY  03/2014   BCC on nose.   Social History   Occupational History    Comment: prn   Occupation:    Tobacco Use   Smoking status: Never     Passive exposure: Never   Smokeless tobacco: Never   Tobacco comments:    Married, lives with spouse. 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

## 2022-10-16 NOTE — Progress Notes (Signed)
Pain has improved some Still having the lateral hip pain  She has other issues she wants to discuss today as well  Taking ibuprofen for pain; but would like meloxicam to take daily

## 2022-11-05 DIAGNOSIS — Z1329 Encounter for screening for other suspected endocrine disorder: Secondary | ICD-10-CM | POA: Diagnosis not present

## 2022-11-05 DIAGNOSIS — N951 Menopausal and female climacteric states: Secondary | ICD-10-CM | POA: Diagnosis not present

## 2022-11-10 DIAGNOSIS — R6882 Decreased libido: Secondary | ICD-10-CM | POA: Diagnosis not present

## 2022-11-10 DIAGNOSIS — F419 Anxiety disorder, unspecified: Secondary | ICD-10-CM | POA: Diagnosis not present

## 2022-11-10 DIAGNOSIS — N951 Menopausal and female climacteric states: Secondary | ICD-10-CM | POA: Diagnosis not present

## 2022-11-10 DIAGNOSIS — Z6831 Body mass index (BMI) 31.0-31.9, adult: Secondary | ICD-10-CM | POA: Diagnosis not present

## 2022-12-29 ENCOUNTER — Encounter: Payer: Self-pay | Admitting: Family Medicine

## 2022-12-29 DIAGNOSIS — F419 Anxiety disorder, unspecified: Secondary | ICD-10-CM

## 2022-12-29 MED ORDER — VENLAFAXINE HCL ER 37.5 MG PO CP24: 37.5000 mg | ORAL_CAPSULE | Freq: Every day | ORAL | 0 refills | Status: DC

## 2023-01-05 DIAGNOSIS — N951 Menopausal and female climacteric states: Secondary | ICD-10-CM | POA: Diagnosis not present

## 2023-01-12 DIAGNOSIS — Z6831 Body mass index (BMI) 31.0-31.9, adult: Secondary | ICD-10-CM | POA: Diagnosis not present

## 2023-01-12 DIAGNOSIS — R6882 Decreased libido: Secondary | ICD-10-CM | POA: Diagnosis not present

## 2023-01-12 DIAGNOSIS — F419 Anxiety disorder, unspecified: Secondary | ICD-10-CM | POA: Diagnosis not present

## 2023-01-12 DIAGNOSIS — N951 Menopausal and female climacteric states: Secondary | ICD-10-CM | POA: Diagnosis not present

## 2023-01-29 NOTE — Progress Notes (Signed)
Office Visit Note  Patient: Alison [name]             Date of Birth: [birth year]           MRN: [id number]             PCP: Karie Georges, MD Referring: Karie Georges, MD Visit Date: 02/11/2023 Occupation: @GUAROCC @  Subjective:  Medication monitoring  History of Present Illness: Alison [name] is a 42 y.o. female returns today after her last visit in November 2023.  She states that the stretching and exercises helped her to some extent.  She felt best when she was on meloxicam on a regular basis.  Because of her elevated creatinine she has been taking meloxicam only occasionally and ibuprofen also on as needed basis.  She continues to have some discomfort over bilateral trochanteric region.  She also complains of some discomfort in her pubic and region and hip joints.  She has intermittent discomfort in the lower part of her sternum.  She has not noticed any inflammation or swelling in her hands.  She has intermittent lower back pain.  Shoulder joint pain has improved.  Patient states that she is tapering Effexor due to elevated blood pressure.    Activities of Daily Living:  Patient reports morning stiffness for 30-60 minutes.   Patient Denies nocturnal pain.  Difficulty dressing/grooming: Denies Difficulty climbing stairs: Denies Difficulty getting out of chair: Denies Difficulty using hands for taps, buttons, cutlery, and/or writing: Denies  Review of Systems  Constitutional:  Positive for fatigue.  HENT:  Positive for mouth sores. Negative for mouth dryness.   Eyes:  Positive for dryness.  Respiratory:  Negative for shortness of breath.   Cardiovascular:  Negative for chest pain and palpitations.  Gastrointestinal:  Negative for blood in stool, constipation and diarrhea.  Endocrine: Negative for increased urination.  Genitourinary:  Negative for involuntary urination.  Musculoskeletal:  Positive for joint pain, joint pain, joint swelling and morning stiffness.  Negative for gait problem, myalgias, muscle weakness, muscle tenderness and myalgias.  Skin:  Negative for color change, rash, hair loss and sensitivity to sunlight.  Allergic/Immunologic: Negative for susceptible to infections.  Neurological:  Negative for dizziness and headaches.  Hematological:  Negative for swollen glands.  Psychiatric/Behavioral:  Negative for depressed mood and sleep disturbance. The patient is not nervous/anxious.     PMFS History:  Patient Active Problem List   Diagnosis Date Noted   Tendinopathy of gluteus medius 08/19/2022   Hypertension 12/20/2016   Other fatigue 12/20/2016   Precordial chest pain 12/20/2016   Seasonal allergies 12/28/2014   Anxiety and depression 12/28/2014    Past Medical History:  Diagnosis Date   Anxiety and depression    Benign gestational thrombocytopenia (HCC) 12/03/2017   Cancer (HCC) 03/2014   basal carcinoma   Gestational hypertension 11/2011   Nephrolithiasis    Obstetrical laceration - bilateral perilabial splays 12/03/2017   Seasonal allergies     Family History  Problem Relation Age of Onset   Arthritis Mother    Ankylosing spondylitis Mother    Osteoporosis Mother 77   Arthritis Father    Other Father        car accident   Anxiety disorder Brother    Hypertension Brother    Arthritis Maternal Grandmother        Rheumatoid Arthritis   Breast cancer Maternal Grandmother 89   Healthy Half-Brother    Healthy Half-Sister    Diabetes Other  Hypertension Other        grandparent   Stroke Other        grandparent   Past Surgical History:  Procedure Laterality Date   MOHS SURGERY  03/2014   BCC on nose.   Social History   Social History Narrative   Work or School: works part time as Veterinary surgeon - for th [employer] Situation: lives with son (5 yo) and husband      Spiritual Beliefs: Christian      Lifestyle: trying to get exercise; diet is fair      Immunization History  Administered Date(s) Administered    Influenza Whole 06/28/2009   Influenza-Unspecified 06/28/2016, 06/29/2019, 06/28/2021   Moderna Sars-Covid-2 Vaccination 09/28/2019, 10/26/2019, 10/03/2020   Td 09/29/2007   Tdap 12/31/2010, 09/28/2017     Objective: Vital Signs: BP (!) 150/91 (BP Location: Left Arm, Patient Position: Sitting, Cuff Size: Normal)   Pulse 99   Resp 16   Ht 5\' 2"  (1.575 m)   Wt 171 lb 6.4 oz (77.7 kg)   BMI 31.35 kg/m    Physical Exam Vitals and nursing note reviewed.  Constitutional:      Appearance: She is well-developed.  HENT:     Head: Normocephalic and atraumatic.  Eyes:     Conjunctiva/sclera: Conjunctivae normal.  Cardiovascular:     Rate and Rhythm: Normal rate and regular rhythm.     Heart sounds: Normal heart sounds.  Pulmonary:     Effort: Pulmonary effort is normal.     Breath sounds: Normal breath sounds.  Abdominal:     General: Bowel sounds are normal.     Palpations: Abdomen is soft.  Musculoskeletal:     Cervical back: Normal range of motion.  Lymphadenopathy:     Cervical: No cervical adenopathy.  Skin:    General: Skin is warm and dry.     Capillary Refill: Capillary refill takes less than 2 seconds.  Neurological:     Mental Status: She is alert and oriented to person, place, and time.  Psychiatric:        Behavior: Behavior normal.      Musculoskeletal Exam: Cervical, thoracic and lumbar spine were in good range of motion.  She had no SI joint tenderness.  Shoulder joints, elbow joints, wrist joints, MCPs PIPs and DIPs were in good range of motion.  She had hypermobility in her bilateral elbow joints.  Hip joints were in good range of motion.  She had tenderness over bilateral trochanteric bursa.  Knee joints were in good range of motion without any warmth swelling or effusion.  There was no tenderness over ankles or MTPs.  There was no plantar fasciitis or Achilles tendinitis.  CDAI Exam: CDAI Score: -- Patient Global: --; Provider Global: -- Swollen: --;  Tender: -- Joint Exam 02/11/2023   No joint exam has been documented for this visit   There is currently no information documented on the homunculus. Go to the Rheumatology activity and complete the homunculus joint exam.  Investigation: No additional findings.  Imaging: No results found.  Recent Labs: Lab Results  Component Value Date   WBC 7.1 12/05/2021   HGB 13.5 12/05/2021   PLT 223 12/05/2021   NA 139 06/05/2022   K 3.7 06/05/2022   CL 103 06/05/2022   CO2 30 06/05/2022   GLUCOSE 88 06/05/2022   BUN 17 06/05/2022   CREATININE 0.94 06/05/2022   BILITOT 0.4 12/05/2021   ALKPHOS 119 12/03/2017  AST 20 12/05/2021   ALT 19 12/05/2021   PROT 7.0 12/05/2021   ALBUMIN 3.1 (L) 12/03/2017   CALCIUM 9.3 06/05/2022   GFRAA >60 12/03/2017    Speciality Comments: No specialty comments available.  Procedures:  No procedures performed Allergies: Patient has no known allergies.   Assessment / Plan:     Visit Diagnoses: HLA B27 positive - Family history of HLA-B27.  History of multiple arthralgias.  X-rays of SI joints were unremarkable in the past.  Recent MRI also showed SI joints were normal.  She denies history of SI joint pain, Planter fasciitis, Achilles tendinitis or uveitis.  She has been experiencing some discomfort in the trochanteric region bilaterally, lower part of the sternum and lower back.  She had benefit from physical therapy.  Chronic SI joint pain - Chronic SI joint dysfunction.  X-rays were unremarkable in the past.  She responds to cortisone injections.  MRI July 22, 2022-tendinosis distal left gluteus.  She had no tenderness on the examination today.  Trochanteric bursitis of both hips-she had good response to physical therapy.  She continues to have some discomfort in the trochanteric region.  IT band stretches were discussed.  Primary osteoarthritis of both hips -she had mild osteoarthritis in bilateral hip joints.  She had good range of motion  without discomfort.  Plan: Sedimentation rate, Rheumatoid factor, Cyclic citrul peptide antibody, IgG  Arthropathy of lumbar facet joint - History of chronic back pain.  Core strengthening exercises and weight loss was discussed.  Chronic right shoulder pain-she is current not having shoulder discomfort.  Primary hypertension-blood pressure was elevated today at 150/91.  Repeat blood pressure was 138/88.  She was advised to monitor blood pressure closely.  Other fatigue  Seasonal allergies  Anxiety and depression  Medication monitoring encounter -she is taking long-term NSAID's intermittently.  Plan: CBC with Differential/Platelet, COMPLETE METABOLIC PANEL WITH GFR  Orders: Orders Placed This Encounter  Procedures   CBC with Differential/Platelet   COMPLETE METABOLIC PANEL WITH GFR   Sedimentation rate   Rheumatoid factor   Cyclic citrul peptide antibody, IgG   No orders of the defined types were placed in this encounter.    Follow-Up Instructions: Return in about 6 months (around 08/14/2023) for Osteoarthritis.   Pollyann Savoy, MD  Note - This record has been created using Animal nutritionist.  Chart creation errors have been sought, but may not always  have been located. Such creation errors do not reflect on  the standard of medical care.

## 2023-02-05 DIAGNOSIS — J3081 Allergic rhinitis due to animal (cat) (dog) hair and dander: Secondary | ICD-10-CM | POA: Diagnosis not present

## 2023-02-05 DIAGNOSIS — J309 Allergic rhinitis, unspecified: Secondary | ICD-10-CM | POA: Diagnosis not present

## 2023-02-05 DIAGNOSIS — H1045 Other chronic allergic conjunctivitis: Secondary | ICD-10-CM | POA: Diagnosis not present

## 2023-02-05 DIAGNOSIS — J301 Allergic rhinitis due to pollen: Secondary | ICD-10-CM | POA: Diagnosis not present

## 2023-02-11 ENCOUNTER — Encounter: Payer: Self-pay | Admitting: Rheumatology

## 2023-02-11 ENCOUNTER — Ambulatory Visit: Payer: BC Managed Care – PPO | Attending: Rheumatology | Admitting: Rheumatology

## 2023-02-11 VITALS — BP 138/88 | HR 89 | Resp 16 | Ht 62.0 in | Wt 171.4 lb

## 2023-02-11 DIAGNOSIS — J301 Allergic rhinitis due to pollen: Secondary | ICD-10-CM | POA: Diagnosis not present

## 2023-02-11 DIAGNOSIS — M47816 Spondylosis without myelopathy or radiculopathy, lumbar region: Secondary | ICD-10-CM

## 2023-02-11 DIAGNOSIS — I10 Essential (primary) hypertension: Secondary | ICD-10-CM

## 2023-02-11 DIAGNOSIS — R5383 Other fatigue: Secondary | ICD-10-CM

## 2023-02-11 DIAGNOSIS — M7061 Trochanteric bursitis, right hip: Secondary | ICD-10-CM | POA: Diagnosis not present

## 2023-02-11 DIAGNOSIS — M160 Bilateral primary osteoarthritis of hip: Secondary | ICD-10-CM

## 2023-02-11 DIAGNOSIS — G8929 Other chronic pain: Secondary | ICD-10-CM

## 2023-02-11 DIAGNOSIS — J3081 Allergic rhinitis due to animal (cat) (dog) hair and dander: Secondary | ICD-10-CM | POA: Diagnosis not present

## 2023-02-11 DIAGNOSIS — F32A Depression, unspecified: Secondary | ICD-10-CM

## 2023-02-11 DIAGNOSIS — M533 Sacrococcygeal disorders, not elsewhere classified: Secondary | ICD-10-CM | POA: Diagnosis not present

## 2023-02-11 DIAGNOSIS — M25511 Pain in right shoulder: Secondary | ICD-10-CM

## 2023-02-11 DIAGNOSIS — Z5181 Encounter for therapeutic drug level monitoring: Secondary | ICD-10-CM

## 2023-02-11 DIAGNOSIS — M7062 Trochanteric bursitis, left hip: Secondary | ICD-10-CM | POA: Diagnosis not present

## 2023-02-11 DIAGNOSIS — Z1589 Genetic susceptibility to other disease: Secondary | ICD-10-CM | POA: Diagnosis not present

## 2023-02-11 DIAGNOSIS — F419 Anxiety disorder, unspecified: Secondary | ICD-10-CM

## 2023-02-11 DIAGNOSIS — J302 Other seasonal allergic rhinitis: Secondary | ICD-10-CM

## 2023-02-12 DIAGNOSIS — J3089 Other allergic rhinitis: Secondary | ICD-10-CM | POA: Diagnosis not present

## 2023-02-13 LAB — COMPLETE METABOLIC PANEL WITH GFR
AG Ratio: 1.8 (calc) (ref 1.0–2.5)
ALT: 17 U/L (ref 6–29)
AST: 18 U/L (ref 10–30)
Albumin: 4.4 g/dL (ref 3.6–5.1)
Alkaline phosphatase (APISO): 84 U/L (ref 31–125)
BUN/Creatinine Ratio: 18 (calc) (ref 6–22)
BUN: 19 mg/dL (ref 7–25)
CO2: 28 mmol/L (ref 20–32)
Calcium: 9.6 mg/dL (ref 8.6–10.2)
Chloride: 103 mmol/L (ref 98–110)
Creat: 1.03 mg/dL — ABNORMAL HIGH (ref 0.50–0.99)
Globulin: 2.5 g/dL (calc) (ref 1.9–3.7)
Glucose, Bld: 133 mg/dL — ABNORMAL HIGH (ref 65–99)
Potassium: 3.7 mmol/L (ref 3.5–5.3)
Sodium: 139 mmol/L (ref 135–146)
Total Bilirubin: 0.3 mg/dL (ref 0.2–1.2)
Total Protein: 6.9 g/dL (ref 6.1–8.1)
eGFR: 70 mL/min/{1.73_m2} (ref >=60)

## 2023-02-13 LAB — CBC WITH DIFFERENTIAL/PLATELET
Absolute Monocytes: 442 cells/uL (ref 200–950)
Basophils Absolute: 52 cells/uL (ref 0–200)
Basophils Relative: 0.8 %
Eosinophils Absolute: 163 cells/uL (ref 15–500)
Eosinophils Relative: 2.5 %
HCT: 40 % (ref 35.0–45.0)
Hemoglobin: 13.2 g/dL (ref 11.7–15.5)
Lymphs Abs: 1950 cells/uL (ref 850–3900)
MCH: 27.6 pg (ref 27.0–33.0)
MCHC: 33 g/dL (ref 32.0–36.0)
MCV: 83.5 fL (ref 80.0–100.0)
MPV: 10.3 fL (ref 7.5–12.5)
Monocytes Relative: 6.8 %
Neutro Abs: 3894 cells/uL (ref 1500–7800)
Neutrophils Relative %: 59.9 %
Platelets: 230 10*3/uL (ref 140–400)
RBC: 4.79 10*6/uL (ref 3.80–5.10)
RDW: 14 % (ref 11.0–15.0)
Total Lymphocyte: 30 %
WBC: 6.5 10*3/uL (ref 3.8–10.8)

## 2023-02-13 LAB — RHEUMATOID FACTOR: Rheumatoid fact SerPl-aCnc: <10 IU/mL (ref <14)

## 2023-02-13 LAB — CYCLIC CITRUL PEPTIDE ANTIBODY, IGG: Cyclic Citrullin Peptide Ab: <16 UNITS

## 2023-02-13 LAB — SEDIMENTATION RATE: Sed Rate: 9 mm/h (ref 0–20)

## 2023-02-14 NOTE — Progress Notes (Signed)
Creatinine is mildly elevated most likely due to meloxicam.  Patient should avoid use of all NSAIDs including meloxicam.  Glucose is mildly elevated probably not a fasting sample.  Tests for rheumatoid arthritis , both rheumatoid factor and anti-CCP are negative.  CBC was normal.  Sed rate is normal.  Labs do not indicate inflammation.

## 2023-02-26 DIAGNOSIS — J301 Allergic rhinitis due to pollen: Secondary | ICD-10-CM | POA: Diagnosis not present

## 2023-02-26 DIAGNOSIS — J3081 Allergic rhinitis due to animal (cat) (dog) hair and dander: Secondary | ICD-10-CM | POA: Diagnosis not present

## 2023-02-26 DIAGNOSIS — J3089 Other allergic rhinitis: Secondary | ICD-10-CM | POA: Diagnosis not present

## 2023-03-04 DIAGNOSIS — J301 Allergic rhinitis due to pollen: Secondary | ICD-10-CM | POA: Diagnosis not present

## 2023-03-04 DIAGNOSIS — J3089 Other allergic rhinitis: Secondary | ICD-10-CM | POA: Diagnosis not present

## 2023-03-04 DIAGNOSIS — J3081 Allergic rhinitis due to animal (cat) (dog) hair and dander: Secondary | ICD-10-CM | POA: Diagnosis not present

## 2023-03-05 ENCOUNTER — Other Ambulatory Visit: Payer: Self-pay | Admitting: Family Medicine

## 2023-03-05 DIAGNOSIS — F419 Anxiety disorder, unspecified: Secondary | ICD-10-CM

## 2023-03-05 DIAGNOSIS — I10 Essential (primary) hypertension: Secondary | ICD-10-CM

## 2023-03-23 DIAGNOSIS — J301 Allergic rhinitis due to pollen: Secondary | ICD-10-CM | POA: Diagnosis not present

## 2023-03-23 DIAGNOSIS — J3081 Allergic rhinitis due to animal (cat) (dog) hair and dander: Secondary | ICD-10-CM | POA: Diagnosis not present

## 2023-03-23 DIAGNOSIS — J3089 Other allergic rhinitis: Secondary | ICD-10-CM | POA: Diagnosis not present

## 2023-03-25 DIAGNOSIS — J3081 Allergic rhinitis due to animal (cat) (dog) hair and dander: Secondary | ICD-10-CM | POA: Diagnosis not present

## 2023-03-25 DIAGNOSIS — J301 Allergic rhinitis due to pollen: Secondary | ICD-10-CM | POA: Diagnosis not present

## 2023-03-25 DIAGNOSIS — J3089 Other allergic rhinitis: Secondary | ICD-10-CM | POA: Diagnosis not present

## 2023-03-31 DIAGNOSIS — J3081 Allergic rhinitis due to animal (cat) (dog) hair and dander: Secondary | ICD-10-CM | POA: Diagnosis not present

## 2023-03-31 DIAGNOSIS — J301 Allergic rhinitis due to pollen: Secondary | ICD-10-CM | POA: Diagnosis not present

## 2023-03-31 DIAGNOSIS — J3089 Other allergic rhinitis: Secondary | ICD-10-CM | POA: Diagnosis not present

## 2023-04-06 ENCOUNTER — Other Ambulatory Visit: Payer: Self-pay | Admitting: Family Medicine

## 2023-04-06 DIAGNOSIS — F419 Anxiety disorder, unspecified: Secondary | ICD-10-CM

## 2023-04-08 DIAGNOSIS — J3081 Allergic rhinitis due to animal (cat) (dog) hair and dander: Secondary | ICD-10-CM | POA: Diagnosis not present

## 2023-04-08 DIAGNOSIS — J301 Allergic rhinitis due to pollen: Secondary | ICD-10-CM | POA: Diagnosis not present

## 2023-04-08 DIAGNOSIS — J3089 Other allergic rhinitis: Secondary | ICD-10-CM | POA: Diagnosis not present

## 2023-04-13 DIAGNOSIS — J3081 Allergic rhinitis due to animal (cat) (dog) hair and dander: Secondary | ICD-10-CM | POA: Diagnosis not present

## 2023-04-13 DIAGNOSIS — J3089 Other allergic rhinitis: Secondary | ICD-10-CM | POA: Diagnosis not present

## 2023-04-13 DIAGNOSIS — J301 Allergic rhinitis due to pollen: Secondary | ICD-10-CM | POA: Diagnosis not present

## 2023-04-20 DIAGNOSIS — J301 Allergic rhinitis due to pollen: Secondary | ICD-10-CM | POA: Diagnosis not present

## 2023-04-20 DIAGNOSIS — J3081 Allergic rhinitis due to animal (cat) (dog) hair and dander: Secondary | ICD-10-CM | POA: Diagnosis not present

## 2023-04-20 DIAGNOSIS — J3089 Other allergic rhinitis: Secondary | ICD-10-CM | POA: Diagnosis not present

## 2023-05-05 DIAGNOSIS — J3081 Allergic rhinitis due to animal (cat) (dog) hair and dander: Secondary | ICD-10-CM | POA: Diagnosis not present

## 2023-05-05 DIAGNOSIS — J3089 Other allergic rhinitis: Secondary | ICD-10-CM | POA: Diagnosis not present

## 2023-05-05 DIAGNOSIS — J301 Allergic rhinitis due to pollen: Secondary | ICD-10-CM | POA: Diagnosis not present

## 2023-05-17 ENCOUNTER — Encounter: Payer: Self-pay | Admitting: Family Medicine

## 2023-05-17 ENCOUNTER — Ambulatory Visit: Payer: BC Managed Care – PPO | Admitting: Family Medicine

## 2023-05-17 VITALS — BP 110/60 | HR 88 | Temp 98.3°F | Ht 62.0 in | Wt 152.7 lb

## 2023-05-17 DIAGNOSIS — J029 Acute pharyngitis, unspecified: Secondary | ICD-10-CM | POA: Diagnosis not present

## 2023-05-17 LAB — POC COVID19 BINAXNOW: SARS Coronavirus 2 Ag: NEGATIVE

## 2023-05-17 LAB — POCT RAPID STREP A (OFFICE): Rapid Strep A Screen: NEGATIVE

## 2023-05-17 MED ORDER — AMOXICILLIN 500 MG PO CAPS: 500.0000 mg | ORAL_CAPSULE | Freq: Two times a day (BID) | ORAL | 0 refills | Status: AC

## 2023-05-17 NOTE — Progress Notes (Signed)
Established Patient Office Visit  Subjective   Patient ID: Alison [name], female    DOB: [birth year]  Age: 42 y.o. MRN: [id number]  No chief complaint on file.   HPI   Alison [name] is seen with 1 day history of fever, chills, headache and mild sore throat.  Fever was around 101 at all last night.  Home COVID test negative.  Denies any nasal congestion or cough.  No vomiting or diarrhea.  History also significant for the fact that she has 2 children that were diagnosed recently with strep.  They are gradually improving.  Alison [name] has no rash.  No recent tick bites.  Past Medical History:  Diagnosis Date   Anxiety and depression    Benign gestational thrombocytopenia (HCC) 12/03/2017   Cancer (HCC) 03/2014   basal carcinoma   Gestational hypertension 11/2011   Nephrolithiasis    Obstetrical laceration - bilateral perilabial splays 12/03/2017   Seasonal allergies    Past Surgical History:  Procedure Laterality Date   MOHS SURGERY  03/2014   BCC on nose.    reports that she has never smoked. She has never been exposed to tobacco smoke. She has never used smokeless tobacco. She reports current alcohol use. She reports that she does not use drugs. family history includes Ankylosing spondylitis in her mother; Anxiety disorder in her brother; Arthritis in her father, maternal grandmother, and mother; Breast cancer (age of onset: 61) in her maternal grandmother; Diabetes in an other family member; Healthy in her half-brother and half-sister; Hypertension in her brother and another family member; Osteoporosis (age of onset: 20) in her mother; Other in her father; Stroke in an other family member. No Known Allergies  Review of Systems  Constitutional:  Positive for chills and fever.  HENT:  Positive for sore throat. Negative for congestion.   Respiratory:  Negative for cough.   Musculoskeletal:  Positive for myalgias.  Skin:  Negative for rash.  Neurological:  Positive for headaches.       Objective:     BP 110/60 (BP Location: Left Arm, Patient Position: Sitting, Cuff Size: Normal)   Pulse 88   Temp 98.3 F (36.8 C) (Oral)   Ht 5\' 2"  (1.575 m)   Wt 152 lb 11.2 oz (69.3 kg)   SpO2 98%   BMI 27.93 kg/m  BP Readings from Last 3 Encounters:  05/17/23 110/60  02/11/23 138/88  09/29/22 124/84   Wt Readings from Last 3 Encounters:  05/17/23 152 lb 11.2 oz (69.3 kg)  02/11/23 171 lb 6.4 oz (77.7 kg)  09/29/22 174 lb 8 oz (79.2 kg)      Physical Exam Vitals reviewed.  Constitutional:      General: She is not in acute distress.    Appearance: Normal appearance. She is not ill-appearing.  HENT:     Ears:     Comments: She has some cerumen bilaterally which obscures TMs    Mouth/Throat:     Comments: Mild posterior pharynx erythema.  No exudate. Cardiovascular:     Rate and Rhythm: Normal rate and regular rhythm.  Pulmonary:     Effort: Pulmonary effort is normal.     Breath sounds: Normal breath sounds.  Musculoskeletal:     Cervical back: Neck supple.  Lymphadenopathy:     Cervical: Cervical adenopathy present.  Neurological:     Mental Status: She is alert.      No results found for any visits on 05/17/23.    The 10-year ASCVD risk  score (Arnett DK, et al., 2019) is: 0.7%    Assessment & Plan:   #1 day history of fever, chills, headache, sore throat.  Even though her rapid strep was negative both of her children have strep throat and she has fairly classic symptoms.  Also, she lacks any nasal congestion or cough typical of other viral syndromes and COVID test at home and here are negative. -We discussed pros and cons of throat culture -We elected to go and cover her with amoxicillin 500 mg twice daily for 10 days -Continue Tylenol or ibuprofen as needed for discomfort -Follow-up for any persistent or worsening symptoms  Evelena Peat, MD

## 2023-05-17 NOTE — Addendum Note (Signed)
Addended by: Kathreen Devoid on: 05/17/2023 02:40 PM   Modules accepted: Orders

## 2023-05-18 DIAGNOSIS — J3081 Allergic rhinitis due to animal (cat) (dog) hair and dander: Secondary | ICD-10-CM | POA: Diagnosis not present

## 2023-05-18 DIAGNOSIS — J301 Allergic rhinitis due to pollen: Secondary | ICD-10-CM | POA: Diagnosis not present

## 2023-05-18 DIAGNOSIS — J3089 Other allergic rhinitis: Secondary | ICD-10-CM | POA: Diagnosis not present

## 2023-05-21 DIAGNOSIS — J3081 Allergic rhinitis due to animal (cat) (dog) hair and dander: Secondary | ICD-10-CM | POA: Diagnosis not present

## 2023-05-21 DIAGNOSIS — J301 Allergic rhinitis due to pollen: Secondary | ICD-10-CM | POA: Diagnosis not present

## 2023-05-21 DIAGNOSIS — J3089 Other allergic rhinitis: Secondary | ICD-10-CM | POA: Diagnosis not present

## 2023-05-27 DIAGNOSIS — J3089 Other allergic rhinitis: Secondary | ICD-10-CM | POA: Diagnosis not present

## 2023-05-27 DIAGNOSIS — J301 Allergic rhinitis due to pollen: Secondary | ICD-10-CM | POA: Diagnosis not present

## 2023-05-27 DIAGNOSIS — J3081 Allergic rhinitis due to animal (cat) (dog) hair and dander: Secondary | ICD-10-CM | POA: Diagnosis not present

## 2023-06-02 ENCOUNTER — Other Ambulatory Visit: Payer: Self-pay | Admitting: Family Medicine

## 2023-06-02 DIAGNOSIS — F419 Anxiety disorder, unspecified: Secondary | ICD-10-CM

## 2023-06-02 DIAGNOSIS — I10 Essential (primary) hypertension: Secondary | ICD-10-CM

## 2023-06-03 DIAGNOSIS — J301 Allergic rhinitis due to pollen: Secondary | ICD-10-CM | POA: Diagnosis not present

## 2023-06-03 DIAGNOSIS — J3081 Allergic rhinitis due to animal (cat) (dog) hair and dander: Secondary | ICD-10-CM | POA: Diagnosis not present

## 2023-06-03 DIAGNOSIS — J3089 Other allergic rhinitis: Secondary | ICD-10-CM | POA: Diagnosis not present

## 2023-06-10 DIAGNOSIS — J3081 Allergic rhinitis due to animal (cat) (dog) hair and dander: Secondary | ICD-10-CM | POA: Diagnosis not present

## 2023-06-10 DIAGNOSIS — J301 Allergic rhinitis due to pollen: Secondary | ICD-10-CM | POA: Diagnosis not present

## 2023-06-10 DIAGNOSIS — J3089 Other allergic rhinitis: Secondary | ICD-10-CM | POA: Diagnosis not present

## 2023-06-17 DIAGNOSIS — J3081 Allergic rhinitis due to animal (cat) (dog) hair and dander: Secondary | ICD-10-CM | POA: Diagnosis not present

## 2023-06-17 DIAGNOSIS — J3089 Other allergic rhinitis: Secondary | ICD-10-CM | POA: Diagnosis not present

## 2023-06-17 DIAGNOSIS — J301 Allergic rhinitis due to pollen: Secondary | ICD-10-CM | POA: Diagnosis not present

## 2023-06-24 DIAGNOSIS — J301 Allergic rhinitis due to pollen: Secondary | ICD-10-CM | POA: Diagnosis not present

## 2023-06-24 DIAGNOSIS — J3089 Other allergic rhinitis: Secondary | ICD-10-CM | POA: Diagnosis not present

## 2023-06-24 DIAGNOSIS — J3081 Allergic rhinitis due to animal (cat) (dog) hair and dander: Secondary | ICD-10-CM | POA: Diagnosis not present

## 2023-06-28 DIAGNOSIS — L570 Actinic keratosis: Secondary | ICD-10-CM | POA: Diagnosis not present

## 2023-06-28 DIAGNOSIS — Z85828 Personal history of other malignant neoplasm of skin: Secondary | ICD-10-CM | POA: Diagnosis not present

## 2023-06-28 DIAGNOSIS — L814 Other melanin hyperpigmentation: Secondary | ICD-10-CM | POA: Diagnosis not present

## 2023-06-29 DIAGNOSIS — Z3202 Encounter for pregnancy test, result negative: Secondary | ICD-10-CM | POA: Diagnosis not present

## 2023-06-29 DIAGNOSIS — N939 Abnormal uterine and vaginal bleeding, unspecified: Secondary | ICD-10-CM | POA: Diagnosis not present

## 2023-07-02 DIAGNOSIS — J3089 Other allergic rhinitis: Secondary | ICD-10-CM | POA: Diagnosis not present

## 2023-07-02 DIAGNOSIS — J301 Allergic rhinitis due to pollen: Secondary | ICD-10-CM | POA: Diagnosis not present

## 2023-07-02 DIAGNOSIS — J3081 Allergic rhinitis due to animal (cat) (dog) hair and dander: Secondary | ICD-10-CM | POA: Diagnosis not present

## 2023-07-05 ENCOUNTER — Other Ambulatory Visit: Payer: Self-pay | Admitting: Family Medicine

## 2023-07-05 DIAGNOSIS — F32A Depression, unspecified: Secondary | ICD-10-CM

## 2023-07-08 DIAGNOSIS — J301 Allergic rhinitis due to pollen: Secondary | ICD-10-CM | POA: Diagnosis not present

## 2023-07-08 DIAGNOSIS — J3081 Allergic rhinitis due to animal (cat) (dog) hair and dander: Secondary | ICD-10-CM | POA: Diagnosis not present

## 2023-07-08 DIAGNOSIS — J3089 Other allergic rhinitis: Secondary | ICD-10-CM | POA: Diagnosis not present

## 2023-07-09 ENCOUNTER — Other Ambulatory Visit: Payer: Self-pay | Admitting: Family Medicine

## 2023-07-09 DIAGNOSIS — F32A Depression, unspecified: Secondary | ICD-10-CM

## 2023-07-12 MED ORDER — VENLAFAXINE HCL ER 37.5 MG PO CP24
37.5000 mg | ORAL_CAPSULE | Freq: Every day | ORAL | 0 refills | Status: DC
Start: 2023-07-12 — End: 2023-08-12

## 2023-07-15 DIAGNOSIS — J301 Allergic rhinitis due to pollen: Secondary | ICD-10-CM | POA: Diagnosis not present

## 2023-07-15 DIAGNOSIS — J3089 Other allergic rhinitis: Secondary | ICD-10-CM | POA: Diagnosis not present

## 2023-07-15 DIAGNOSIS — J3081 Allergic rhinitis due to animal (cat) (dog) hair and dander: Secondary | ICD-10-CM | POA: Diagnosis not present

## 2023-07-20 ENCOUNTER — Ambulatory Visit: Payer: BC Managed Care – PPO | Admitting: Family Medicine

## 2023-07-20 ENCOUNTER — Encounter: Payer: Self-pay | Admitting: Family Medicine

## 2023-07-20 VITALS — BP 124/80 | HR 73 | Temp 98.1°F | Wt 145.6 lb

## 2023-07-20 DIAGNOSIS — J309 Allergic rhinitis, unspecified: Secondary | ICD-10-CM | POA: Diagnosis not present

## 2023-07-20 DIAGNOSIS — N289 Disorder of kidney and ureter, unspecified: Secondary | ICD-10-CM

## 2023-07-20 NOTE — Progress Notes (Signed)
Subjective:    Patient ID: Alison [name], female    DOB: 04-17-1981, 42 y.o.   MRN: [id number]  HPI Here for 2 issues. First she asks [place] to review recent lab work that may indicate a kidney problem. She had labs here on 02-11-23 showing a creatinine of 1.03 and a GFR of 70. Then on 07-16-23 she had labs at [facility] that showed a creatinine of 1.07 and a GFR of 66. She has been working with a [employer] program, and with diet and exercise and taking Ozempic she has lost 25 lbs in the past 5 months. She drinks lots of water every day and she has stopped taking any type of NSAID. The other issue is 2 months of daily burning sensations in the face below both eyes. No runny nose or ST. She takes Claritin daily and uses Flonase sprays daily.    Review of Systems  Constitutional: Negative.   HENT:  Positive for sinus pain. Negative for congestion, ear pain, postnasal drip and sore throat.   Eyes: Negative.   Respiratory: Negative.    Cardiovascular: Negative.   Gastrointestinal: Negative.   Genitourinary: Negative.        Objective:   Physical Exam Constitutional:      Appearance: Normal appearance.  HENT:     Right Ear: Tympanic membrane, ear canal and external ear normal.     Left Ear: Tympanic membrane, ear canal and external ear normal.     Nose: Nose normal.     Mouth/Throat:     Pharynx: Oropharynx is clear.  Eyes:     Conjunctiva/sclera: Conjunctivae normal.  Cardiovascular:     Rate and Rhythm: Normal rate and regular rhythm.     Pulses: Normal pulses.     Heart sounds: Normal heart sounds.  Pulmonary:     Effort: Pulmonary effort is normal.     Breath sounds: Normal breath sounds.  Musculoskeletal:     Right lower leg: No edema.     Left lower leg: No edema.  Lymphadenopathy:     Cervical: No cervical adenopathy.  Neurological:     Mental Status: She is alert.           Assessment & Plan:  I reassured that her that her renal function is only borderline  affected, and that there is nothing to worry about at this point. We will continue to monitor this, and she will have a BMET repeated in 3 months. As for the facial burning, I think this may be a side effect of the Flonase, so she will stop this and begin using saline nasal sprays instead. Follow up as needed.  Gershon Crane, MD

## 2023-07-22 DIAGNOSIS — J3089 Other allergic rhinitis: Secondary | ICD-10-CM | POA: Diagnosis not present

## 2023-07-22 DIAGNOSIS — J301 Allergic rhinitis due to pollen: Secondary | ICD-10-CM | POA: Diagnosis not present

## 2023-07-22 DIAGNOSIS — J3081 Allergic rhinitis due to animal (cat) (dog) hair and dander: Secondary | ICD-10-CM | POA: Diagnosis not present

## 2023-07-26 ENCOUNTER — Other Ambulatory Visit: Payer: Self-pay | Admitting: Family Medicine

## 2023-07-26 DIAGNOSIS — R7989 Other specified abnormal findings of blood chemistry: Secondary | ICD-10-CM

## 2023-07-27 ENCOUNTER — Ambulatory Visit
Admission: RE | Admit: 2023-07-27 | Discharge: 2023-07-27 | Disposition: A | Discharge status: Home / Self Care | Payer: BC Managed Care – PPO | Source: Ambulatory Visit | Attending: Family Medicine | Admitting: Family Medicine

## 2023-07-27 DIAGNOSIS — R7989 Other specified abnormal findings of blood chemistry: Secondary | ICD-10-CM | POA: Diagnosis not present

## 2023-07-29 DIAGNOSIS — J301 Allergic rhinitis due to pollen: Secondary | ICD-10-CM | POA: Diagnosis not present

## 2023-07-29 DIAGNOSIS — J3089 Other allergic rhinitis: Secondary | ICD-10-CM | POA: Diagnosis not present

## 2023-07-29 DIAGNOSIS — J3081 Allergic rhinitis due to animal (cat) (dog) hair and dander: Secondary | ICD-10-CM | POA: Diagnosis not present

## 2023-08-05 DIAGNOSIS — J3081 Allergic rhinitis due to animal (cat) (dog) hair and dander: Secondary | ICD-10-CM | POA: Diagnosis not present

## 2023-08-05 DIAGNOSIS — J301 Allergic rhinitis due to pollen: Secondary | ICD-10-CM | POA: Diagnosis not present

## 2023-08-05 DIAGNOSIS — J3089 Other allergic rhinitis: Secondary | ICD-10-CM | POA: Diagnosis not present

## 2023-08-06 NOTE — Progress Notes (Deleted)
Office Visit Note  Patient: Alison [name]             Date of Birth: December 12, 1980           MRN: [id number]             PCP: Karie Georges, MD Referring: Karie Georges, MD Visit Date: 08/19/2023 Occupation: @GUAROCC @  Subjective:  No chief complaint on file.   History of Present Illness: Alison [name] is a 42 y.o. female ***     Activities of Daily Living:  Patient reports morning stiffness for *** {minute/hour:19697}.   Patient {ACTIONS;DENIES/REPORTS:21021675::"Denies"} nocturnal pain.  Difficulty dressing/grooming: {ACTIONS;DENIES/REPORTS:21021675::"Denies"} Difficulty climbing stairs: {ACTIONS;DENIES/REPORTS:21021675::"Denies"} Difficulty getting out of chair: {ACTIONS;DENIES/REPORTS:21021675::"Denies"} Difficulty using hands for taps, buttons, cutlery, and/or writing: {ACTIONS;DENIES/REPORTS:21021675::"Denies"}  No Rheumatology ROS completed.   PMFS History:  Patient Active Problem List   Diagnosis Date Noted   Tendinopathy of gluteus medius 08/19/2022   Hypertension 12/20/2016   Other fatigue 12/20/2016   Precordial chest pain 12/20/2016   Seasonal allergies 12/28/2014   Anxiety and depression 12/28/2014    Past Medical History:  Diagnosis Date   Anxiety and depression    Benign gestational thrombocytopenia (HCC) 12/03/2017   Cancer (HCC) 03/2014   basal carcinoma   Gestational hypertension 11/2011   Nephrolithiasis    Obstetrical laceration - bilateral perilabial splays 12/03/2017   Seasonal allergies     Family History  Problem Relation Age of Onset   Arthritis Mother    Ankylosing spondylitis Mother    Osteoporosis Mother 61   Arthritis Father    Other Father        car accident   Anxiety disorder Brother    Hypertension Brother    Arthritis Maternal Grandmother        Rheumatoid Arthritis   Breast cancer Maternal Grandmother 9   Healthy Half-Brother    Healthy Half-Sister    Diabetes Other    Hypertension Other        grandparent    Stroke Other        grandparent   Past Surgical History:  Procedure Laterality Date   MOHS SURGERY  03/2014   BCC on nose.   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   Physical Exam   Musculoskeletal Exam: ***  CDAI Exam: CDAI Score: -- Patient Global: --; Provider Global: -- Swollen: --; Tender: -- Joint Exam 08/19/2023   No joint exam has been documented for this visit   There is currently no information documented on the homunculus. Go to the Rheumatology activity and complete the homunculus joint exam.  Investigation: No additional findings.  Imaging: US RENAL  Result Date: 07/27/2023 CLINICAL DATA:  Elevated serum creatinine EXAM: RENAL / URINARY TRACT ULTRASOUND COMPLETE COMPARISON:  None Available. FINDINGS: Right Kidney: Renal measurements: 10.9 x 5.8 x 4.9 cm = volume: 163.7 mL. Echogenicity within normal limits. No mass  or hydronephrosis visualized. Left Kidney: Renal measurements: 11.6 x 5.0 x 5.1 cm = volume: 153.5 mL. Echogenicity within normal limits. No mass or hydronephrosis visualized. Bladder: Appears normal for degree of bladder distention. Other: None. IMPRESSION: No hydronephrosis. Electronically Signed   By: Annia Belt M.D.   On: 07/27/2023 20:18    Recent Labs: Lab Results  Component Value Date   WBC 6.5 02/11/2023   HGB 13.2 02/11/2023   PLT 230 02/11/2023   NA 139 02/11/2023   K 3.7 02/11/2023   CL  103 02/11/2023   CO2 28 02/11/2023   GLUCOSE 133 (H) 02/11/2023   BUN 19 02/11/2023   CREATININE 1.03 (H) 02/11/2023   BILITOT 0.3 02/11/2023   ALKPHOS 119 12/03/2017   AST 18 02/11/2023   ALT 17 02/11/2023   PROT 6.9 02/11/2023   ALBUMIN 3.1 (L) 12/03/2017   CALCIUM 9.6 02/11/2023   GFRAA >60 12/03/2017    Speciality Comments: No specialty comments available.  Procedures:  No procedures performed Allergies: Patient has no known allergies.   Assessment / Plan:     Visit Diagnoses: No diagnosis found.  Orders: No orders of the defined types were placed in this encounter.  No orders of the defined types were placed in this encounter.   Face-to-face time spent with patient was *** minutes. Greater than 50% of time was spent in counseling and coordination of care.  Follow-Up Instructions: No follow-ups on file.   Ellen Henri, CMA  Note - This record has been created using Animal nutritionist.  Chart creation errors have been sought, but may not always  have been located. Such creation errors do not reflect on  the standard of medical care.

## 2023-08-12 ENCOUNTER — Other Ambulatory Visit: Payer: Self-pay | Admitting: Family Medicine

## 2023-08-12 DIAGNOSIS — J3089 Other allergic rhinitis: Secondary | ICD-10-CM | POA: Diagnosis not present

## 2023-08-12 DIAGNOSIS — J3081 Allergic rhinitis due to animal (cat) (dog) hair and dander: Secondary | ICD-10-CM | POA: Diagnosis not present

## 2023-08-12 DIAGNOSIS — F32A Depression, unspecified: Secondary | ICD-10-CM

## 2023-08-12 DIAGNOSIS — J301 Allergic rhinitis due to pollen: Secondary | ICD-10-CM | POA: Diagnosis not present

## 2023-08-12 MED ORDER — VENLAFAXINE HCL ER 37.5 MG PO CP24: 37.5000 mg | ORAL_CAPSULE | Freq: Every day | ORAL | 0 refills | Status: DC

## 2023-08-18 DIAGNOSIS — N920 Excessive and frequent menstruation with regular cycle: Secondary | ICD-10-CM | POA: Diagnosis not present

## 2023-08-19 ENCOUNTER — Ambulatory Visit: Payer: BC Managed Care – PPO | Admitting: Rheumatology

## 2023-08-19 DIAGNOSIS — Z1589 Genetic susceptibility to other disease: Secondary | ICD-10-CM

## 2023-08-19 DIAGNOSIS — R5383 Other fatigue: Secondary | ICD-10-CM

## 2023-08-19 DIAGNOSIS — M160 Bilateral primary osteoarthritis of hip: Secondary | ICD-10-CM

## 2023-08-19 DIAGNOSIS — G8929 Other chronic pain: Secondary | ICD-10-CM

## 2023-08-19 DIAGNOSIS — J3089 Other allergic rhinitis: Secondary | ICD-10-CM | POA: Diagnosis not present

## 2023-08-19 DIAGNOSIS — M7061 Trochanteric bursitis, right hip: Secondary | ICD-10-CM

## 2023-08-19 DIAGNOSIS — J3081 Allergic rhinitis due to animal (cat) (dog) hair and dander: Secondary | ICD-10-CM | POA: Diagnosis not present

## 2023-08-19 DIAGNOSIS — F419 Anxiety disorder, unspecified: Secondary | ICD-10-CM

## 2023-08-19 DIAGNOSIS — J302 Other seasonal allergic rhinitis: Secondary | ICD-10-CM

## 2023-08-19 DIAGNOSIS — M47816 Spondylosis without myelopathy or radiculopathy, lumbar region: Secondary | ICD-10-CM

## 2023-08-19 DIAGNOSIS — J301 Allergic rhinitis due to pollen: Secondary | ICD-10-CM | POA: Diagnosis not present

## 2023-08-19 DIAGNOSIS — I10 Essential (primary) hypertension: Secondary | ICD-10-CM

## 2023-08-26 IMAGING — DX DG CHEST 2V
2 series · 2 of 2 positions shown · non-contrast
Comparison: 12/04/2016

CLINICAL DATA: History of COVID 19 infection 1 month ago with
persistent cough, initial encounter

EXAM:
CHEST - 2 VIEW

[chest pa]
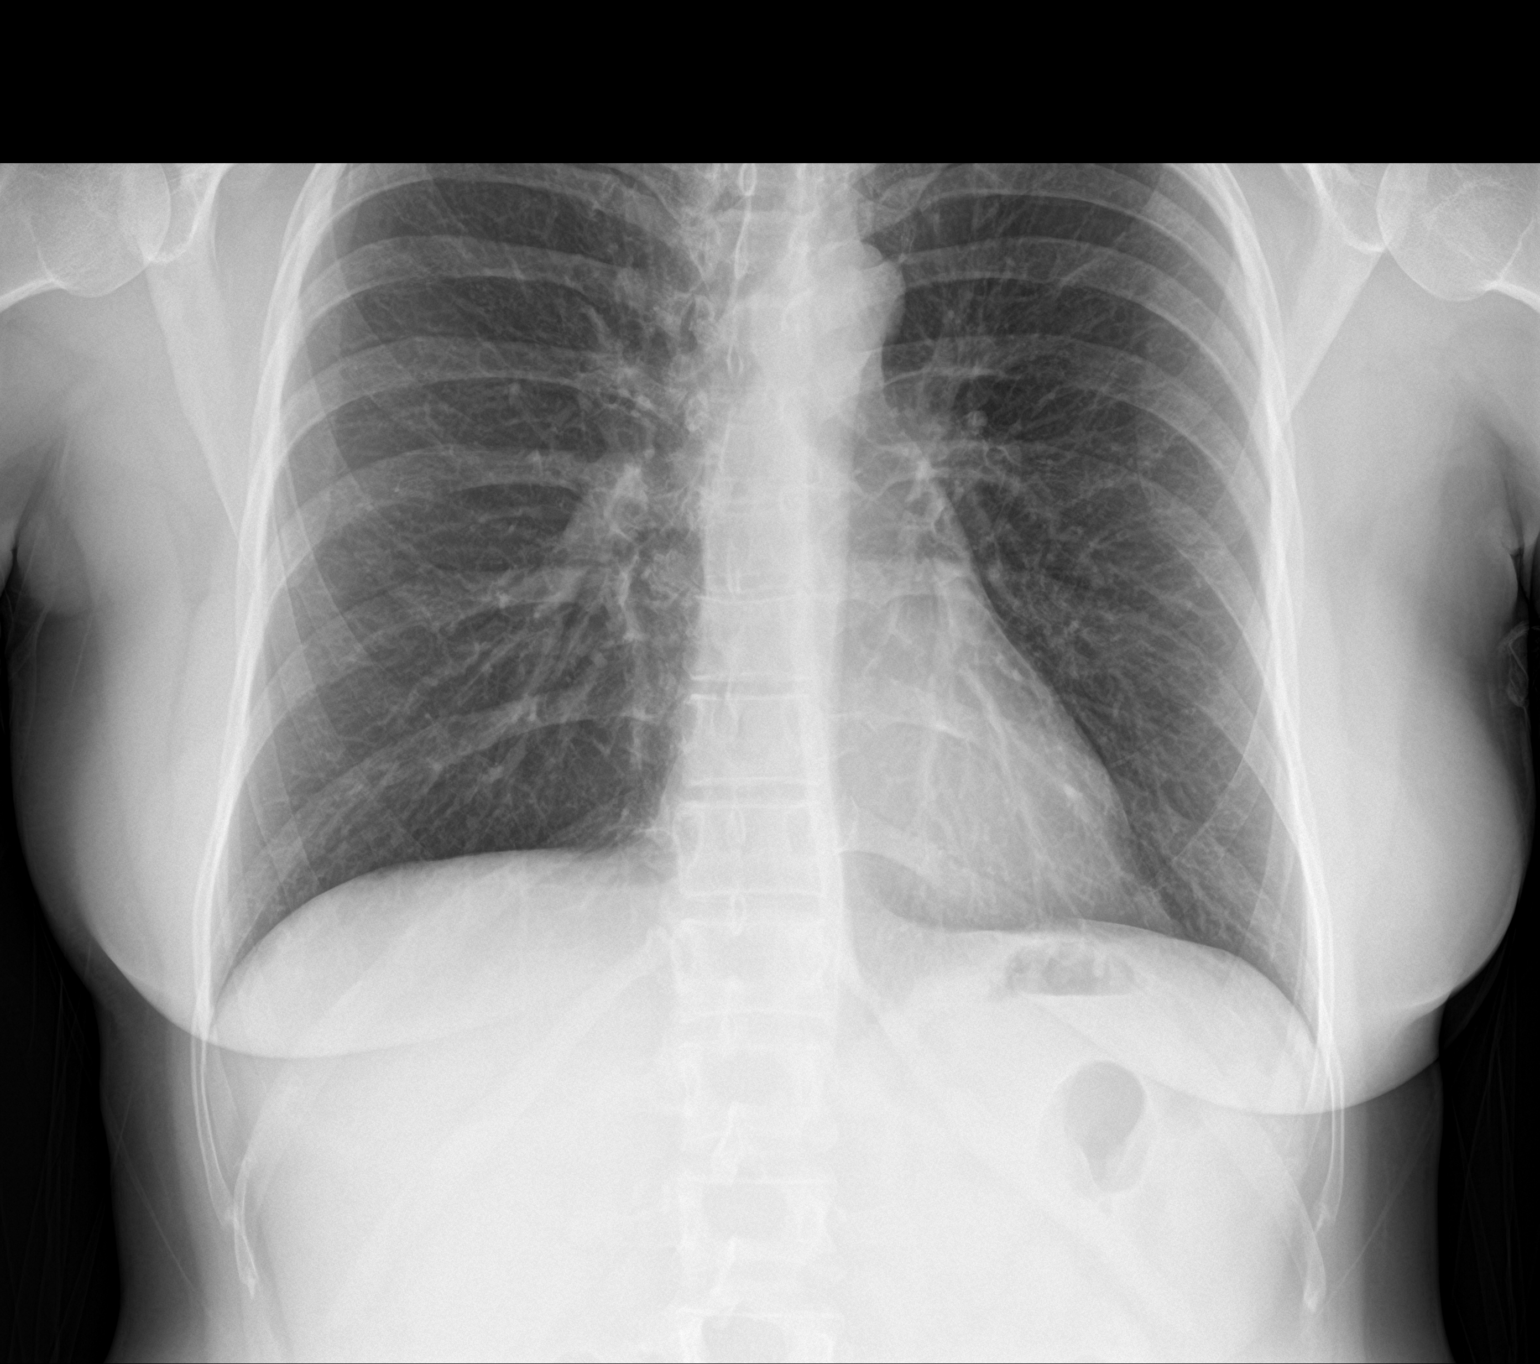

[chest lat]
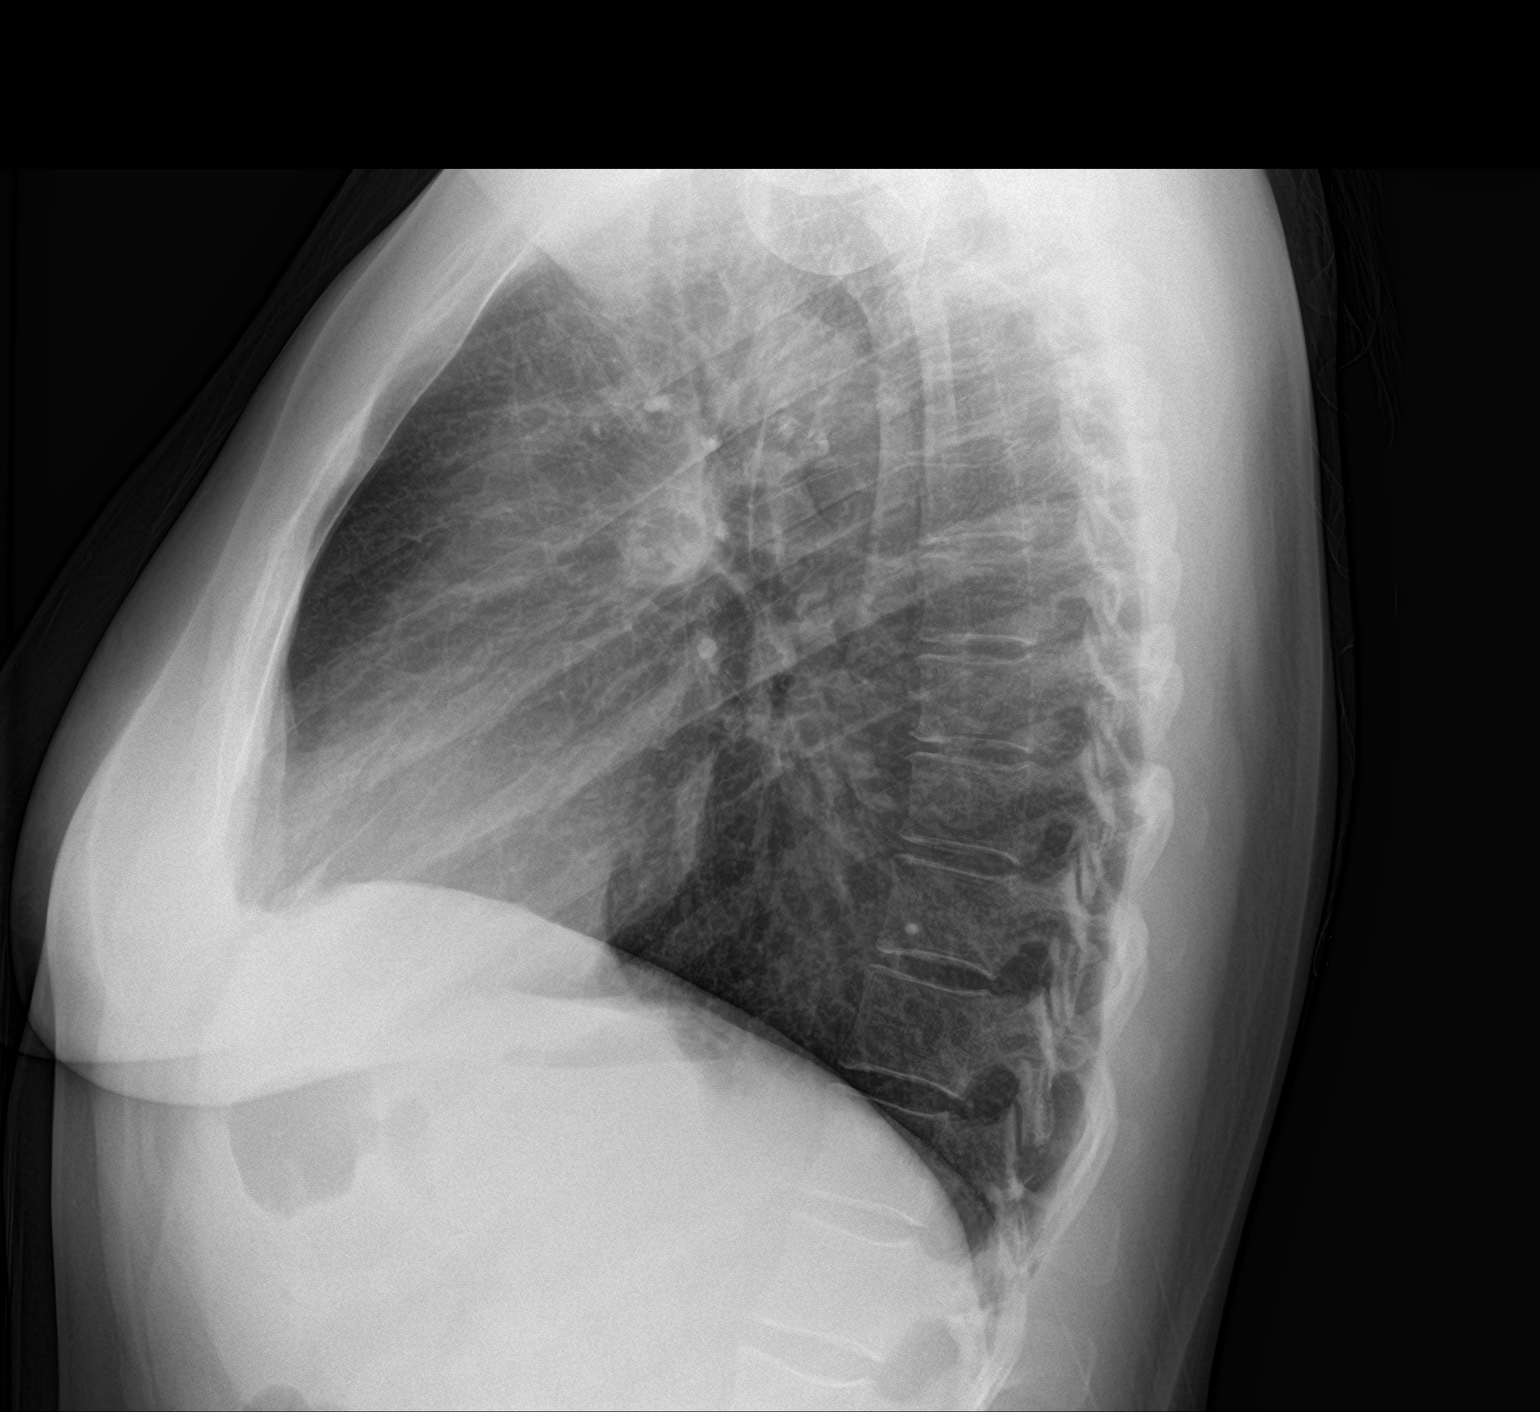

[2 of 2 positions shown; findings below may reference images not displayed]

FINDINGS: The heart size and mediastinal contours are within normal limits.
Both lungs are clear. The visualized skeletal structures are
unremarkable.
IMPRESSION: No active cardiopulmonary disease.

## 2023-09-02 DIAGNOSIS — J3089 Other allergic rhinitis: Secondary | ICD-10-CM | POA: Diagnosis not present

## 2023-09-02 DIAGNOSIS — J3081 Allergic rhinitis due to animal (cat) (dog) hair and dander: Secondary | ICD-10-CM | POA: Diagnosis not present

## 2023-09-02 DIAGNOSIS — J301 Allergic rhinitis due to pollen: Secondary | ICD-10-CM | POA: Diagnosis not present

## 2023-09-03 DIAGNOSIS — Z1231 Encounter for screening mammogram for malignant neoplasm of breast: Secondary | ICD-10-CM | POA: Diagnosis not present

## 2023-09-07 DIAGNOSIS — G43719 Chronic migraine without aura, intractable, without status migrainosus: Secondary | ICD-10-CM | POA: Diagnosis not present

## 2023-09-07 DIAGNOSIS — Z79899 Other long term (current) drug therapy: Secondary | ICD-10-CM | POA: Diagnosis not present

## 2023-09-07 DIAGNOSIS — Z049 Encounter for examination and observation for unspecified reason: Secondary | ICD-10-CM | POA: Diagnosis not present

## 2023-09-09 ENCOUNTER — Other Ambulatory Visit: Payer: Self-pay | Admitting: Family Medicine

## 2023-09-09 DIAGNOSIS — G518 Other disorders of facial nerve: Secondary | ICD-10-CM | POA: Diagnosis not present

## 2023-09-09 DIAGNOSIS — G43719 Chronic migraine without aura, intractable, without status migrainosus: Secondary | ICD-10-CM | POA: Diagnosis not present

## 2023-09-09 DIAGNOSIS — J3081 Allergic rhinitis due to animal (cat) (dog) hair and dander: Secondary | ICD-10-CM | POA: Diagnosis not present

## 2023-09-09 DIAGNOSIS — M791 Myalgia, unspecified site: Secondary | ICD-10-CM | POA: Diagnosis not present

## 2023-09-09 DIAGNOSIS — J301 Allergic rhinitis due to pollen: Secondary | ICD-10-CM | POA: Diagnosis not present

## 2023-09-09 DIAGNOSIS — J3089 Other allergic rhinitis: Secondary | ICD-10-CM | POA: Diagnosis not present

## 2023-09-09 DIAGNOSIS — M542 Cervicalgia: Secondary | ICD-10-CM | POA: Diagnosis not present

## 2023-09-09 DIAGNOSIS — F419 Anxiety disorder, unspecified: Secondary | ICD-10-CM

## 2023-09-09 MED ORDER — BUPROPION HCL ER (XL) 300 MG PO TB24: 300.0000 mg | ORAL_TABLET | Freq: Every day | ORAL | 0 refills | Status: DC

## 2023-09-09 NOTE — Telephone Encounter (Signed)
Pt hasn't been seen by me since January-- I will fill for 90 days but she will need to schedule an appointment to continue medication.

## 2023-09-17 DIAGNOSIS — J3081 Allergic rhinitis due to animal (cat) (dog) hair and dander: Secondary | ICD-10-CM | POA: Diagnosis not present

## 2023-09-17 DIAGNOSIS — J3089 Other allergic rhinitis: Secondary | ICD-10-CM | POA: Diagnosis not present

## 2023-09-17 DIAGNOSIS — J301 Allergic rhinitis due to pollen: Secondary | ICD-10-CM | POA: Diagnosis not present

## 2023-09-20 ENCOUNTER — Other Ambulatory Visit: Payer: Self-pay | Admitting: Obstetrics and Gynecology

## 2023-09-20 DIAGNOSIS — R92343 Mammographic extreme density, bilateral breasts: Secondary | ICD-10-CM

## 2023-09-22 ENCOUNTER — Other Ambulatory Visit: Payer: Self-pay | Admitting: Medical Genetics

## 2023-09-23 DIAGNOSIS — J3089 Other allergic rhinitis: Secondary | ICD-10-CM | POA: Diagnosis not present

## 2023-09-23 DIAGNOSIS — J3081 Allergic rhinitis due to animal (cat) (dog) hair and dander: Secondary | ICD-10-CM | POA: Diagnosis not present

## 2023-09-23 DIAGNOSIS — J301 Allergic rhinitis due to pollen: Secondary | ICD-10-CM | POA: Diagnosis not present

## 2023-09-28 DIAGNOSIS — G518 Other disorders of facial nerve: Secondary | ICD-10-CM | POA: Diagnosis not present

## 2023-09-28 DIAGNOSIS — M542 Cervicalgia: Secondary | ICD-10-CM | POA: Diagnosis not present

## 2023-09-28 DIAGNOSIS — G43719 Chronic migraine without aura, intractable, without status migrainosus: Secondary | ICD-10-CM | POA: Diagnosis not present

## 2023-09-28 DIAGNOSIS — M791 Myalgia, unspecified site: Secondary | ICD-10-CM | POA: Diagnosis not present

## 2023-10-05 ENCOUNTER — Other Ambulatory Visit: Payer: Self-pay | Admitting: Family Medicine

## 2023-10-05 DIAGNOSIS — I10 Essential (primary) hypertension: Secondary | ICD-10-CM

## 2023-10-05 DIAGNOSIS — J301 Allergic rhinitis due to pollen: Secondary | ICD-10-CM | POA: Diagnosis not present

## 2023-10-05 DIAGNOSIS — J3081 Allergic rhinitis due to animal (cat) (dog) hair and dander: Secondary | ICD-10-CM | POA: Diagnosis not present

## 2023-10-05 DIAGNOSIS — J3089 Other allergic rhinitis: Secondary | ICD-10-CM | POA: Diagnosis not present

## 2023-10-05 MED ORDER — LOSARTAN POTASSIUM 50 MG PO TABS: ORAL_TABLET | ORAL | 0 refills | Status: DC

## 2023-10-12 DIAGNOSIS — J3089 Other allergic rhinitis: Secondary | ICD-10-CM | POA: Diagnosis not present

## 2023-10-12 DIAGNOSIS — J3081 Allergic rhinitis due to animal (cat) (dog) hair and dander: Secondary | ICD-10-CM | POA: Diagnosis not present

## 2023-10-12 DIAGNOSIS — G43719 Chronic migraine without aura, intractable, without status migrainosus: Secondary | ICD-10-CM | POA: Diagnosis not present

## 2023-10-12 DIAGNOSIS — J301 Allergic rhinitis due to pollen: Secondary | ICD-10-CM | POA: Diagnosis not present

## 2023-10-12 DIAGNOSIS — M542 Cervicalgia: Secondary | ICD-10-CM | POA: Diagnosis not present

## 2023-10-15 ENCOUNTER — Encounter: Payer: Self-pay | Admitting: Obstetrics and Gynecology

## 2023-10-19 DIAGNOSIS — J3081 Allergic rhinitis due to animal (cat) (dog) hair and dander: Secondary | ICD-10-CM | POA: Diagnosis not present

## 2023-10-19 DIAGNOSIS — H1045 Other chronic allergic conjunctivitis: Secondary | ICD-10-CM | POA: Diagnosis not present

## 2023-10-19 DIAGNOSIS — J309 Allergic rhinitis, unspecified: Secondary | ICD-10-CM | POA: Diagnosis not present

## 2023-10-19 DIAGNOSIS — J3089 Other allergic rhinitis: Secondary | ICD-10-CM | POA: Diagnosis not present

## 2023-10-19 DIAGNOSIS — J301 Allergic rhinitis due to pollen: Secondary | ICD-10-CM | POA: Diagnosis not present

## 2023-10-26 DIAGNOSIS — J301 Allergic rhinitis due to pollen: Secondary | ICD-10-CM | POA: Diagnosis not present

## 2023-10-26 DIAGNOSIS — J3081 Allergic rhinitis due to animal (cat) (dog) hair and dander: Secondary | ICD-10-CM | POA: Diagnosis not present

## 2023-10-27 DIAGNOSIS — J3089 Other allergic rhinitis: Secondary | ICD-10-CM | POA: Diagnosis not present

## 2023-10-28 DIAGNOSIS — J3089 Other allergic rhinitis: Secondary | ICD-10-CM | POA: Diagnosis not present

## 2023-10-28 DIAGNOSIS — J3081 Allergic rhinitis due to animal (cat) (dog) hair and dander: Secondary | ICD-10-CM | POA: Diagnosis not present

## 2023-10-28 DIAGNOSIS — J301 Allergic rhinitis due to pollen: Secondary | ICD-10-CM | POA: Diagnosis not present

## 2023-10-30 ENCOUNTER — Ambulatory Visit
Admission: RE | Admit: 2023-10-30 | Discharge: 2023-10-30 | Disposition: A | Discharge status: Home / Self Care | Payer: BC Managed Care – PPO | Source: Ambulatory Visit | Attending: Obstetrics and Gynecology

## 2023-10-30 DIAGNOSIS — Z803 Family history of malignant neoplasm of breast: Secondary | ICD-10-CM | POA: Diagnosis not present

## 2023-10-30 DIAGNOSIS — R92343 Mammographic extreme density, bilateral breasts: Secondary | ICD-10-CM

## 2023-10-30 DIAGNOSIS — Z1239 Encounter for other screening for malignant neoplasm of breast: Secondary | ICD-10-CM | POA: Diagnosis not present

## 2023-10-30 MED ORDER — GADOPICLENOL 0.5 MMOL/ML IV SOLN
7.5000 mL | Freq: Once | INTRAVENOUS | Status: AC | PRN
  Administered 2023-10-30: 6 mL via INTRAVENOUS

## 2023-11-01 LAB — HM MAMMOGRAPHY

## 2023-11-05 ENCOUNTER — Ambulatory Visit: Payer: BC Managed Care – PPO | Admitting: Family Medicine

## 2023-11-05 DIAGNOSIS — J3089 Other allergic rhinitis: Secondary | ICD-10-CM | POA: Diagnosis not present

## 2023-11-05 DIAGNOSIS — J301 Allergic rhinitis due to pollen: Secondary | ICD-10-CM | POA: Diagnosis not present

## 2023-11-05 DIAGNOSIS — J3081 Allergic rhinitis due to animal (cat) (dog) hair and dander: Secondary | ICD-10-CM | POA: Diagnosis not present

## 2023-11-07 DIAGNOSIS — J029 Acute pharyngitis, unspecified: Secondary | ICD-10-CM | POA: Diagnosis not present

## 2023-11-07 DIAGNOSIS — Z20822 Contact with and (suspected) exposure to covid-19: Secondary | ICD-10-CM | POA: Diagnosis not present

## 2023-11-07 DIAGNOSIS — R509 Fever, unspecified: Secondary | ICD-10-CM | POA: Diagnosis not present

## 2023-11-08 ENCOUNTER — Other Ambulatory Visit: Payer: Self-pay | Admitting: Family Medicine

## 2023-11-08 DIAGNOSIS — I10 Essential (primary) hypertension: Secondary | ICD-10-CM

## 2023-11-08 DIAGNOSIS — F419 Anxiety disorder, unspecified: Secondary | ICD-10-CM

## 2023-11-09 MED ORDER — VENLAFAXINE HCL ER 37.5 MG PO CP24: 37.5000 mg | ORAL_CAPSULE | Freq: Every day | ORAL | 0 refills | Status: DC

## 2023-11-09 MED ORDER — LOSARTAN POTASSIUM 50 MG PO TABS: ORAL_TABLET | ORAL | 0 refills | Status: DC

## 2023-11-19 DIAGNOSIS — J301 Allergic rhinitis due to pollen: Secondary | ICD-10-CM | POA: Diagnosis not present

## 2023-11-19 DIAGNOSIS — J3081 Allergic rhinitis due to animal (cat) (dog) hair and dander: Secondary | ICD-10-CM | POA: Diagnosis not present

## 2023-11-19 DIAGNOSIS — J3089 Other allergic rhinitis: Secondary | ICD-10-CM | POA: Diagnosis not present

## 2023-11-22 ENCOUNTER — Ambulatory Visit: Payer: BC Managed Care – PPO | Admitting: Family Medicine

## 2023-11-25 DIAGNOSIS — G43719 Chronic migraine without aura, intractable, without status migrainosus: Secondary | ICD-10-CM | POA: Diagnosis not present

## 2023-11-25 DIAGNOSIS — M542 Cervicalgia: Secondary | ICD-10-CM | POA: Diagnosis not present

## 2023-11-30 DIAGNOSIS — J3081 Allergic rhinitis due to animal (cat) (dog) hair and dander: Secondary | ICD-10-CM | POA: Diagnosis not present

## 2023-11-30 DIAGNOSIS — J3089 Other allergic rhinitis: Secondary | ICD-10-CM | POA: Diagnosis not present

## 2023-11-30 DIAGNOSIS — J301 Allergic rhinitis due to pollen: Secondary | ICD-10-CM | POA: Diagnosis not present

## 2023-12-02 ENCOUNTER — Ambulatory Visit: Payer: BC Managed Care – PPO | Admitting: Family Medicine

## 2023-12-02 VITALS — BP 120/82 | HR 69 | Temp 98.3°F | Ht 62.0 in | Wt 131.5 lb

## 2023-12-02 DIAGNOSIS — I10 Essential (primary) hypertension: Secondary | ICD-10-CM

## 2023-12-02 DIAGNOSIS — F419 Anxiety disorder, unspecified: Secondary | ICD-10-CM | POA: Diagnosis not present

## 2023-12-02 DIAGNOSIS — F32A Depression, unspecified: Secondary | ICD-10-CM | POA: Diagnosis not present

## 2023-12-02 MED ORDER — BUPROPION HCL ER (XL) 300 MG PO TB24: 300.0000 mg | ORAL_TABLET | Freq: Every day | ORAL | 1 refills | Status: DC

## 2023-12-02 MED ORDER — LOSARTAN POTASSIUM 50 MG PO TABS
ORAL_TABLET | ORAL | 1 refills | Status: DC
Start: 2023-12-02 — End: 2024-06-15

## 2023-12-02 MED ORDER — VENLAFAXINE HCL ER 37.5 MG PO CP24: 37.5000 mg | ORAL_CAPSULE | Freq: Every day | ORAL | 0 refills | Status: DC

## 2023-12-02 NOTE — Assessment & Plan Note (Signed)
 Sx well controlled on wellbutrin 300 mg daily, she may still try to stop the effexor, however I will go ahead and refill it for her today and she will let me know what she decides to do.

## 2023-12-02 NOTE — Assessment & Plan Note (Signed)
 BP is stable today, will continue losartan 50 mg daily as prescribed. She will see me back in 6 months for her annual physical.

## 2023-12-02 NOTE — Progress Notes (Signed)
 Established Patient Office Visit  Subjective   Patient ID: Alison [name], female    DOB: 02-Feb-1981  Age: 43 y.o. MRN: [id number]  Chief Complaint  Patient presents with   Medical Management of Chronic Issues    Pt is here for follow up today on her depression symptoms. She reports that the wellbutrin is working well to control her symptoms, gives her more energy and focus. Pt states that she would like to come off of the effexor, doesn't think this medication is helping her much at this point, but every time she tries to stop the medication it makes her feel "terrible". I counseled the patient on different strategies on stopping the medication vs. Staying on the medication.   HTN -- BP in office performed and is well controlled. She  reports no side effects to the medications, no chest pain, SOB, dizziness or headaches. She has a BP cuff at home and is checking BP regularly, reports they are in the normal range.        Current Outpatient Medications  Medication Instructions   buPROPion (WELLBUTRIN XL) 300 mg, Oral, Daily   Cholecalciferol (VITAMIN D PO) Take by mouth.   losartan (COZAAR) 50 MG tablet TAKE 1 TABLET(50 MG) BY MOUTH DAILY   Multiple Vitamin (MULTIVITAMIN) capsule 1 capsule, Daily   OZEMPIC, 0.25 OR 0.5 MG/DOSE, 2 MG/3ML SOPN Inject into the skin.   venlafaxine XR (EFFEXOR-XR) 37.5 mg, Oral, Daily with breakfast    Patient Active Problem List   Diagnosis Date Noted   Tendinopathy of gluteus medius 08/19/2022   Hypertension 12/20/2016   Other fatigue 12/20/2016   Precordial chest pain 12/20/2016   Seasonal allergies 12/28/2014   Anxiety and depression 12/28/2014      Review of Systems  All other systems reviewed and are negative.     Objective:     BP 120/82   Pulse 69   Temp 98.3 F (36.8 C) (Oral)   Ht 5\' 2"  (1.575 m)   Wt 131 lb 8 oz (59.6 kg)   LMP 11/29/2023 (Exact Date)   SpO2 98%   BMI 24.05 kg/m    Physical Exam Vitals reviewed.   Constitutional:      Appearance: Normal appearance. She is well-groomed and normal weight.  Eyes:     Conjunctiva/sclera: Conjunctivae normal.  Neck:     Thyroid: No thyromegaly.  Cardiovascular:     Rate and Rhythm: Normal rate and regular rhythm.     Pulses: Normal pulses.     Heart sounds: S1 normal and S2 normal.  Pulmonary:     Effort: Pulmonary effort is normal.     Breath sounds: Normal breath sounds and air entry.  Abdominal:     General: Bowel sounds are normal.  Musculoskeletal:     Right lower leg: No edema.     Left lower leg: No edema.  Neurological:     Mental Status: She is alert and oriented to person, place, and time. Mental status is at baseline.     Gait: Gait is intact.  Psychiatric:        Mood and Affect: Mood and affect normal.        Speech: Speech normal.        Behavior: Behavior normal.        Judgment: Judgment normal.      No results found for any visits on 12/02/23.    The 10-year ASCVD risk score (Arnett DK, et al., 2019) is: 0.9%  Assessment & Plan:  Primary hypertension Assessment & Plan: BP is stable today, will continue losartan 50 mg daily as prescribed. She will see me back in 6 months for her annual physical.   Orders: -     Losartan Potassium; TAKE 1 TABLET(50 MG) BY MOUTH DAILY  Dispense: 90 tablet; Refill: 1  Anxiety and depression Assessment & Plan: Sx well controlled on wellbutrin 300 mg daily, she may still try to stop the effexor, however I will go ahead and refill it for her today and she will let me know what she decides to do.   Orders: -     buPROPion HCl ER (XL); Take 1 tablet (300 mg total) by mouth daily.  Dispense: 90 tablet; Refill: 1 -     Venlafaxine HCl ER; Take 1 capsule (37.5 mg total) by mouth daily with breakfast.  Dispense: 90 capsule; Refill: 0     Return in about 6 months (around 06/03/2024) for annual physical exam.    Karie Georges, MD

## 2023-12-07 DIAGNOSIS — G43719 Chronic migraine without aura, intractable, without status migrainosus: Secondary | ICD-10-CM | POA: Diagnosis not present

## 2023-12-07 DIAGNOSIS — M542 Cervicalgia: Secondary | ICD-10-CM | POA: Diagnosis not present

## 2023-12-16 DIAGNOSIS — J3089 Other allergic rhinitis: Secondary | ICD-10-CM | POA: Diagnosis not present

## 2023-12-16 DIAGNOSIS — J3081 Allergic rhinitis due to animal (cat) (dog) hair and dander: Secondary | ICD-10-CM | POA: Diagnosis not present

## 2023-12-16 DIAGNOSIS — J301 Allergic rhinitis due to pollen: Secondary | ICD-10-CM | POA: Diagnosis not present

## 2023-12-20 DIAGNOSIS — J3089 Other allergic rhinitis: Secondary | ICD-10-CM | POA: Diagnosis not present

## 2023-12-20 DIAGNOSIS — J3081 Allergic rhinitis due to animal (cat) (dog) hair and dander: Secondary | ICD-10-CM | POA: Diagnosis not present

## 2023-12-20 DIAGNOSIS — J301 Allergic rhinitis due to pollen: Secondary | ICD-10-CM | POA: Diagnosis not present

## 2023-12-28 DIAGNOSIS — M542 Cervicalgia: Secondary | ICD-10-CM | POA: Diagnosis not present

## 2023-12-28 DIAGNOSIS — G43719 Chronic migraine without aura, intractable, without status migrainosus: Secondary | ICD-10-CM | POA: Diagnosis not present

## 2023-12-30 DIAGNOSIS — J3089 Other allergic rhinitis: Secondary | ICD-10-CM | POA: Diagnosis not present

## 2023-12-30 DIAGNOSIS — J301 Allergic rhinitis due to pollen: Secondary | ICD-10-CM | POA: Diagnosis not present

## 2023-12-30 DIAGNOSIS — J3081 Allergic rhinitis due to animal (cat) (dog) hair and dander: Secondary | ICD-10-CM | POA: Diagnosis not present

## 2024-01-06 DIAGNOSIS — J3089 Other allergic rhinitis: Secondary | ICD-10-CM | POA: Diagnosis not present

## 2024-01-06 DIAGNOSIS — J301 Allergic rhinitis due to pollen: Secondary | ICD-10-CM | POA: Diagnosis not present

## 2024-01-06 DIAGNOSIS — J3081 Allergic rhinitis due to animal (cat) (dog) hair and dander: Secondary | ICD-10-CM | POA: Diagnosis not present

## 2024-01-18 DIAGNOSIS — J3081 Allergic rhinitis due to animal (cat) (dog) hair and dander: Secondary | ICD-10-CM | POA: Diagnosis not present

## 2024-01-18 DIAGNOSIS — J301 Allergic rhinitis due to pollen: Secondary | ICD-10-CM | POA: Diagnosis not present

## 2024-01-18 DIAGNOSIS — J3089 Other allergic rhinitis: Secondary | ICD-10-CM | POA: Diagnosis not present

## 2024-01-28 DIAGNOSIS — J3089 Other allergic rhinitis: Secondary | ICD-10-CM | POA: Diagnosis not present

## 2024-01-28 DIAGNOSIS — J301 Allergic rhinitis due to pollen: Secondary | ICD-10-CM | POA: Diagnosis not present

## 2024-01-28 DIAGNOSIS — J3081 Allergic rhinitis due to animal (cat) (dog) hair and dander: Secondary | ICD-10-CM | POA: Diagnosis not present

## 2024-02-08 DIAGNOSIS — L570 Actinic keratosis: Secondary | ICD-10-CM | POA: Diagnosis not present

## 2024-02-08 DIAGNOSIS — G43719 Chronic migraine without aura, intractable, without status migrainosus: Secondary | ICD-10-CM | POA: Diagnosis not present

## 2024-02-08 DIAGNOSIS — M542 Cervicalgia: Secondary | ICD-10-CM | POA: Diagnosis not present

## 2024-02-08 DIAGNOSIS — D2262 Melanocytic nevi of left upper limb, including shoulder: Secondary | ICD-10-CM | POA: Diagnosis not present

## 2024-02-08 DIAGNOSIS — D225 Melanocytic nevi of trunk: Secondary | ICD-10-CM | POA: Diagnosis not present

## 2024-02-08 DIAGNOSIS — Z85828 Personal history of other malignant neoplasm of skin: Secondary | ICD-10-CM | POA: Diagnosis not present

## 2024-02-08 DIAGNOSIS — D2261 Melanocytic nevi of right upper limb, including shoulder: Secondary | ICD-10-CM | POA: Diagnosis not present

## 2024-02-08 DIAGNOSIS — B078 Other viral warts: Secondary | ICD-10-CM | POA: Diagnosis not present

## 2024-02-10 DIAGNOSIS — J3081 Allergic rhinitis due to animal (cat) (dog) hair and dander: Secondary | ICD-10-CM | POA: Diagnosis not present

## 2024-02-10 DIAGNOSIS — J3089 Other allergic rhinitis: Secondary | ICD-10-CM | POA: Diagnosis not present

## 2024-02-10 DIAGNOSIS — J301 Allergic rhinitis due to pollen: Secondary | ICD-10-CM | POA: Diagnosis not present

## 2024-02-25 DIAGNOSIS — J3081 Allergic rhinitis due to animal (cat) (dog) hair and dander: Secondary | ICD-10-CM | POA: Diagnosis not present

## 2024-02-25 DIAGNOSIS — J301 Allergic rhinitis due to pollen: Secondary | ICD-10-CM | POA: Diagnosis not present

## 2024-02-25 DIAGNOSIS — J3089 Other allergic rhinitis: Secondary | ICD-10-CM | POA: Diagnosis not present

## 2024-02-29 DIAGNOSIS — R61 Generalized hyperhidrosis: Secondary | ICD-10-CM | POA: Diagnosis not present

## 2024-02-29 DIAGNOSIS — G4700 Insomnia, unspecified: Secondary | ICD-10-CM | POA: Diagnosis not present

## 2024-02-29 DIAGNOSIS — Z1330 Encounter for screening examination for mental health and behavioral disorders, unspecified: Secondary | ICD-10-CM | POA: Diagnosis not present

## 2024-02-29 DIAGNOSIS — M7918 Myalgia, other site: Secondary | ICD-10-CM | POA: Diagnosis not present

## 2024-02-29 DIAGNOSIS — R6882 Decreased libido: Secondary | ICD-10-CM | POA: Diagnosis not present

## 2024-03-01 DIAGNOSIS — L659 Nonscarring hair loss, unspecified: Secondary | ICD-10-CM | POA: Diagnosis not present

## 2024-03-02 ENCOUNTER — Other Ambulatory Visit: Payer: Self-pay | Admitting: Family Medicine

## 2024-03-02 DIAGNOSIS — F32A Depression, unspecified: Secondary | ICD-10-CM

## 2024-03-09 DIAGNOSIS — J3089 Other allergic rhinitis: Secondary | ICD-10-CM | POA: Diagnosis not present

## 2024-03-09 DIAGNOSIS — J3081 Allergic rhinitis due to animal (cat) (dog) hair and dander: Secondary | ICD-10-CM | POA: Diagnosis not present

## 2024-03-09 DIAGNOSIS — J301 Allergic rhinitis due to pollen: Secondary | ICD-10-CM | POA: Diagnosis not present

## 2024-03-28 DIAGNOSIS — J3089 Other allergic rhinitis: Secondary | ICD-10-CM | POA: Diagnosis not present

## 2024-03-28 DIAGNOSIS — J3081 Allergic rhinitis due to animal (cat) (dog) hair and dander: Secondary | ICD-10-CM | POA: Diagnosis not present

## 2024-03-28 DIAGNOSIS — J301 Allergic rhinitis due to pollen: Secondary | ICD-10-CM | POA: Diagnosis not present

## 2024-03-30 NOTE — Progress Notes (Signed)
 [facility] Quality Team Note  Name: Alison [name] Date of Birth: 09-30-1980 MRN: [id number] Date: 03/30/2024  [facility] Quality Team has reviewed this patient's chart, please see recommendations below:  [facility] Quality Other; (CHART REVIEWED. ABSTRACTED MOST RECENT BLOOD PRESSURE FOR GAP CLOSURE.)

## 2024-04-04 DIAGNOSIS — G43719 Chronic migraine without aura, intractable, without status migrainosus: Secondary | ICD-10-CM | POA: Diagnosis not present

## 2024-04-04 DIAGNOSIS — M542 Cervicalgia: Secondary | ICD-10-CM | POA: Diagnosis not present

## 2024-04-11 DIAGNOSIS — J3081 Allergic rhinitis due to animal (cat) (dog) hair and dander: Secondary | ICD-10-CM | POA: Diagnosis not present

## 2024-04-11 DIAGNOSIS — J301 Allergic rhinitis due to pollen: Secondary | ICD-10-CM | POA: Diagnosis not present

## 2024-04-11 DIAGNOSIS — J3089 Other allergic rhinitis: Secondary | ICD-10-CM | POA: Diagnosis not present

## 2024-04-12 ENCOUNTER — Encounter: Payer: Self-pay | Admitting: Family Medicine

## 2024-04-14 ENCOUNTER — Ambulatory Visit: Admitting: Family Medicine

## 2024-04-20 DIAGNOSIS — J301 Allergic rhinitis due to pollen: Secondary | ICD-10-CM | POA: Diagnosis not present

## 2024-04-20 DIAGNOSIS — J3081 Allergic rhinitis due to animal (cat) (dog) hair and dander: Secondary | ICD-10-CM | POA: Diagnosis not present

## 2024-04-20 DIAGNOSIS — J3089 Other allergic rhinitis: Secondary | ICD-10-CM | POA: Diagnosis not present

## 2024-05-02 DIAGNOSIS — J3081 Allergic rhinitis due to animal (cat) (dog) hair and dander: Secondary | ICD-10-CM | POA: Diagnosis not present

## 2024-05-02 DIAGNOSIS — J3089 Other allergic rhinitis: Secondary | ICD-10-CM | POA: Diagnosis not present

## 2024-05-02 DIAGNOSIS — J301 Allergic rhinitis due to pollen: Secondary | ICD-10-CM | POA: Diagnosis not present

## 2024-05-16 DIAGNOSIS — J301 Allergic rhinitis due to pollen: Secondary | ICD-10-CM | POA: Diagnosis not present

## 2024-05-16 DIAGNOSIS — J3089 Other allergic rhinitis: Secondary | ICD-10-CM | POA: Diagnosis not present

## 2024-05-16 DIAGNOSIS — J3081 Allergic rhinitis due to animal (cat) (dog) hair and dander: Secondary | ICD-10-CM | POA: Diagnosis not present

## 2024-05-25 DIAGNOSIS — G43719 Chronic migraine without aura, intractable, without status migrainosus: Secondary | ICD-10-CM | POA: Diagnosis not present

## 2024-05-25 DIAGNOSIS — M542 Cervicalgia: Secondary | ICD-10-CM | POA: Diagnosis not present

## 2024-05-30 DIAGNOSIS — J301 Allergic rhinitis due to pollen: Secondary | ICD-10-CM | POA: Diagnosis not present

## 2024-05-30 DIAGNOSIS — J3089 Other allergic rhinitis: Secondary | ICD-10-CM | POA: Diagnosis not present

## 2024-05-30 DIAGNOSIS — J3081 Allergic rhinitis due to animal (cat) (dog) hair and dander: Secondary | ICD-10-CM | POA: Diagnosis not present

## 2024-06-05 DIAGNOSIS — M79641 Pain in right hand: Secondary | ICD-10-CM | POA: Diagnosis not present

## 2024-06-06 ENCOUNTER — Other Ambulatory Visit: Payer: Self-pay | Admitting: Family Medicine

## 2024-06-06 DIAGNOSIS — F32A Depression, unspecified: Secondary | ICD-10-CM

## 2024-06-08 DIAGNOSIS — J3081 Allergic rhinitis due to animal (cat) (dog) hair and dander: Secondary | ICD-10-CM | POA: Diagnosis not present

## 2024-06-08 DIAGNOSIS — J3089 Other allergic rhinitis: Secondary | ICD-10-CM | POA: Diagnosis not present

## 2024-06-08 DIAGNOSIS — J301 Allergic rhinitis due to pollen: Secondary | ICD-10-CM | POA: Diagnosis not present

## 2024-06-15 ENCOUNTER — Encounter: Payer: Self-pay | Admitting: Family Medicine

## 2024-06-15 ENCOUNTER — Telehealth: Payer: Self-pay | Admitting: *Deleted

## 2024-06-15 ENCOUNTER — Ambulatory Visit (INDEPENDENT_AMBULATORY_CARE_PROVIDER_SITE_OTHER): Admitting: Family Medicine

## 2024-06-15 VITALS — BP 110/82 | HR 64 | Temp 98.7°F | Ht 62.0 in | Wt 127.0 lb

## 2024-06-15 DIAGNOSIS — J301 Allergic rhinitis due to pollen: Secondary | ICD-10-CM | POA: Diagnosis not present

## 2024-06-15 DIAGNOSIS — Z13220 Encounter for screening for lipoid disorders: Secondary | ICD-10-CM

## 2024-06-15 DIAGNOSIS — F32A Depression, unspecified: Secondary | ICD-10-CM

## 2024-06-15 DIAGNOSIS — Z0000 Encounter for general adult medical examination without abnormal findings: Secondary | ICD-10-CM | POA: Diagnosis not present

## 2024-06-15 DIAGNOSIS — I10 Essential (primary) hypertension: Secondary | ICD-10-CM

## 2024-06-15 DIAGNOSIS — F419 Anxiety disorder, unspecified: Secondary | ICD-10-CM | POA: Diagnosis not present

## 2024-06-15 DIAGNOSIS — J3089 Other allergic rhinitis: Secondary | ICD-10-CM | POA: Diagnosis not present

## 2024-06-15 DIAGNOSIS — J3081 Allergic rhinitis due to animal (cat) (dog) hair and dander: Secondary | ICD-10-CM | POA: Diagnosis not present

## 2024-06-15 MED ORDER — LOSARTAN POTASSIUM 50 MG PO TABS: 25.0000 mg | ORAL_TABLET | Freq: Every day | ORAL | 1 refills | Status: DC

## 2024-06-15 MED ORDER — FLUOXETINE HCL 10 MG PO CAPS: ORAL_CAPSULE | ORAL | 0 refills | Status: AC

## 2024-06-15 NOTE — Progress Notes (Signed)
 Complete physical exam  Patient: Alison [name]   DOB: 27-Apr-1981   43 y.o. Female  MRN: [id number]  Subjective:    Chief Complaint  Patient presents with   Annual Exam    Alison [name] is a 43 y.o. female who presents today for a complete physical exam. She reports consuming a gluten free diet. Calorie goal is around 1600, getting good sources of veggies and eating around 80 grams of protein per day. Has given up regular soda, eats some dairy also. Takes MVI daily, turmeric, fish oil. Home exercise routine includes walking 1 hrs per week. She generally feels well. She reports sleeping fairly well. She does not have additional problems to discuss today.    Most recent fall risk assessment:     No data to display           Most recent depression screenings:    12/02/2023   10:05 AM 05/17/2023    2:07 PM  PHQ 2/9 Scores  PHQ - 2 Score 0 0  PHQ- 9 Score 2 0    Vision:Not within last year  and pt thinks her vision has changed a little  and Dental: No current dental problems and Receives regular dental care  Patient Active Problem List   Diagnosis Date Noted   Tendinopathy of gluteus medius 08/19/2022   Hypertension 12/20/2016   Other fatigue 12/20/2016   Precordial chest pain 12/20/2016   Seasonal allergies 12/28/2014   Anxiety and depression 12/28/2014      Patient Care Team: Ozell Heron CHRISTELLA, MD as PCP - General (Family Medicine) Con Merle, CNM as Midwife (Obstetrics and Gynecology) Dolphus Reiter, MD as Consulting Physician (Rheumatology)   Outpatient Medications Prior to Visit  Medication Sig   buPROPion  (WELLBUTRIN  XL) 300 MG 24 hr tablet TAKE 1 TABLET(300 MG) BY MOUTH DAILY   Cholecalciferol (VITAMIN D PO) Take by mouth.   Multiple Vitamin (MULTIVITAMIN) capsule Take 1 capsule by mouth daily.   OZEMPIC, 0.25 OR 0.5 MG/DOSE, 2 MG/3ML SOPN Inject into the skin.   [DISCONTINUED] losartan  (COZAAR ) 50 MG tablet TAKE 1 TABLET(50 MG) BY MOUTH DAILY (Patient  taking differently: 25 mg. TAKE 1 TABLET(50 MG) BY MOUTH DAILY)   [DISCONTINUED] venlafaxine  XR (EFFEXOR -XR) 37.5 MG 24 hr capsule TAKE 1 CAPSULE(37.5 MG) BY MOUTH DAILY WITH BREAKFAST   No facility-administered medications prior to visit.    Review of Systems  HENT:  Negative for hearing loss.   Eyes:  Negative for blurred vision.  Respiratory:  Negative for shortness of breath.   Cardiovascular:  Negative for chest pain.  Gastrointestinal: Negative.   Genitourinary: Negative.   Musculoskeletal:  Negative for back pain.  Neurological:  Negative for headaches.  Psychiatric/Behavioral:  Negative for depression.        Objective:     BP 110/82   Pulse 64   Temp 98.7 F (37.1 C) (Oral)   Ht 5' 2 (1.575 m)   Wt 127 lb (57.6 kg)   SpO2 98%   BMI 23.23 kg/m    Physical Exam Vitals reviewed.  Constitutional:      Appearance: Normal appearance. She is well-groomed and normal weight.  HENT:     Right Ear: Tympanic membrane and ear canal normal.     Left Ear: Tympanic membrane and ear canal normal.     Mouth/Throat:     Mouth: Mucous membranes are moist.     Pharynx: No posterior oropharyngeal erythema.  Eyes:     Conjunctiva/sclera: Conjunctivae  normal.  Neck:     Thyroid : No thyromegaly.  Cardiovascular:     Rate and Rhythm: Normal rate and regular rhythm.     Pulses: Normal pulses.     Heart sounds: S1 normal and S2 normal.  Pulmonary:     Effort: Pulmonary effort is normal.     Breath sounds: Normal breath sounds and air entry.  Abdominal:     General: Abdomen is flat. Bowel sounds are normal.     Palpations: Abdomen is soft.  Musculoskeletal:     Right lower leg: No edema.     Left lower leg: No edema.  Lymphadenopathy:     Cervical: No cervical adenopathy.  Neurological:     Mental Status: She is alert and oriented to person, place, and time. Mental status is at baseline.     Gait: Gait is intact.  Psychiatric:        Mood and Affect: Mood and affect  normal.        Speech: Speech normal.        Behavior: Behavior normal.        Judgment: Judgment normal.      No results found for any visits on 06/15/24.     Assessment & Plan:    Routine Health Maintenance and Physical Exam  Immunization History  Administered Date(s) Administered   Influenza Whole 06/28/2009   Influenza-Unspecified 06/28/2016, 06/29/2019, 06/28/2021, 06/29/2023   Moderna Sars-Covid-2 Vaccination 09/28/2019, 10/26/2019, 10/03/2020   Td 09/29/2007   Tdap 12/31/2010, 09/28/2017, 10/15/2017    Health Maintenance  Topic Date Due   Hepatitis B Vaccines 19-59 Average Risk (1 of 3 - 19+ 3-dose series) Never done   HPV VACCINES (1 - 3-dose SCDM series) Never done   COVID-19 Vaccine (4 - 2025-26 season) 05/29/2024   Hepatitis C Screening  12/01/2024 (Originally 07/06/1998)   Influenza Vaccine  12/26/2024 (Originally 04/28/2024)   Mammogram  10/31/2025   Cervical Cancer Screening (HPV/Pap Cotest)  07/19/2026   DTaP/Tdap/Td (5 - Td or Tdap) 10/16/2027   HIV Screening  Completed   Pneumococcal Vaccine  Aged Out   Meningococcal B Vaccine  Aged Out    Discussed health benefits of physical activity, and encouraged her to engage in regular exercise appropriate for her age and condition.  Anxiety and depression -     FLUoxetine  HCl; Take 1 capsule (10 mg total) by mouth daily for 14 days, THEN 1 capsule (10 mg total) every other day for 14 days, THEN 1 capsule (10 mg total) every 3 (three) days for 14 days.  Dispense: 30 capsule; Refill: 0  Primary hypertension -     Losartan  Potassium; Take 0.5 tablets (25 mg total) by mouth daily. TAKE 1 TABLET(50 MG) BY MOUTH DAILY  Dispense: 45 tablet; Refill: 1 -     Comprehensive metabolic panel with GFR; Future -     CBC with Differential/Platelet; Future  Lipid screening -     Lipid panel; Future  Routine general medical examination at a health care facility  General physical exam findings are normal today. I reviewed the  patient's preventative testing, immunizations, and lifestyle habits. I made appropriate recommendations and placed orders for the appropriate tests and/or vaccinations. I counseled the patient on the CDC's recommendations for healthy exercise and diet. I counseled the patient on healthy sleep habits and stress management. Handouts to reinforce the counseling were given at the conclusion of the visit.    Return in 1 year (on 06/15/2025).  Heron CHRISTELLA Sharper, MD

## 2024-06-15 NOTE — Patient Instructions (Addendum)

## 2024-06-15 NOTE — Telephone Encounter (Signed)
 Copied from CRM [id number]. Topic: Clinical - Prescription Issue >> Jun 15, 2024  4:18 PM Delon DASEN wrote: Reason for CRM: Bill with [facility] - need clarification on losartan  (COZAAR ) 50 MG tablet directions- [phone]

## 2024-06-20 ENCOUNTER — Other Ambulatory Visit: Payer: Self-pay | Admitting: Family Medicine

## 2024-06-20 DIAGNOSIS — I10 Essential (primary) hypertension: Secondary | ICD-10-CM

## 2024-06-20 MED ORDER — LOSARTAN POTASSIUM 50 MG PO TABS
25.0000 mg | ORAL_TABLET | Freq: Every day | ORAL | 1 refills | Status: AC
Start: 2024-06-20 — End: ?

## 2024-06-20 NOTE — Telephone Encounter (Signed)
 Noted

## 2024-06-20 NOTE — Telephone Encounter (Signed)
 Sent new script with corrected instructions

## 2024-06-22 ENCOUNTER — Ambulatory Visit: Payer: Self-pay | Admitting: Family Medicine

## 2024-06-22 ENCOUNTER — Other Ambulatory Visit (INDEPENDENT_AMBULATORY_CARE_PROVIDER_SITE_OTHER)

## 2024-06-22 DIAGNOSIS — J301 Allergic rhinitis due to pollen: Secondary | ICD-10-CM | POA: Diagnosis not present

## 2024-06-22 DIAGNOSIS — J3081 Allergic rhinitis due to animal (cat) (dog) hair and dander: Secondary | ICD-10-CM | POA: Diagnosis not present

## 2024-06-22 DIAGNOSIS — J3089 Other allergic rhinitis: Secondary | ICD-10-CM | POA: Diagnosis not present

## 2024-06-22 DIAGNOSIS — I10 Essential (primary) hypertension: Secondary | ICD-10-CM | POA: Diagnosis not present

## 2024-06-22 DIAGNOSIS — Z13220 Encounter for screening for lipoid disorders: Secondary | ICD-10-CM

## 2024-06-22 LAB — COMPREHENSIVE METABOLIC PANEL WITH GFR
ALT: 13 U/L (ref 0–35)
AST: 17 U/L (ref 0–37)
Albumin: 4.1 g/dL (ref 3.5–5.2)
Alkaline Phosphatase: 43 U/L (ref 39–117)
BUN: 19 mg/dL (ref 6–23)
CO2: 27 meq/L (ref 19–32)
Calcium: 9.1 mg/dL (ref 8.4–10.5)
Chloride: 106 meq/L (ref 96–112)
Creatinine, Ser: 1.03 mg/dL (ref 0.40–1.20)
GFR: 66.38 mL/min (ref >60.00)
Glucose, Bld: 91 mg/dL (ref 70–99)
Potassium: 4.2 meq/L (ref 3.5–5.1)
Sodium: 139 meq/L (ref 135–145)
Total Bilirubin: 0.5 mg/dL (ref 0.2–1.2)
Total Protein: 6.4 g/dL (ref 6.0–8.3)

## 2024-06-22 LAB — CBC WITH DIFFERENTIAL/PLATELET
Basophils Absolute: 0 K/uL (ref 0.0–0.1)
Basophils Relative: 0.7 % (ref 0.0–3.0)
Eosinophils Absolute: 0.1 K/uL (ref 0.0–0.7)
Eosinophils Relative: 2.4 % (ref 0.0–5.0)
HCT: 40.5 % (ref 36.0–46.0)
Hemoglobin: 14 g/dL (ref 12.0–15.0)
Lymphocytes Relative: 30.8 % (ref 12.0–46.0)
Lymphs Abs: 1.4 K/uL (ref 0.7–4.0)
MCHC: 34.6 g/dL (ref 30.0–36.0)
MCV: 91.6 fl (ref 78.0–100.0)
Monocytes Absolute: 0.3 K/uL (ref 0.1–1.0)
Monocytes Relative: 7.2 % (ref 3.0–12.0)
Neutro Abs: 2.7 K/uL (ref 1.4–7.7)
Neutrophils Relative %: 58.9 % (ref 43.0–77.0)
Platelets: 196 K/uL (ref 150.0–400.0)
RBC: 4.43 Mil/uL (ref 3.87–5.11)
RDW: 13.3 % (ref 11.5–15.5)
WBC: 4.5 K/uL (ref 4.0–10.5)

## 2024-06-22 LAB — LIPID PANEL
Cholesterol: 135 mg/dL (ref 0–200)
HDL: 57 mg/dL (ref >39.00)
LDL Cholesterol: 70 mg/dL (ref 0–99)
NonHDL: 77.95
Total CHOL/HDL Ratio: 2
Triglycerides: 42 mg/dL (ref 0.0–149.0)
VLDL: 8.4 mg/dL (ref 0.0–40.0)

## 2024-07-04 DIAGNOSIS — G43719 Chronic migraine without aura, intractable, without status migrainosus: Secondary | ICD-10-CM | POA: Diagnosis not present

## 2024-07-04 DIAGNOSIS — J301 Allergic rhinitis due to pollen: Secondary | ICD-10-CM | POA: Diagnosis not present

## 2024-07-04 DIAGNOSIS — M542 Cervicalgia: Secondary | ICD-10-CM | POA: Diagnosis not present

## 2024-07-04 DIAGNOSIS — J3089 Other allergic rhinitis: Secondary | ICD-10-CM | POA: Diagnosis not present

## 2024-07-04 DIAGNOSIS — J3081 Allergic rhinitis due to animal (cat) (dog) hair and dander: Secondary | ICD-10-CM | POA: Diagnosis not present

## 2024-07-14 DIAGNOSIS — J3081 Allergic rhinitis due to animal (cat) (dog) hair and dander: Secondary | ICD-10-CM | POA: Diagnosis not present

## 2024-07-14 DIAGNOSIS — J301 Allergic rhinitis due to pollen: Secondary | ICD-10-CM | POA: Diagnosis not present

## 2024-07-14 DIAGNOSIS — J3089 Other allergic rhinitis: Secondary | ICD-10-CM | POA: Diagnosis not present

## 2024-07-18 ENCOUNTER — Other Ambulatory Visit: Payer: Self-pay | Admitting: Medical Genetics

## 2024-07-18 DIAGNOSIS — Z006 Encounter for examination for normal comparison and control in clinical research program: Secondary | ICD-10-CM

## 2024-07-25 DIAGNOSIS — H43393 Other vitreous opacities, bilateral: Secondary | ICD-10-CM | POA: Diagnosis not present

## 2024-07-27 DIAGNOSIS — J3089 Other allergic rhinitis: Secondary | ICD-10-CM | POA: Diagnosis not present

## 2024-07-27 DIAGNOSIS — J3081 Allergic rhinitis due to animal (cat) (dog) hair and dander: Secondary | ICD-10-CM | POA: Diagnosis not present

## 2024-07-27 DIAGNOSIS — J301 Allergic rhinitis due to pollen: Secondary | ICD-10-CM | POA: Diagnosis not present

## 2024-08-08 DIAGNOSIS — J3081 Allergic rhinitis due to animal (cat) (dog) hair and dander: Secondary | ICD-10-CM | POA: Diagnosis not present

## 2024-08-08 DIAGNOSIS — J301 Allergic rhinitis due to pollen: Secondary | ICD-10-CM | POA: Diagnosis not present

## 2024-08-09 DIAGNOSIS — J3089 Other allergic rhinitis: Secondary | ICD-10-CM | POA: Diagnosis not present

## 2024-08-11 DIAGNOSIS — J3081 Allergic rhinitis due to animal (cat) (dog) hair and dander: Secondary | ICD-10-CM | POA: Diagnosis not present

## 2024-08-11 DIAGNOSIS — J301 Allergic rhinitis due to pollen: Secondary | ICD-10-CM | POA: Diagnosis not present

## 2024-08-11 DIAGNOSIS — J3089 Other allergic rhinitis: Secondary | ICD-10-CM | POA: Diagnosis not present

## 2024-08-22 DIAGNOSIS — J3089 Other allergic rhinitis: Secondary | ICD-10-CM | POA: Diagnosis not present

## 2024-08-22 DIAGNOSIS — J301 Allergic rhinitis due to pollen: Secondary | ICD-10-CM | POA: Diagnosis not present

## 2024-08-22 DIAGNOSIS — J3081 Allergic rhinitis due to animal (cat) (dog) hair and dander: Secondary | ICD-10-CM | POA: Diagnosis not present

## 2024-08-29 DIAGNOSIS — J3089 Other allergic rhinitis: Secondary | ICD-10-CM | POA: Diagnosis not present

## 2024-08-29 DIAGNOSIS — J3081 Allergic rhinitis due to animal (cat) (dog) hair and dander: Secondary | ICD-10-CM | POA: Diagnosis not present

## 2024-08-29 DIAGNOSIS — J301 Allergic rhinitis due to pollen: Secondary | ICD-10-CM | POA: Diagnosis not present

## 2024-09-05 DIAGNOSIS — J3089 Other allergic rhinitis: Secondary | ICD-10-CM | POA: Diagnosis not present

## 2024-09-05 DIAGNOSIS — J301 Allergic rhinitis due to pollen: Secondary | ICD-10-CM | POA: Diagnosis not present

## 2024-09-05 DIAGNOSIS — J3081 Allergic rhinitis due to animal (cat) (dog) hair and dander: Secondary | ICD-10-CM | POA: Diagnosis not present

## 2024-09-08 ENCOUNTER — Other Ambulatory Visit: Payer: Self-pay | Admitting: Family Medicine

## 2024-09-08 DIAGNOSIS — F419 Anxiety disorder, unspecified: Secondary | ICD-10-CM

## 2024-09-26 DIAGNOSIS — J301 Allergic rhinitis due to pollen: Secondary | ICD-10-CM | POA: Diagnosis not present

## 2024-09-26 DIAGNOSIS — J3081 Allergic rhinitis due to animal (cat) (dog) hair and dander: Secondary | ICD-10-CM | POA: Diagnosis not present

## 2024-09-26 DIAGNOSIS — J3089 Other allergic rhinitis: Secondary | ICD-10-CM | POA: Diagnosis not present

## 2024-10-24 ENCOUNTER — Ambulatory Visit: Payer: Self-pay

## 2024-10-24 ENCOUNTER — Other Ambulatory Visit: Payer: Self-pay | Admitting: Obstetrics and Gynecology

## 2024-10-24 DIAGNOSIS — Z1231 Encounter for screening mammogram for malignant neoplasm of breast: Secondary | ICD-10-CM

## 2024-10-24 NOTE — Telephone Encounter (Signed)
 Noted- ok to close.
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
## 2024-11-07 ENCOUNTER — Ambulatory Visit
